# Patient Record
Sex: Male | Born: 1945 | Race: Black or African American | Hispanic: No | Marital: Single | Smoking: Never smoker
Health system: Southern US, Community
[De-identification: ages and names within clinical notes are randomized; demographics above are authoritative.]

## PROBLEM LIST (undated history)

## (undated) DIAGNOSIS — E119 Type 2 diabetes mellitus without complications: Secondary | ICD-10-CM

## (undated) DIAGNOSIS — D573 Sickle-cell trait: Secondary | ICD-10-CM

## (undated) DIAGNOSIS — I10 Essential (primary) hypertension: Secondary | ICD-10-CM

## (undated) DIAGNOSIS — I739 Peripheral vascular disease, unspecified: Secondary | ICD-10-CM

## (undated) DIAGNOSIS — M170 Bilateral primary osteoarthritis of knee: Secondary | ICD-10-CM

## (undated) HISTORY — DX: Essential (primary) hypertension: I10

## (undated) HISTORY — DX: Sickle-cell trait: D57.3

## (undated) HISTORY — DX: Bilateral primary osteoarthritis of knee: M17.0

---

## 2005-07-12 ENCOUNTER — Emergency Department (HOSPITAL_COMMUNITY): Admission: EM | Admit: 2005-07-12 | Discharge: 2005-07-12 | Payer: Self-pay | Admitting: Emergency Medicine

## 2007-03-06 ENCOUNTER — Emergency Department (HOSPITAL_COMMUNITY): Admission: EM | Admit: 2007-03-06 | Discharge: 2007-03-06 | Payer: Self-pay | Admitting: Emergency Medicine

## 2010-02-20 ENCOUNTER — Emergency Department (HOSPITAL_COMMUNITY): Admission: EM | Admit: 2010-02-20 | Discharge: 2010-02-20 | Payer: Self-pay | Admitting: Emergency Medicine

## 2011-06-03 ENCOUNTER — Emergency Department (HOSPITAL_COMMUNITY)
Admission: EM | Admit: 2011-06-03 | Discharge: 2011-06-04 | Disposition: A | Discharge status: Home / Self Care | Payer: Medicare Other | Attending: Emergency Medicine | Admitting: Emergency Medicine

## 2011-06-03 DIAGNOSIS — S61209A Unspecified open wound of unspecified finger without damage to nail, initial encounter: Secondary | ICD-10-CM | POA: Insufficient documentation

## 2011-06-03 DIAGNOSIS — W208XXA Other cause of strike by thrown, projected or falling object, initial encounter: Secondary | ICD-10-CM | POA: Insufficient documentation

## 2011-06-03 DIAGNOSIS — M79609 Pain in unspecified limb: Secondary | ICD-10-CM | POA: Insufficient documentation

## 2011-06-04 ENCOUNTER — Emergency Department (HOSPITAL_COMMUNITY): Payer: Medicare Other

## 2011-06-04 IMAGING — CR DG FINGER RING 2+V*R*
3 series · 3 of 3 positions shown · non-contrast
Comparison: None.

CLINICAL DATA: Blunt trauma of the fourth finger

RIGHT RING FINGER 2+V

[x finger pa right]
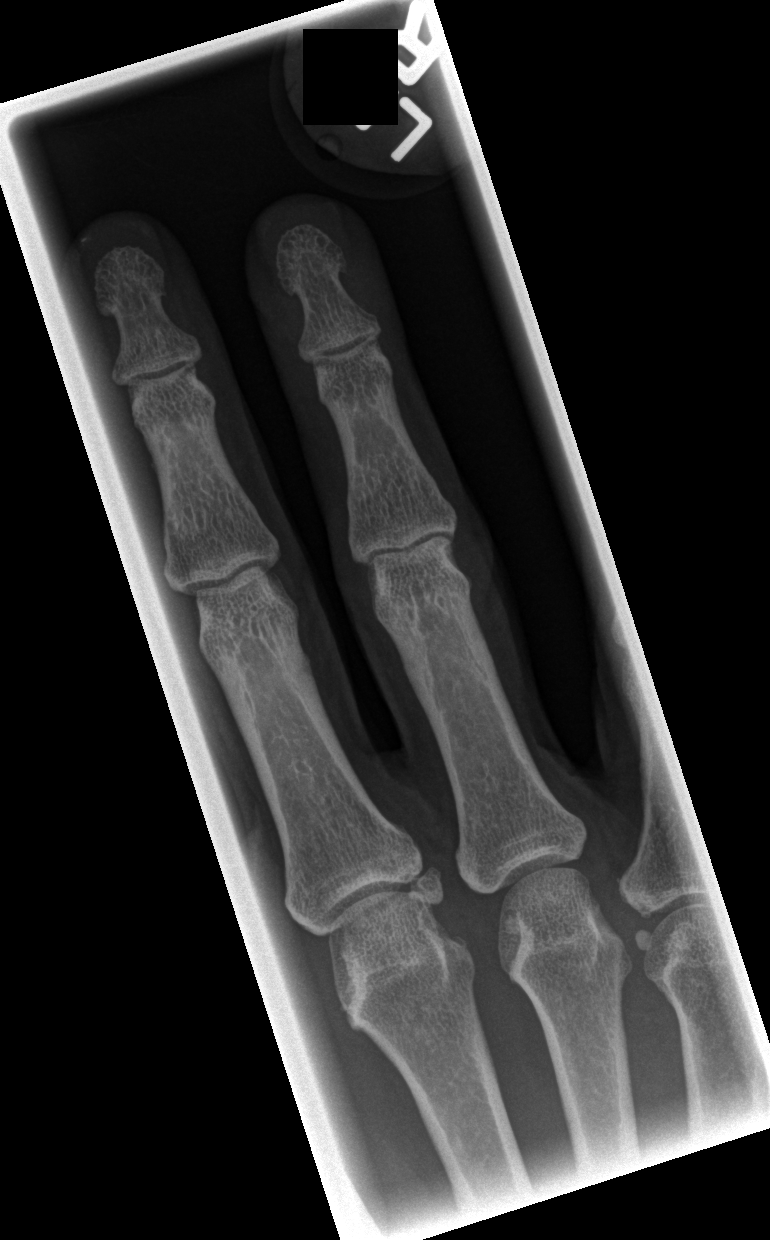

[x finger obl. right]
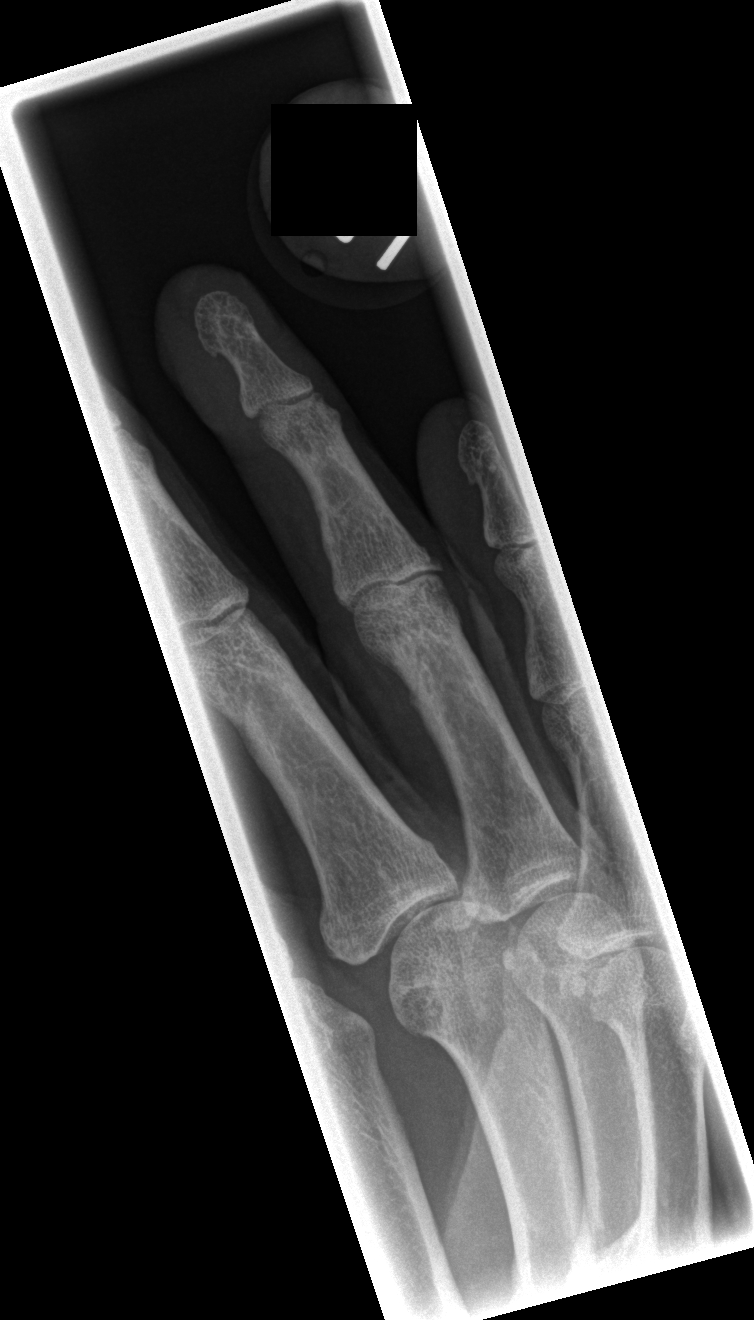

[x finger lateral right]
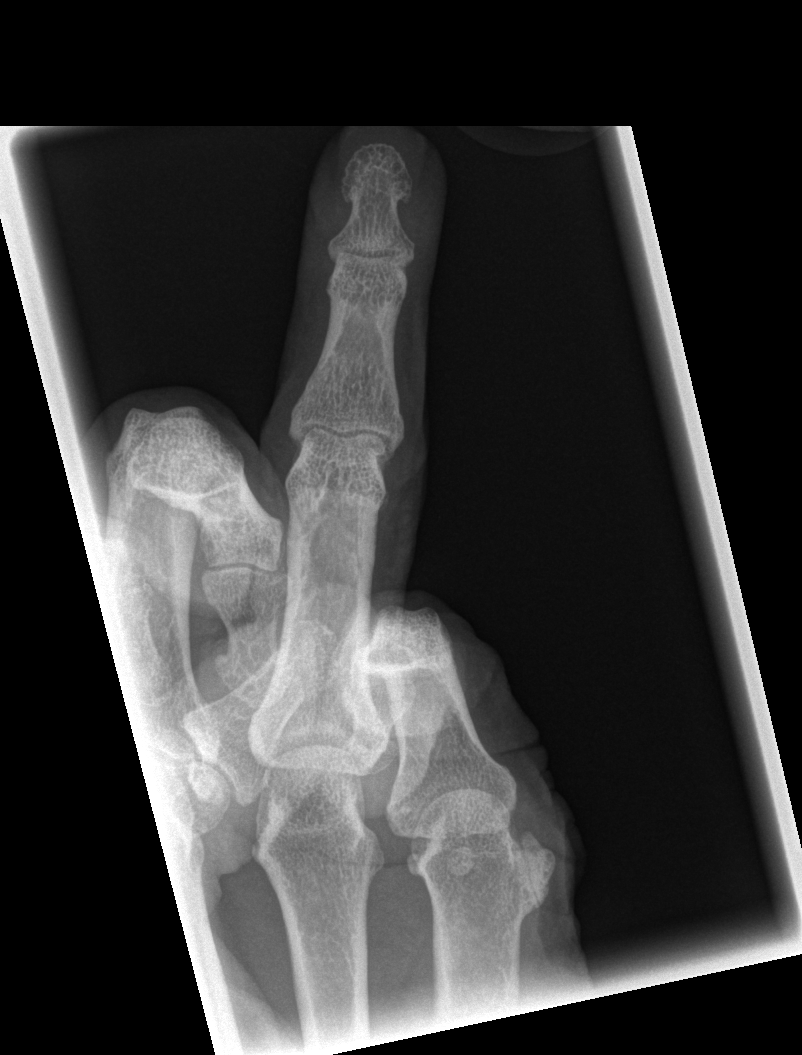

[3 of 3 positions shown; findings below may reference images not displayed]

FINDINGS: The joints of the finger are aligned.  No acute fracture
is identified.  No discrete focal soft tissue swelling, radiopaque
foreign body or soft tissue gas.
IMPRESSION: No acute bony abnormality identified.

## 2015-07-26 ENCOUNTER — Emergency Department (HOSPITAL_COMMUNITY)
Admission: EM | Admit: 2015-07-26 | Discharge: 2015-07-26 | Disposition: A | Discharge status: Home / Self Care | Payer: Medicare HMO | Attending: Emergency Medicine | Admitting: Emergency Medicine

## 2015-07-26 ENCOUNTER — Emergency Department (HOSPITAL_COMMUNITY): Payer: Medicare HMO

## 2015-07-26 ENCOUNTER — Encounter (HOSPITAL_COMMUNITY): Payer: Self-pay | Admitting: Emergency Medicine

## 2015-07-26 DIAGNOSIS — J069 Acute upper respiratory infection, unspecified: Secondary | ICD-10-CM | POA: Insufficient documentation

## 2015-07-26 DIAGNOSIS — R300 Dysuria: Secondary | ICD-10-CM | POA: Diagnosis present

## 2015-07-26 DIAGNOSIS — N390 Urinary tract infection, site not specified: Secondary | ICD-10-CM | POA: Diagnosis not present

## 2015-07-26 LAB — CBC
HCT: 42 % (ref 39.0–52.0)
HEMOGLOBIN: 13.4 g/dL (ref 13.0–17.0)
MCH: 28.3 pg (ref 26.0–34.0)
MCHC: 31.9 g/dL (ref 30.0–36.0)
MCV: 88.6 fL (ref 78.0–100.0)
Platelets: 270 10*3/uL (ref 150–400)
RBC: 4.74 MIL/uL (ref 4.22–5.81)
RDW: 12.8 % (ref 11.5–15.5)
WBC: 24.5 10*3/uL — AB (ref 4.0–10.5)

## 2015-07-26 LAB — URINALYSIS, ROUTINE W REFLEX MICROSCOPIC
Bilirubin Urine: NEGATIVE
Glucose, UA: NEGATIVE mg/dL
Ketones, ur: NEGATIVE mg/dL
Nitrite: NEGATIVE
Protein, ur: 30 mg/dL — AB
Specific Gravity, Urine: 1.018 (ref 1.005–1.030)
pH: 5.5 (ref 5.0–8.0)

## 2015-07-26 LAB — BASIC METABOLIC PANEL
ANION GAP: 11 (ref 5–15)
BUN: 13 mg/dL (ref 6–20)
CHLORIDE: 100 mmol/L — AB (ref 101–111)
CO2: 27 mmol/L (ref 22–32)
Calcium: 8.9 mg/dL (ref 8.9–10.3)
Creatinine, Ser: 1.37 mg/dL — ABNORMAL HIGH (ref 0.61–1.24)
GFR calc non Af Amer: 51 mL/min — ABNORMAL LOW (ref >60)
GFR, EST AFRICAN AMERICAN: 59 mL/min — AB (ref >60)
Glucose, Bld: 125 mg/dL — ABNORMAL HIGH (ref 65–99)
Potassium: 3.7 mmol/L (ref 3.5–5.1)
SODIUM: 138 mmol/L (ref 135–145)

## 2015-07-26 LAB — URINE MICROSCOPIC-ADD ON

## 2015-07-26 IMAGING — CR DG CHEST 2V
2 series · 2 of 2 positions shown · non-contrast
Comparison: None.

CLINICAL DATA: Productive cough

EXAM:
CHEST  2 VIEW

[w chest pa]
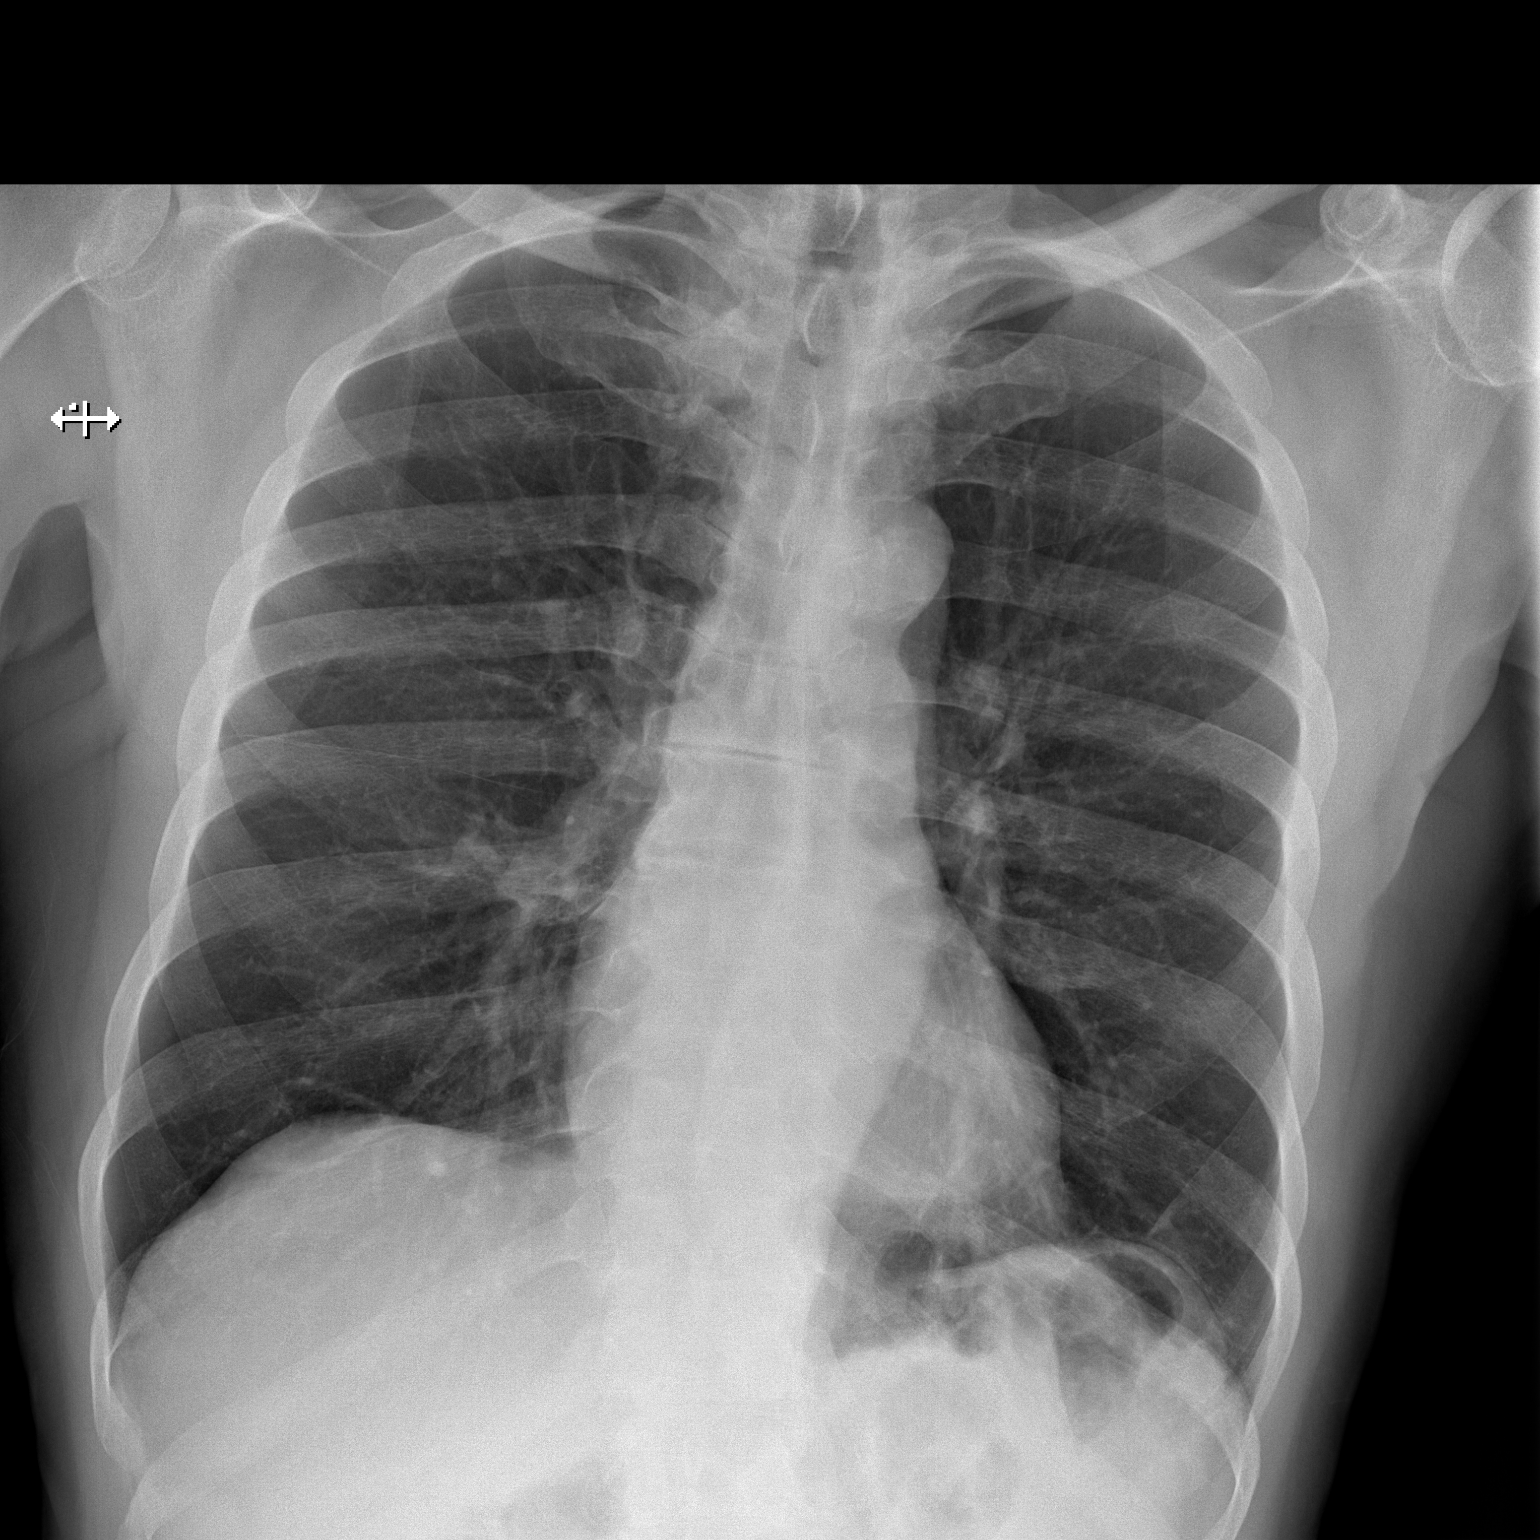

[w chest lat]
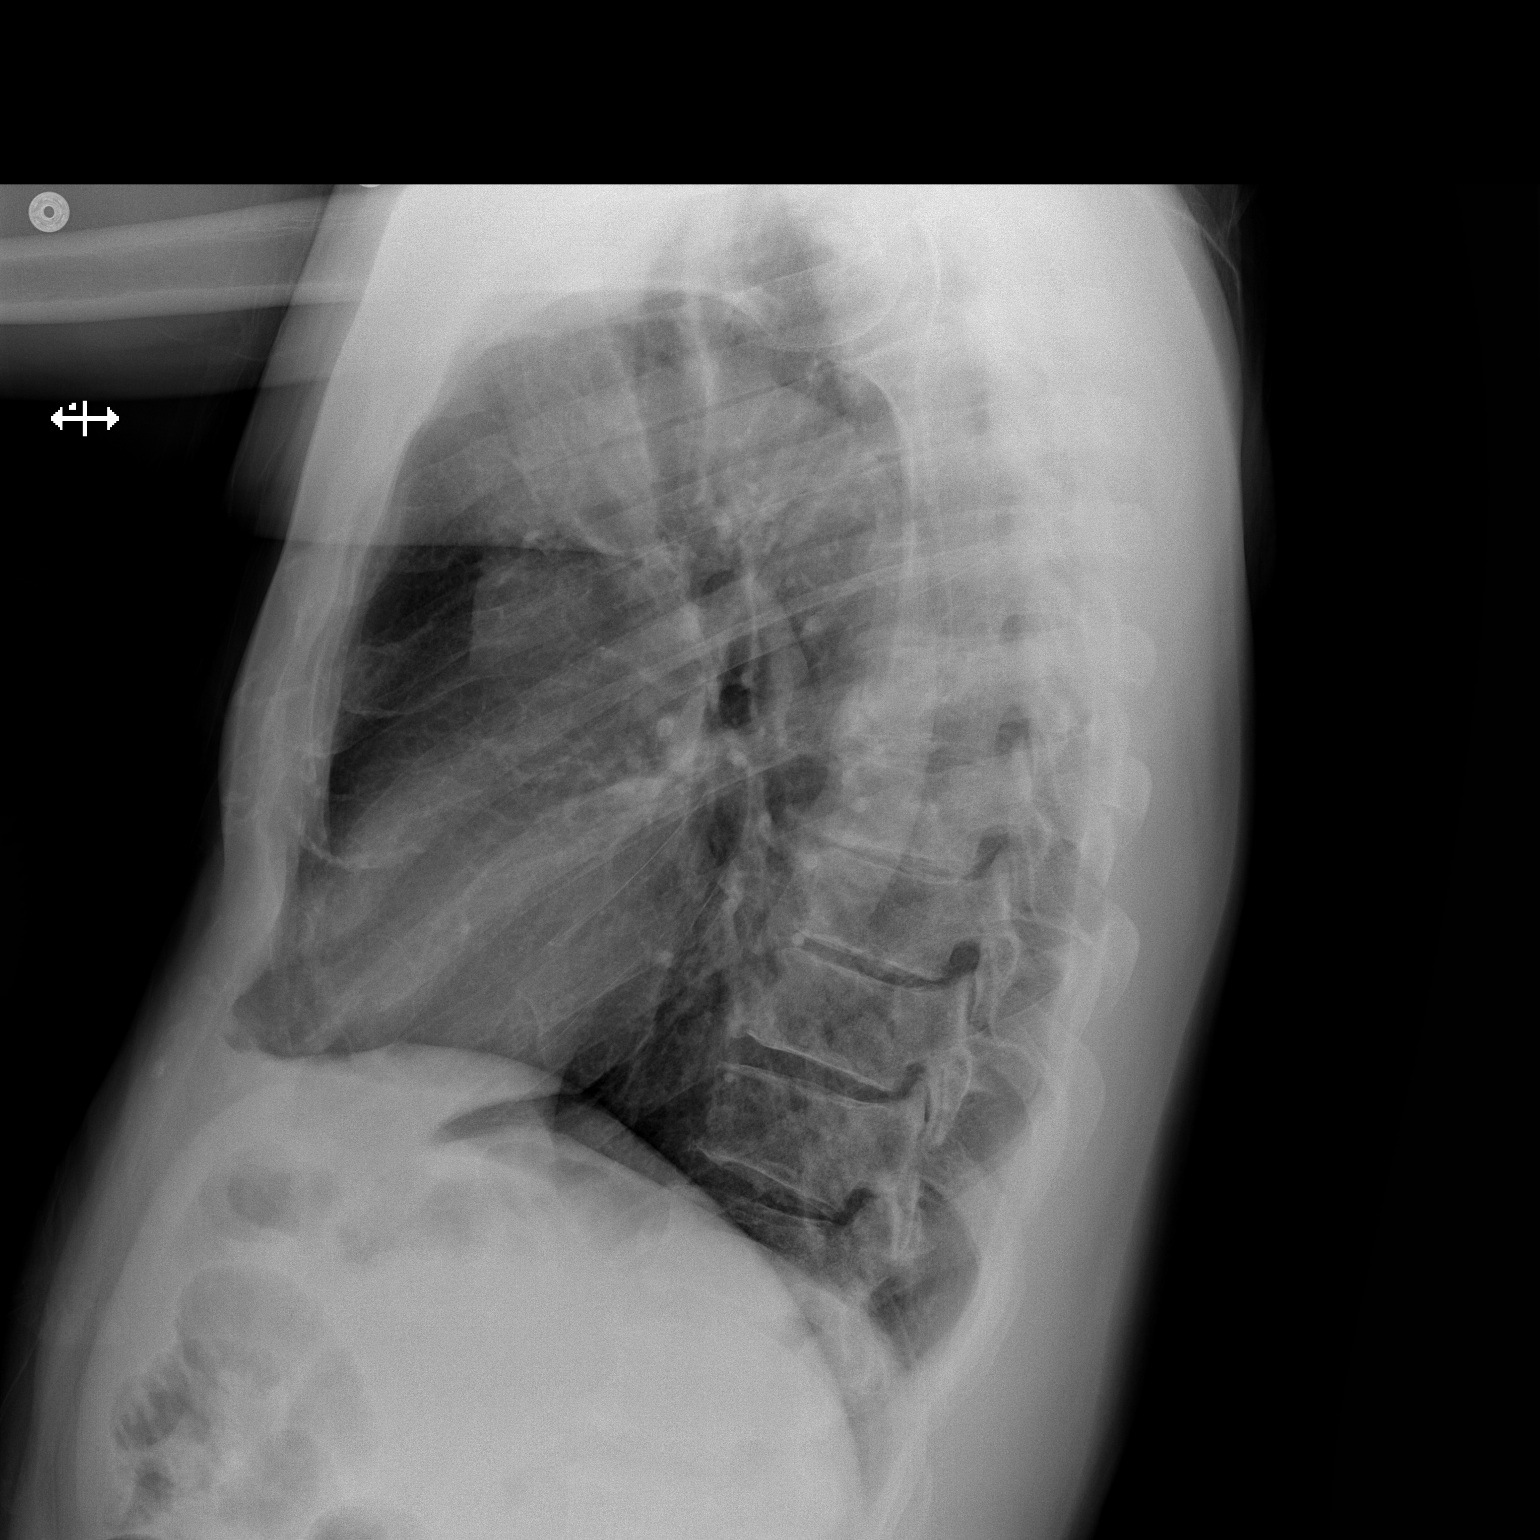

[2 of 2 positions shown; findings below may reference images not displayed]

FINDINGS: The heart size and mediastinal contours are within normal limits.
Both lungs are clear. The visualized skeletal structures are
unremarkable.
IMPRESSION: No active cardiopulmonary disease.

## 2015-07-26 MED ORDER — PROMETHAZINE-DM 6.25-15 MG/5ML PO SYRP
5.0000 mL | ORAL_SOLUTION | Freq: Four times a day (QID) | ORAL | Status: DC | PRN
Start: 2015-07-26 — End: 2017-12-21

## 2015-07-26 MED ORDER — DEXTROSE 5 % IV SOLN
1.0000 g | Freq: Once | INTRAVENOUS | Status: AC
  Administered 2015-07-26: 1 g via INTRAVENOUS
  Filled 2015-07-26: qty 10

## 2015-07-26 MED ORDER — GUAIFENESIN ER 1200 MG PO TB12: 1.0000 | ORAL_TABLET | Freq: Two times a day (BID) | ORAL | Status: DC

## 2015-07-26 MED ORDER — LEVOFLOXACIN 750 MG PO TABS: 750.0000 mg | ORAL_TABLET | Freq: Every day | ORAL | Status: DC

## 2015-07-26 NOTE — Discharge Instructions (Signed)
Return here as needed.  Follow-up with your primary care doctor, increase your fluid intake and rest as much as possible °

## 2015-07-26 NOTE — ED Notes (Signed)
Family at bedside. 

## 2015-07-26 NOTE — ED Provider Notes (Signed)
CSN: [id number]     Arrival date & time 07/26/15  1456 History   First MD Initiated Contact with Patient 07/26/15 1543     Chief Complaint  Patient presents with  . Dysuria     (Consider location/radiation/quality/duration/timing/severity/associated sxs/prior Treatment) HPI Patient presents to the emergency department with upper respiratory illness that includes cough, nasal congestion, and chills.  The patient states he also has some incontinence and dysuria.  Patient states that he has pain with urination.  Also, states that he did not take any medications prior to arrival.  He states nothing seems make his condition, better or worse.  The patient denies chest pain, shortness of breath, nausea, vomiting, weakness, dizziness, headache, blurred vision, abdominal pain, back pain, rash, lightheadedness, anorexia, numbness, or syncope History reviewed. No pertinent past medical history. History reviewed. No pertinent past surgical history. No family history on file. Social History  Substance Use Topics  . Smoking status: Never Smoker   . Smokeless tobacco: None  . Alcohol Use: No    Review of Systems  All other systems negative except as documented in the HPI. All pertinent positives and negatives as reviewed in the HPI.  Allergies  Review of patient's allergies indicates no known allergies.  Home Medications   Prior to Admission medications   Medication Sig Start Date End Date Taking? Authorizing Provider  cyclobenzaprine (FLEXERIL) 10 MG tablet TK 1 T PO BID PRN P 07/04/15  Yes Historical Provider, MD  HYDROcodone-ibuprofen (VICOPROFEN) 7.5-200 MG tablet TK 1 T PO QID PRN P 07/04/15  Yes Historical Provider, MD  ibuprofen (ADVIL,MOTRIN) 200 MG tablet Take 400 mg by mouth every 6 (six) hours as needed for moderate pain.   Yes Historical Provider, MD   BP 116/54 mmHg  Pulse 66  Temp(Src) 98 F (36.7 C) (Oral)  Resp 20  SpO2 96% Physical Exam  Constitutional: He is oriented  to person, place, and time. He appears well-developed and well-nourished. No distress.  HENT:  Head: Normocephalic and atraumatic.  Mouth/Throat: Oropharynx is clear and moist.  Eyes: Pupils are equal, round, and reactive to light.  Neck: Normal range of motion. Neck supple.  Cardiovascular: Normal rate, regular rhythm and normal heart sounds.  Exam reveals no gallop and no friction rub.   No murmur heard. Pulmonary/Chest: Effort normal and breath sounds normal. No respiratory distress. He has no wheezes.  Abdominal: Soft. Bowel sounds are normal. He exhibits no distension. There is no tenderness.  Neurological: He is alert and oriented to person, place, and time. He exhibits normal muscle tone. Coordination normal.  Skin: Skin is warm and dry. No rash noted. No erythema.  Psychiatric: He has a normal mood and affect. His behavior is normal.  Nursing note and vitals reviewed.   ED Course  Procedures (including critical care time) Labs Review Labs Reviewed  BASIC METABOLIC PANEL - Abnormal; Notable for the following:    Chloride 100 (*)    Glucose, Bld 125 (*)    Creatinine, Ser 1.37 (*)    GFR calc non Af Amer 51 (*)    GFR calc Af Amer 59 (*)    All other components within normal limits  CBC - Abnormal; Notable for the following:    WBC 24.5 (*)    All other components within normal limits  URINALYSIS, ROUTINE W REFLEX MICROSCOPIC (NOT AT [facility]) - Abnormal; Notable for the following:    Color, Urine AMBER (*)    APPearance TURBID (*)    Hgb  urine dipstick LARGE (*)    Protein, ur 30 (*)    Leukocytes, UA LARGE (*)    All other components within normal limits  URINE MICROSCOPIC-ADD ON - Abnormal; Notable for the following:    Squamous Epithelial / LPF 0-5 (*)    Bacteria, UA FEW (*)    All other components within normal limits  URINE CULTURE    Imaging Review Dg Chest 2 View  07/26/2015  CLINICAL DATA:  Productive cough EXAM: CHEST  2 VIEW COMPARISON:  None. FINDINGS:  The heart size and mediastinal contours are within normal limits. Both lungs are clear. The visualized skeletal structures are unremarkable. IMPRESSION: No active cardiopulmonary disease. Electronically Signed   By: Marlan Palauharles  Clark M.D.   On: 07/26/2015 16:47   I have personally reviewed and evaluated these images and lab results as part of my medical decision-making.  Patient be treated for his upper respiratory infection along with his urine.  The patient is advised to return here as needed.  Told to increase his fluid intake and rest as much as possible.  Patient agrees the plan and all questions were answered   Charlestine NightChristopher Yaileen Hofferber, PA-C 07/26/15 1855  Mancel BaleElliott Wentz, MD 07/26/15 [phone]

## 2015-07-26 NOTE — ED Provider Notes (Signed)
  Face-to-face evaluation   History: He has been ill for several days with nonproductive cough, and urinary incontinence. No dysuria, hematuria, weakness or dizziness. He has had a low-grade temperature. No prior similar problems.  Physical exam: Alert, vigorous elderly male. Heart regular rate and rhythm, no murmur. Lungs clear. Abdomen soft, nontender. Back no costovertebral angle tenderness  Medical screening examination/treatment/procedure(s) were conducted as a shared visit with non-physician practitioner(s) and myself.  I personally evaluated the patient during the encounter  Mancel BaleElliott Shawnette Augello, MD 07/26/15 [phone]

## 2015-07-26 NOTE — ED Notes (Signed)
Per pt, states cold symptoms and urinary incontinence for 2 days

## 2015-07-28 LAB — URINE CULTURE: Culture: >=100000

## 2015-07-30 ENCOUNTER — Telehealth (HOSPITAL_COMMUNITY): Payer: Self-pay

## 2015-07-30 NOTE — Telephone Encounter (Signed)
Post ED Visit - Positive Culture Follow-up  Culture report reviewed by antimicrobial stewardship pharmacist:  []  Enzo BiNathan Batchelder, Pharm.D. []  Celedonio MiyamotoJeremy Frens, Pharm.D., BCPS []  Garvin FilaMike Maccia, Pharm.D. []  Georgina PillionElizabeth Martin, Pharm.D., BCPS []  RousevilleMinh Pham, [address] BoulevardPharm.D., BCPS, AAHIVP []  Estella HuskMichelle Turner, Pharm.D., BCPS, AAHIVP [x]  Tennis Mustassie Stewart, Pharm.D. []  Sherle Poeob Vincent, [place]arm[place]D.  Positive urine culture, >/=100,000 colonies -> E Coli Treated with Levofloxacin, organism sensitive to the same and no further patient follow-up is required at this time.  Arvid RightClark, Murlene Revell Dorn 07/30/2015, 3:19 AM

## 2016-08-16 ENCOUNTER — Emergency Department (HOSPITAL_COMMUNITY): Payer: Medicare HMO

## 2016-08-16 ENCOUNTER — Emergency Department (HOSPITAL_COMMUNITY)
Admission: EM | Admit: 2016-08-16 | Discharge: 2016-08-16 | Disposition: A | Discharge status: Home / Self Care | Payer: Medicare HMO | Attending: Emergency Medicine | Admitting: Emergency Medicine

## 2016-08-16 ENCOUNTER — Encounter (HOSPITAL_COMMUNITY): Payer: Self-pay | Admitting: Emergency Medicine

## 2016-08-16 DIAGNOSIS — Y999 Unspecified external cause status: Secondary | ICD-10-CM | POA: Insufficient documentation

## 2016-08-16 DIAGNOSIS — Y929 Unspecified place or not applicable: Secondary | ICD-10-CM | POA: Insufficient documentation

## 2016-08-16 DIAGNOSIS — X501XXA Overexertion from prolonged static or awkward postures, initial encounter: Secondary | ICD-10-CM | POA: Insufficient documentation

## 2016-08-16 DIAGNOSIS — S8992XA Unspecified injury of left lower leg, initial encounter: Secondary | ICD-10-CM | POA: Diagnosis present

## 2016-08-16 DIAGNOSIS — Y939 Activity, unspecified: Secondary | ICD-10-CM | POA: Diagnosis not present

## 2016-08-16 DIAGNOSIS — S8392XA Sprain of unspecified site of left knee, initial encounter: Secondary | ICD-10-CM

## 2016-08-16 IMAGING — CR DG KNEE COMPLETE 4+V*L*
4 series · 4 of 4 positions shown · non-contrast
Comparison: None.

CLINICAL DATA: Patient status post fall with knee pain. Initial
encounter.

EXAM:
LEFT KNEE - COMPLETE 4+ VIEW

[t knee ap left]
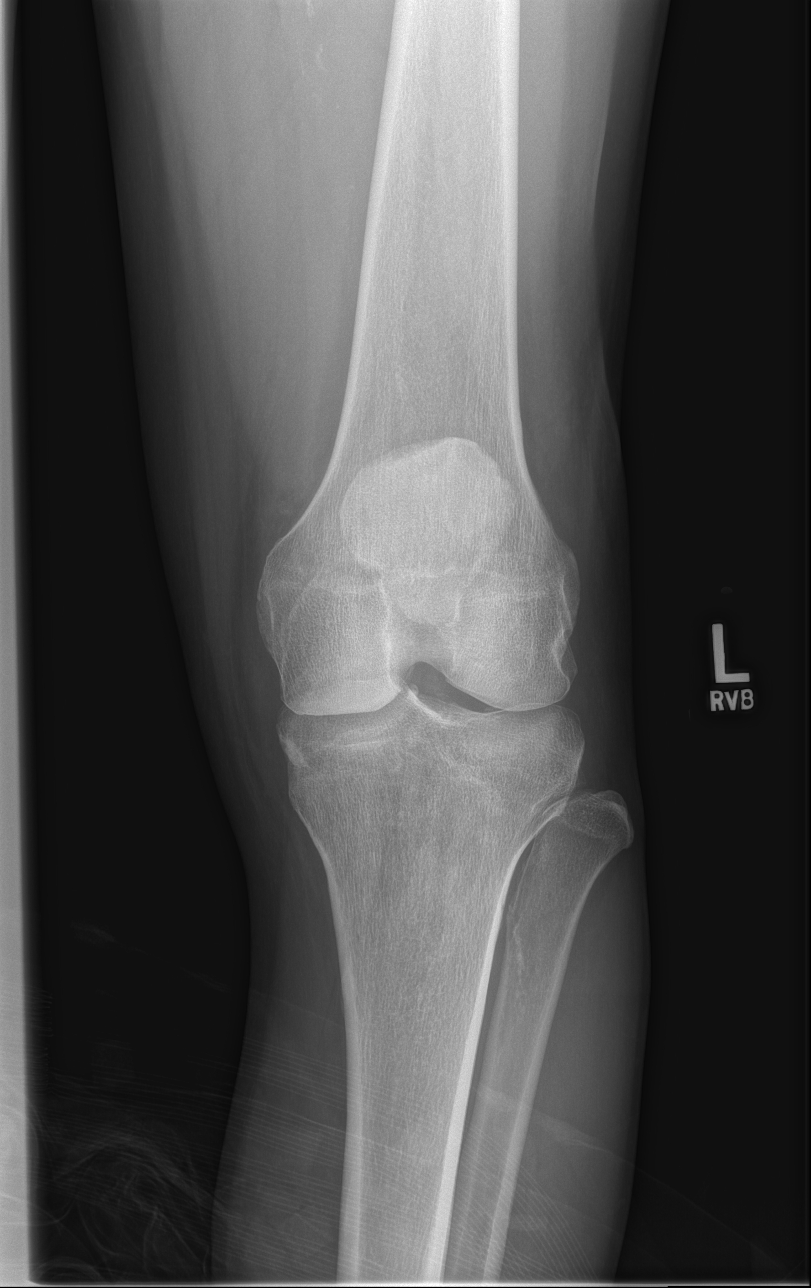

[t knee obl left (1 of 2)]
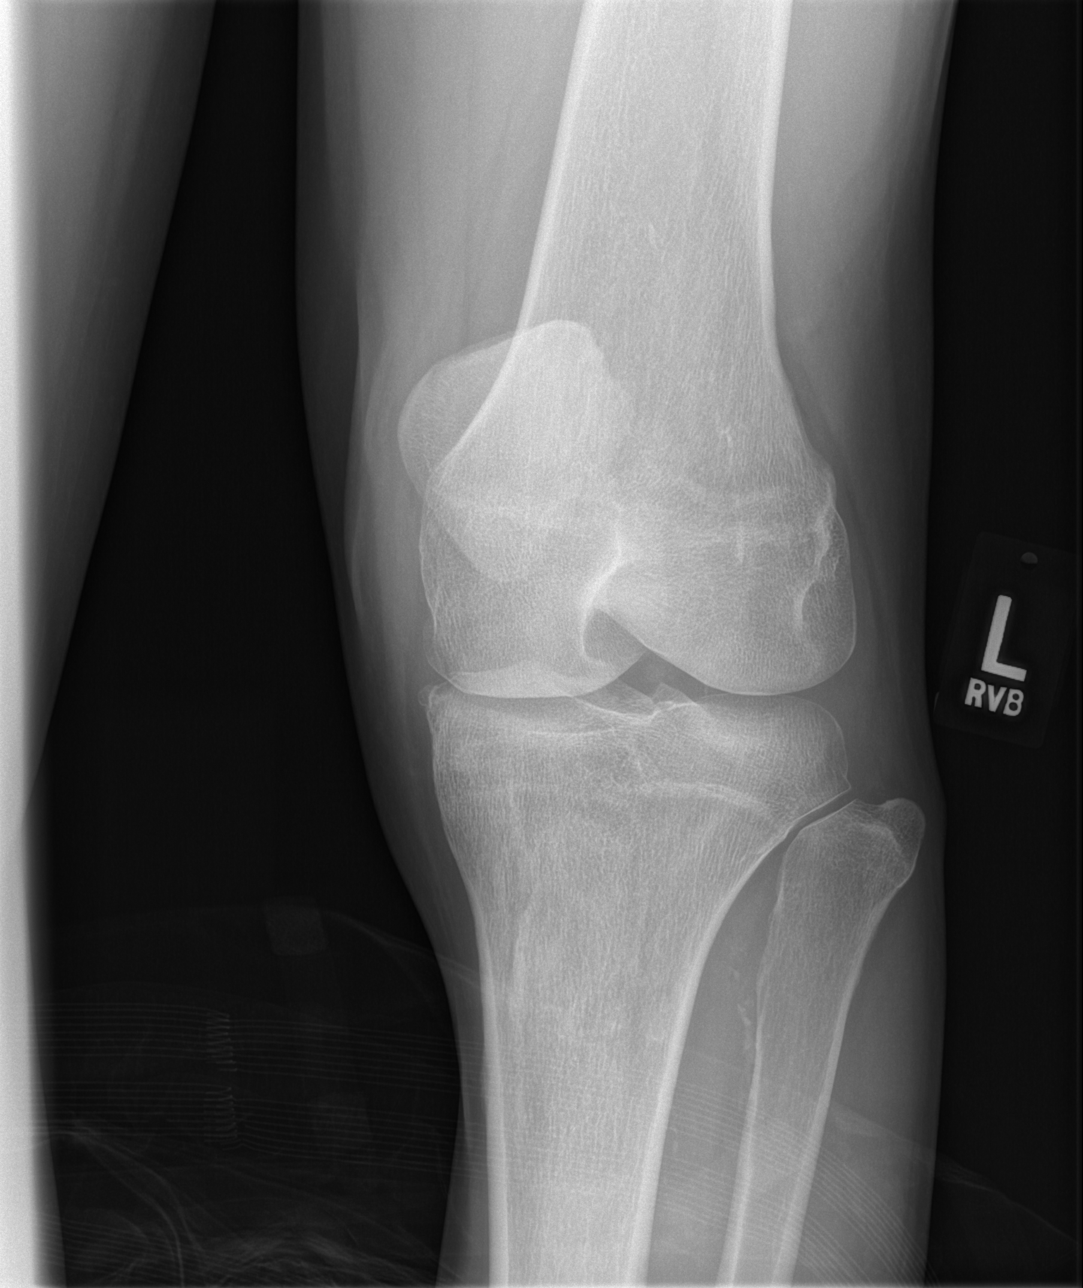

[t knee obl left (2 of 2)]
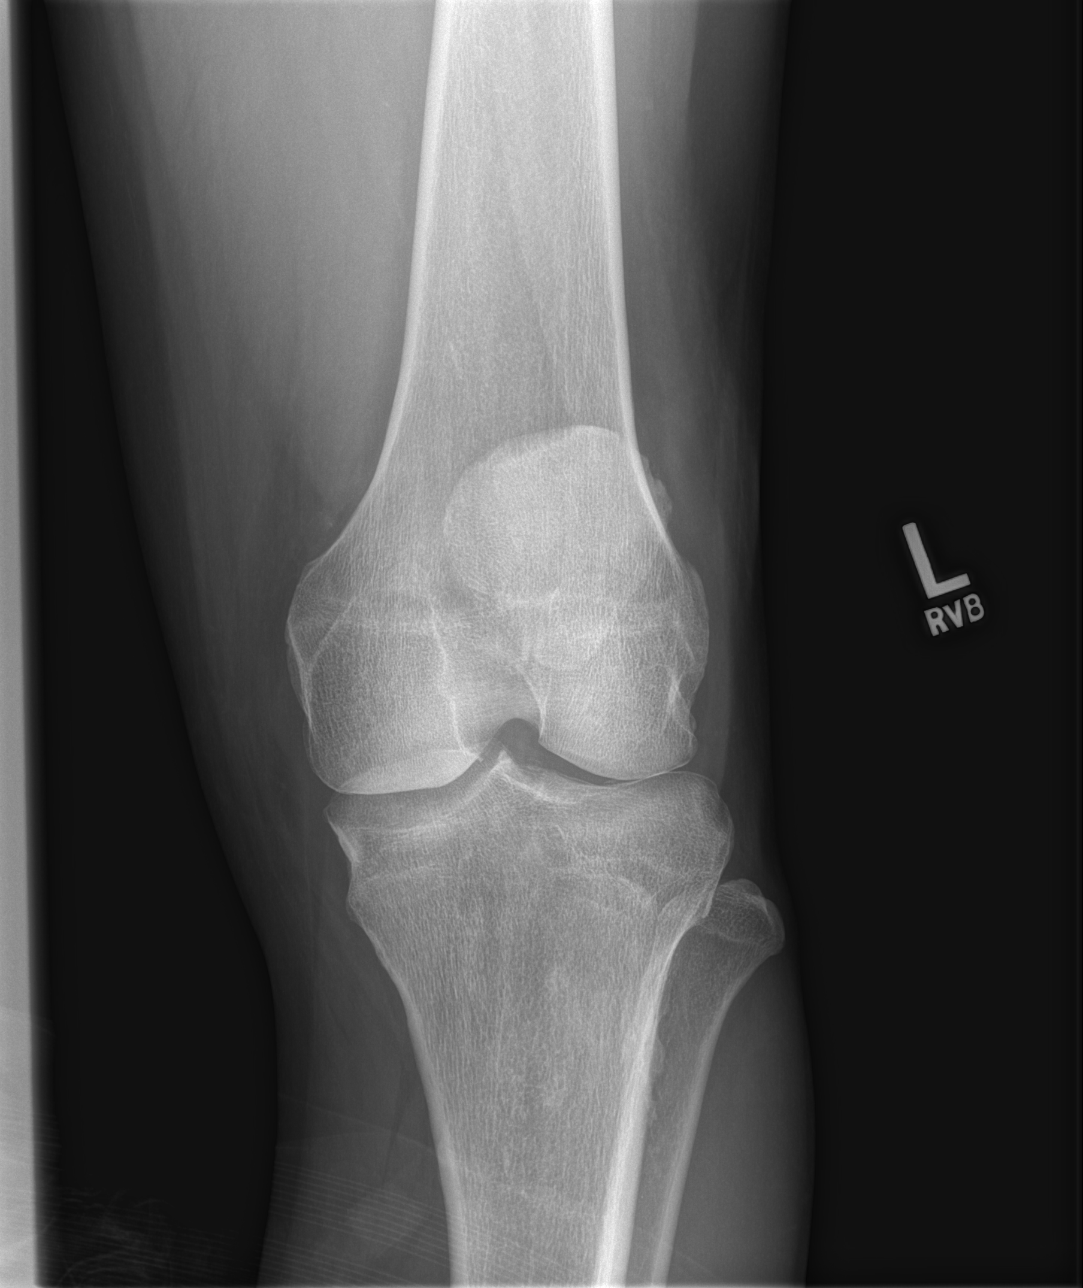

[t knee lat left]
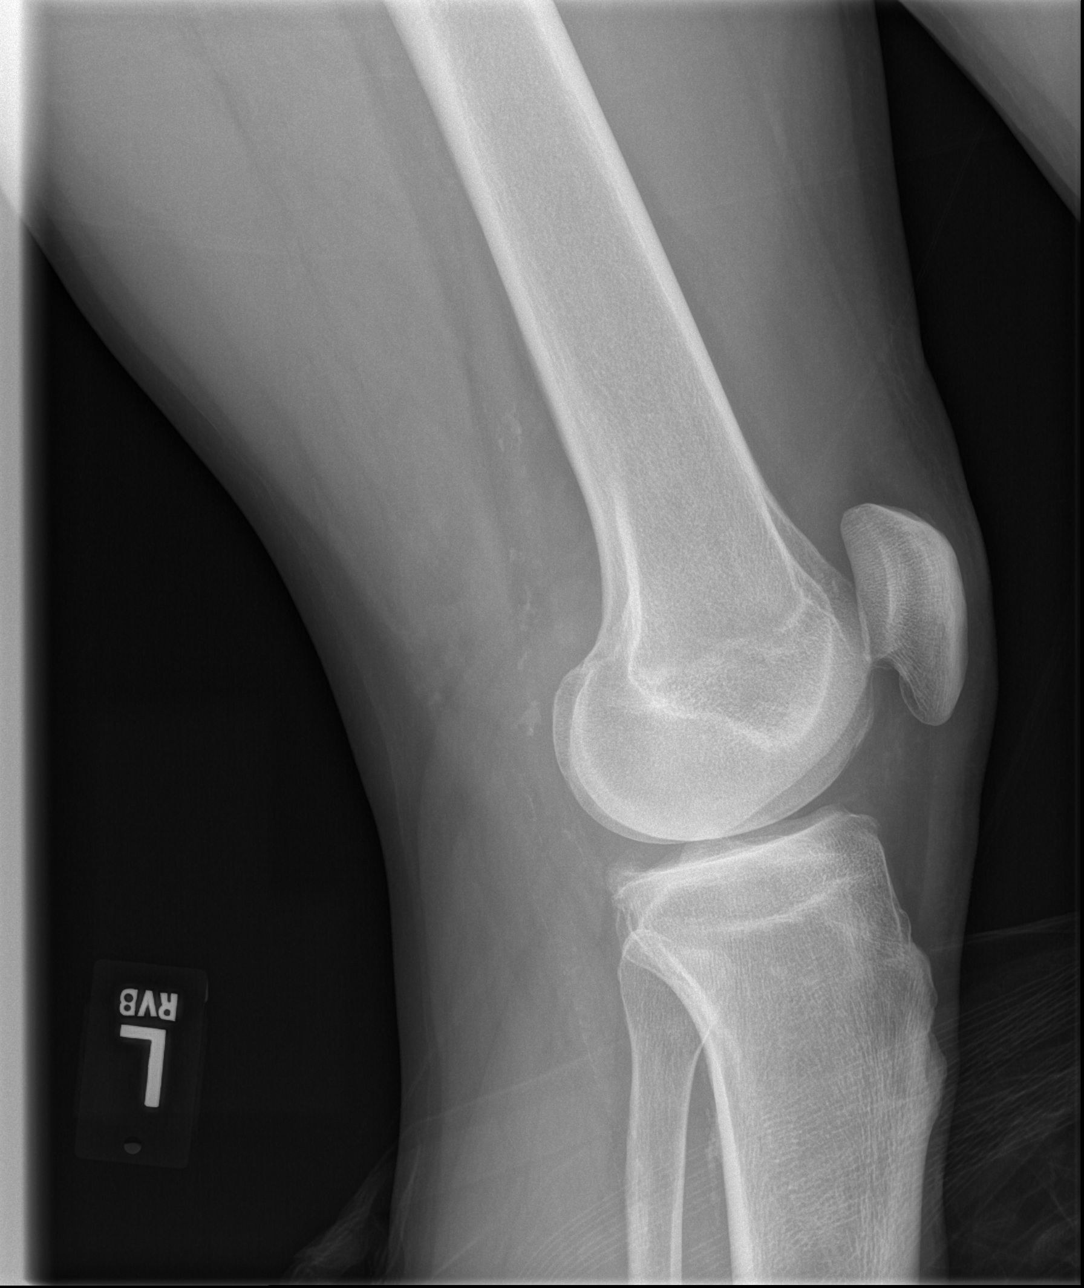

[4 of 4 positions shown; findings below may reference images not displayed]

FINDINGS: Normal anatomic alignment. Medial compartment joint space narrowing.
No evidence for acute fracture or dislocation. No joint effusion.
Vascular calcifications.
IMPRESSION: Degenerative changes.  No acute osseous abnormality.

## 2016-08-16 MED ORDER — TRAMADOL HCL 50 MG PO TABS: 50.0000 mg | ORAL_TABLET | Freq: Four times a day (QID) | ORAL | 0 refills | Status: DC | PRN

## 2016-08-16 MED ORDER — CYCLOBENZAPRINE HCL 10 MG PO TABS: ORAL_TABLET | ORAL | 0 refills | Status: DC

## 2016-08-16 NOTE — ED Triage Notes (Signed)
Patient reports left knee pain after "twisting it on the job site" x2 days ago. Ambulatory to triage.

## 2016-08-16 NOTE — ED Provider Notes (Signed)
[facility]-EMERGENCY DEPT Provider Note   CSN: [id number] Arrival date & time: 08/16/16  1254  By signing my name below, I, Teofilo PodMatthew P. Jamison, attest that this documentation has been prepared under the direction and in the presence of Fayrene HelperBowie Bryden Darden, PA-C. Electronically Signed: Teofilo PodMatthew P. Jamison, ED Scribe. 08/16/2016. 1:08 PM.    History   Chief Complaint Chief Complaint  Patient presents with  . Knee Pain    The history is provided by the patient. No language interpreter was used.   HPI Comments:  Aaron Mendez is a 70 y.o. male who presents to the Emergency Department s/p left knee injury that occurred 2 days ago. Pt reports that he was standing on a bucket pulling nails out of a wall and the bucket slipped out from under him, causing him to twist his left knee. Pt did not hit his head. Pt rates that pain at 5/10 and describes the pain as sharp and constant. Pt has been ambulatory. Pt denies other injuries. Pt took advil and used rubbing alcohol with no relief. Pt denies lightheadedness, dizziness, chest pain, LOC, numbness, back pain.   History reviewed. No pertinent past medical history.  There are no active problems to display for this patient.   History reviewed. No pertinent surgical history.     Home Medications    Prior to Admission medications   Medication Sig Start Date End Date Taking? Authorizing Provider  cyclobenzaprine (FLEXERIL) 10 MG tablet TK 1 T PO BID PRN P 07/04/15   Historical Provider, MD  Guaifenesin 1200 MG TB12 Take 1 tablet (1,200 mg total) by mouth 2 (two) times daily. 07/26/15   Christopher Lawyer, PA-C  HYDROcodone-ibuprofen (VICOPROFEN) 7.5-200 MG tablet TK 1 T PO QID PRN P 07/04/15   Historical Provider, MD  ibuprofen (ADVIL,MOTRIN) 200 MG tablet Take 400 mg by mouth every 6 (six) hours as needed for moderate pain.    Historical Provider, MD  levofloxacin (LEVAQUIN) 750 MG tablet Take 1 tablet (750 mg total) by mouth daily. 07/26/15   Charlestine Nighthristopher  Lawyer, PA-C  promethazine-dextromethorphan (PROMETHAZINE-DM) 6.25-15 MG/5ML syrup Take 5 mLs by mouth 4 (four) times daily as needed for cough. 07/26/15   Charlestine Nighthristopher Lawyer, PA-C    Family History No family history on file.  Social History Social History  Substance Use Topics  . Smoking status: Never Smoker  . Smokeless tobacco: Not on file  . Alcohol use No     Allergies   Patient has no known allergies.   Review of Systems Review of Systems  Cardiovascular: Negative for chest pain.  Musculoskeletal: Positive for arthralgias. Negative for back pain.  Neurological: Negative for dizziness, syncope, light-headedness and numbness.     Physical Exam Updated Vital Signs BP 171/88 (BP Location: Left Arm)   Pulse (!) 53   Temp 97.9 F (36.6 C) (Oral)   Resp 18   Ht 5\' 11"  (1.803 m)   Wt 165 lb 4.8 oz (75 kg)   SpO2 100%   BMI 23.05 kg/m   Physical Exam  Constitutional: He appears well-developed and well-nourished. No distress.  HENT:  Head: Normocephalic and atraumatic.  Eyes: Conjunctivae are normal.  Cardiovascular: Normal rate.   Pulmonary/Chest: Effort normal.  Abdominal: He exhibits no distension.  Musculoskeletal:       Left knee: He exhibits effusion. He exhibits normal range of motion, no erythema, no LCL laxity and no MCL laxity. Tenderness found. Medial joint line tenderness noted.  No Baker's cyst. No warmth.   Neurological: He  is alert.  Skin: Skin is warm and dry.  Psychiatric: He has a normal mood and affect.  Nursing note and vitals reviewed.    ED Treatments / Results  DIAGNOSTIC STUDIES:  Oxygen Saturation is 100% on RA, normal by my interpretation.    COORDINATION OF CARE:  1:08 PM Discussed treatment plan with pt at bedside and pt agreed to plan.   Labs (all labs ordered are listed, but only abnormal results are displayed) Labs Reviewed - No data to display  EKG  EKG Interpretation None       Radiology Dg Knee Complete 4  Views Left  Result Date: 08/16/2016 CLINICAL DATA:  Patient status post fall with knee pain. Initial encounter. EXAM: LEFT KNEE - COMPLETE 4+ VIEW COMPARISON:  None. FINDINGS: Normal anatomic alignment. Medial compartment joint space narrowing. No evidence for acute fracture or dislocation. No joint effusion. Vascular calcifications. IMPRESSION: Degenerative changes.  No acute osseous abnormality. Electronically Signed   By: Annia Beltrew  Davis M.D.   On: 08/16/2016 13:29    Procedures Procedures (including critical care time)  Medications Ordered in ED Medications - No data to display   Initial Impression / Assessment and Plan / ED Course  Patient X-Ray negative for obvious fracture or dislocation.  Pt advised to follow up with orthopedics. Patient given knee sleeve while in ED, conservative therapy recommended and discussed. Patient will be discharged home & is agreeable with above plan. Returns precautions discussed. Pt appears safe for discharge.  I have reviewed the triage vital signs and the nursing notes.  Pertinent labs & imaging results that were available during my care of the patient were reviewed by me and considered in my medical decision making (see chart for details).  Clinical Course     BP 171/88 (BP Location: Left Arm)   Pulse (!) 53   Temp 97.9 F (36.6 C) (Oral)   Resp 18   Ht 5\' 11"  (1.803 m)   Wt 75 kg   SpO2 100%   BMI 23.05 kg/m    Final Clinical Impressions(s) / ED Diagnoses   Final diagnoses:  Sprain of left knee, unspecified ligament, initial encounter    New Prescriptions New Prescriptions   TRAMADOL (ULTRAM) 50 MG TABLET    Take 1 tablet (50 mg total) by mouth every 6 (six) hours as needed.   I personally performed the services described in this documentation, which was scribed in my presence. The recorded information has been reviewed and is accurate.       Fayrene HelperBowie Von Quintanar, PA-C 08/16/16 1342    Lorre NickAnthony Allen, MD 08/16/16 [phone]

## 2016-08-16 NOTE — ED Notes (Signed)
Patient transported to X-ray 

## 2016-12-22 ENCOUNTER — Ambulatory Visit (INDEPENDENT_AMBULATORY_CARE_PROVIDER_SITE_OTHER): Payer: Medicare PPO | Admitting: Orthopaedic Surgery

## 2016-12-22 ENCOUNTER — Ambulatory Visit (INDEPENDENT_AMBULATORY_CARE_PROVIDER_SITE_OTHER): Payer: Medicare PPO

## 2016-12-22 ENCOUNTER — Encounter (INDEPENDENT_AMBULATORY_CARE_PROVIDER_SITE_OTHER): Payer: Self-pay

## 2016-12-22 ENCOUNTER — Encounter (INDEPENDENT_AMBULATORY_CARE_PROVIDER_SITE_OTHER): Payer: Self-pay | Admitting: Orthopaedic Surgery

## 2016-12-22 DIAGNOSIS — G8929 Other chronic pain: Secondary | ICD-10-CM

## 2016-12-22 DIAGNOSIS — M25562 Pain in left knee: Secondary | ICD-10-CM

## 2016-12-22 MED ORDER — METHYLPREDNISOLONE ACETATE 40 MG/ML IJ SUSP
40.0000 mg | INTRAMUSCULAR | Status: AC | PRN
  Administered 2016-12-22: 40 mg via INTRA_ARTICULAR

## 2016-12-22 MED ORDER — BUPIVACAINE HCL 0.5 % IJ SOLN
2.0000 mL | INTRAMUSCULAR | Status: AC | PRN
  Administered 2016-12-22: 2 mL via INTRA_ARTICULAR

## 2016-12-22 MED ORDER — LIDOCAINE HCL 1 % IJ SOLN
2.0000 mL | INTRAMUSCULAR | Status: AC | PRN
  Administered 2016-12-22: 2 mL

## 2016-12-22 NOTE — Progress Notes (Signed)
Office Visit Note   Patient: Aaron Mendez           Date of Birth: 1945/10/28           MRN: [id number] Visit Date: 12/22/2016              Requested by: Billee Cashing, MD [address] [place], [place] [address] PCP: Billee Cashing, MD   Assessment & Plan: Visit Diagnoses:  1. Chronic pain of left knee     Plan: Left knee pain possible degenerative meniscal tear. Knee injection was performed today. Patient tolerated as well as not better we'll need to order advanced imaging  Follow-Up Instructions: Return if symptoms worsen or fail to improve.   Orders:  Orders Placed This Encounter  Procedures  . XR KNEE 3 VIEW LEFT   No orders of the defined types were placed in this encounter.     Procedures: Large Joint Inj Date/Time: 12/22/2016 1:50 PM Performed by: Tarry Kos Authorized by: Tarry Kos   Consent Given by:  Patient Timeout: prior to procedure the correct patient, procedure, and site was verified   Indications:  Pain Location:  Knee Site:  R knee Prep: patient was prepped and draped in usual sterile fashion   Needle Size:  22 G Ultrasound Guidance: No   Fluoroscopic Guidance: No   Arthrogram: No   Medications:  2 mL lidocaine 1 %; 2 mL bupivacaine 0.5 %; 40 mg methylPREDNISolone acetate 40 MG/ML Patient tolerance:  Patient tolerated the procedure well with no immediate complications     Clinical Data: No additional findings.   Subjective: Chief Complaint  Patient presents with  . Left Knee - Pain    Patient is a 71 year old gentleman with left knee pain since December status post injury. He is continues to have pain around the lateral aspect of the radiate to the back of his knee.  Denies mechanical sxs.  Pain is worse with activity.    Review of Systems  Constitutional: Negative.   All other systems reviewed and are negative.    Objective: Vital Signs: There were no vitals taken for this visit.  Physical Exam    Constitutional: He is oriented to person, place, and time. He appears well-developed and well-nourished.  HENT:  Head: Normocephalic and atraumatic.  Eyes: Pupils are equal, round, and reactive to light.  Neck: Neck supple.  Pulmonary/Chest: Effort normal.  Abdominal: Soft.  Musculoskeletal: Normal range of motion.  Neurological: He is alert and oriented to person, place, and time.  Skin: Skin is warm.  Psychiatric: He has a normal mood and affect. His behavior is normal. Judgment and thought content normal.  Nursing note and vitals reviewed.   Ortho Exam Left knee exam shows no joint effusion. She has normal range of motion. Collaterals and cruciates are stable. Patellar tracking is normal. Specialty Comments:  No specialty comments available.  Imaging: Xr Knee 3 View Left  Result Date: 12/22/2016 No acute or structural abnormalities.  Mild medial compartment joint space narrowing    PMFS History: There are no active problems to display for this patient.  No past medical history on file.  No family history on file.  No past surgical history on file. Social History   Occupational History  . Not on file.   Social History Main Topics  . Smoking status: Never Smoker  . Smokeless tobacco: Never Used  . Alcohol use No  . Drug use: Unknown  . Sexual activity: Not on  file

## 2017-01-26 ENCOUNTER — Ambulatory Visit (INDEPENDENT_AMBULATORY_CARE_PROVIDER_SITE_OTHER): Payer: Medicare PPO | Admitting: Orthopaedic Surgery

## 2017-01-27 ENCOUNTER — Ambulatory Visit (INDEPENDENT_AMBULATORY_CARE_PROVIDER_SITE_OTHER): Payer: Medicare PPO | Admitting: Orthopaedic Surgery

## 2017-03-16 ENCOUNTER — Encounter (HOSPITAL_BASED_OUTPATIENT_CLINIC_OR_DEPARTMENT_OTHER): Payer: Self-pay | Admitting: *Deleted

## 2017-03-16 ENCOUNTER — Emergency Department (HOSPITAL_BASED_OUTPATIENT_CLINIC_OR_DEPARTMENT_OTHER)
Admission: EM | Admit: 2017-03-16 | Discharge: 2017-03-16 | Disposition: A | Discharge status: Home / Self Care | Payer: Medicare PPO | Attending: Emergency Medicine | Admitting: Emergency Medicine

## 2017-03-16 DIAGNOSIS — Z5321 Procedure and treatment not carried out due to patient leaving prior to being seen by health care provider: Secondary | ICD-10-CM | POA: Insufficient documentation

## 2017-03-16 DIAGNOSIS — H5710 Ocular pain, unspecified eye: Secondary | ICD-10-CM | POA: Diagnosis not present

## 2017-03-16 NOTE — ED Triage Notes (Signed)
Pt c/o foreign body in eye ? Cement with redness and pain  x 3 days

## 2017-05-21 ENCOUNTER — Encounter (INDEPENDENT_AMBULATORY_CARE_PROVIDER_SITE_OTHER): Payer: Self-pay | Admitting: Family

## 2017-05-21 ENCOUNTER — Ambulatory Visit (INDEPENDENT_AMBULATORY_CARE_PROVIDER_SITE_OTHER): Payer: Medicare PPO | Admitting: Family

## 2017-05-21 ENCOUNTER — Ambulatory Visit (INDEPENDENT_AMBULATORY_CARE_PROVIDER_SITE_OTHER): Payer: Medicare PPO

## 2017-05-21 DIAGNOSIS — M25511 Pain in right shoulder: Secondary | ICD-10-CM

## 2017-05-21 DIAGNOSIS — M12811 Other specific arthropathies, not elsewhere classified, right shoulder: Secondary | ICD-10-CM | POA: Diagnosis not present

## 2017-05-21 MED ORDER — DICLOFENAC SODIUM 75 MG PO TBEC: 75.0000 mg | DELAYED_RELEASE_TABLET | Freq: Two times a day (BID) | ORAL | 3 refills | Status: DC | PRN

## 2017-05-21 NOTE — Progress Notes (Signed)
   Office Visit Note   Patient: Aaron Mendez           Date of Birth: Aug 11, 1946           MRN: [id number] Visit Date: 05/21/2017              Requested by: Billee CashingMcKenzie, [place], MD [address] [place], [place] [id number] PCP: Billee CashingMcKenzie, [place], MD  Chief Complaint  Patient presents with  . Right Shoulder - Pain      HPI: The patient is a 71 year old gentleman who presents a complaining of acute onset of right shoulder pain. 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 States now feels the aching pain has been worsening gradually over the last several days. He's been having a difficult time sleeping or lifting due to pain. Pain with above head reaching across chest reaching. Is able to reach his back. Has been using ice and Advil and aspirin without relief  Assessment & Plan: Visit Diagnoses:  1. Rotator cuff arthropathy, right   2. Right shoulder pain, unspecified chronicity     Plan: Depo-Medrol injection right shoulder today. He'll follow-up in office in 4 weeks if no improvement  Follow-Up Instructions: Return in about 4 weeks (around 06/18/2017), or if symptoms worsen or fail to improve.   Right Shoulder Exam   Tenderness  The patient is experiencing tenderness in the biceps tendon.  Range of Motion  Passive Abduction: normal   Tests  Drop Arm: positive Impingement: positive  Other  Sensation: normal Pulse: present      Patient is alert, oriented, no adenopathy, well-dressed, normal affect, normal respiratory effort.   Imaging: Xr Shoulder Right  Result Date: 05/21/2017 Radiographs of the right shoulder are negative for fracture. No acute finding.  No images are attached to the encounter.  Labs: Lab Results  Component Value Date   REPTSTATUS 07/28/2015 FINAL 07/26/2015   CULT  07/26/2015    >=100,000 COLONIES/mL  [facility] ESCHERICHIA COLI 07/26/2015    Orders:  Orders Placed This Encounter  Procedures  . XR Shoulder Right   No orders of the defined types were placed in this encounter.    Procedures: No procedures performed  Clinical Data: No additional findings.  ROS:  All other systems negative, except as noted in the HPI. Review of Systems  Constitutional: Negative for chills and fever.  Musculoskeletal: Positive for arthralgias and myalgias.  Neurological: Negative for weakness and numbness.    Objective: Vital Signs: There were no vitals taken for this visit.  Specialty Comments:  No specialty comments available.  PMFS History: There are no active problems to display for this patient.  No past medical history on file.  No family history on file.  No past surgical history on file. Social History   Occupational History  . Not on file.   Social History Main Topics  . Smoking status: Never Smoker  . Smokeless tobacco: Never Used  . Alcohol use No  . Drug use: No  . Sexual activity: No

## 2017-05-23 ENCOUNTER — Emergency Department (HOSPITAL_COMMUNITY)
Admission: EM | Admit: 2017-05-23 | Discharge: 2017-05-23 | Disposition: A | Discharge status: Home / Self Care | Payer: Medicare PPO | Attending: Emergency Medicine | Admitting: Emergency Medicine

## 2017-05-23 ENCOUNTER — Encounter (HOSPITAL_COMMUNITY): Payer: Self-pay | Admitting: *Deleted

## 2017-05-23 DIAGNOSIS — B029 Zoster without complications: Secondary | ICD-10-CM | POA: Insufficient documentation

## 2017-05-23 DIAGNOSIS — R509 Fever, unspecified: Secondary | ICD-10-CM

## 2017-05-23 DIAGNOSIS — R21 Rash and other nonspecific skin eruption: Secondary | ICD-10-CM | POA: Diagnosis present

## 2017-05-23 MED ORDER — VALACYCLOVIR HCL 1 G PO TABS: 1000.0000 mg | ORAL_TABLET | Freq: Three times a day (TID) | ORAL | 0 refills | Status: DC

## 2017-05-23 MED ORDER — ACETAMINOPHEN 325 MG PO TABS: 650.0000 mg | ORAL_TABLET | Freq: Once | ORAL | Status: DC

## 2017-05-23 MED ORDER — HYDROCODONE-ACETAMINOPHEN 5-325 MG PO TABS: 1.0000 | ORAL_TABLET | ORAL | 0 refills | Status: DC | PRN

## 2017-05-23 NOTE — ED Provider Notes (Signed)
[facility]-EMERGENCY DEPT Provider Note   CSN: [id number] Arrival date & time: 05/23/17  1226     History   Chief Complaint Chief Complaint  Patient presents with  . generalized pain    HPI Rontae Inglett is a 71 y.o. male.  Pt presents to the ED today with a rash and not feeling well.  The pt thinks he has poison ivy.  The pt said sx have been going on for about 1 week.  The pt's daughter has been putting calamine lotion on it.  The pt has not been feeling well for the last few days.        History reviewed. No pertinent past medical history.  There are no active problems to display for this patient.   History reviewed. No pertinent surgical history.     Home Medications    Prior to Admission medications   Medication Sig Start Date End Date Taking? Authorizing Provider  diclofenac (VOLTAREN) 75 MG EC tablet Take 1 tablet (75 mg total) by mouth 2 (two) times daily as needed for mild pain or moderate pain. With meals 05/21/17 06/20/17  Adonis Huguenin, NP  Guaifenesin 1200 MG TB12 Take 1 tablet (1,200 mg total) by mouth 2 (two) times daily. 07/26/15   Lawyer, Cristal Deer, PA-C  HYDROcodone-acetaminophen (NORCO/VICODIN) 5-325 MG tablet Take 1 tablet by mouth every 4 (four) hours as needed. 05/23/17   Jacalyn Lefevre, MD  ibuprofen (ADVIL,MOTRIN) 200 MG tablet Take 400 mg by mouth every 6 (six) hours as needed for moderate pain.    [provider]  promethazine-dextromethorphan (PROMETHAZINE-DM) 6.25-15 MG/5ML syrup Take 5 mLs by mouth 4 (four) times daily as needed for cough. 07/26/15   Lawyer, Cristal Deer, PA-C  traMADol (ULTRAM) 50 MG tablet Take 1 tablet (50 mg total) by mouth every 6 (six) hours as needed. 08/16/16   Fayrene Helper, PA-C  valACYclovir (VALTREX) 1000 MG tablet Take 1 tablet (1,000 mg total) by mouth 3 (three) times daily. 05/23/17   Jacalyn Lefevre, MD    Family History No family history on file.  Social History Social History  Substance Use Topics  .  Smoking status: Never Smoker  . Smokeless tobacco: Never Used  . Alcohol use No     Allergies   Patient has no known allergies.   Review of Systems Review of Systems  Constitutional: Positive for fatigue and fever.  Skin: Positive for rash.  All other systems reviewed and are negative.    Physical Exam Updated Vital Signs BP (!) 144/83 (BP Location: Left Arm)   Pulse 75   Temp (!) 102.5 F (39.2 C) (Oral)   Resp 18   Ht  (1.803 m)   Wt 81.6 kg (180 lb)   SpO2 94%   BMI 25.10 kg/m   Physical Exam  Constitutional: He appears well-developed and well-nourished.  HENT:  Head: Normocephalic and atraumatic.  Right Ear: External ear normal.  Left Ear: External ear normal.  Nose: Nose normal.  Mouth/Throat: Oropharynx is clear and moist.  Eyes: Pupils are equal, round, and reactive to light. Conjunctivae and EOM are normal.  Neck: Normal range of motion. Neck supple.  Cardiovascular: Normal rate, regular rhythm, normal heart sounds and intact distal pulses.   Pulmonary/Chest: Effort normal and breath sounds normal.  Abdominal: Soft. Bowel sounds are normal.  Neurological: He is alert.  Skin:     Psychiatric: He has a normal mood and affect.  Nursing note and vitals reviewed.    ED Treatments /  Results  Labs (all labs ordered are listed, but only abnormal results are displayed) Labs Reviewed - No data to display  EKG  EKG Interpretation None       Radiology No results found.  Procedures Procedures (including critical care time)  Medications Ordered in ED Medications - No data to display   Initial Impression / Assessment and Plan / ED Course  I have reviewed the triage vital signs and the nursing notes.  Pertinent labs & imaging results that were available during my care of the patient were reviewed by me and considered in my medical decision making (see chart for details).   Pt knows to return if worse and to f/u with pcp.  Final Clinical  Impressions(s) / ED Diagnoses   Final diagnoses:  Herpes zoster with complication    New Prescriptions New Prescriptions   HYDROCODONE-ACETAMINOPHEN (NORCO/VICODIN) 5-325 MG TABLET    Take 1 tablet by mouth every 4 (four) hours as needed.   VALACYCLOVIR (VALTREX) 1000 MG TABLET    Take 1 tablet (1,000 mg total) by mouth 3 (three) times daily.     Jacalyn Lefevre, MD 05/23/17 [phone]

## 2017-05-23 NOTE — ED Triage Notes (Signed)
Pt with pain and multiple clear filled blisters on rt shoulder, arm, around to spine. Some are weeping, others remain intact. Several days of increasing pain.

## 2017-05-23 NOTE — ED Notes (Signed)
Bed: [facility] Expected date:  Expected time:  Means of arrival:  Comments: 

## 2017-12-15 ENCOUNTER — Ambulatory Visit (INDEPENDENT_AMBULATORY_CARE_PROVIDER_SITE_OTHER): Payer: Medicare PPO | Admitting: Orthopaedic Surgery

## 2017-12-15 DIAGNOSIS — M1712 Unilateral primary osteoarthritis, left knee: Secondary | ICD-10-CM | POA: Diagnosis not present

## 2017-12-15 MED ORDER — LIDOCAINE HCL 1 % IJ SOLN
2.0000 mL | INTRAMUSCULAR | Status: AC | PRN
  Administered 2017-12-15: 2 mL

## 2017-12-15 MED ORDER — TRAMADOL HCL 50 MG PO TABS: 50.0000 mg | ORAL_TABLET | Freq: Three times a day (TID) | ORAL | 2 refills | Status: DC | PRN

## 2017-12-15 MED ORDER — BUPIVACAINE HCL 0.5 % IJ SOLN
2.0000 mL | INTRAMUSCULAR | Status: AC | PRN
  Administered 2017-12-15: 2 mL via INTRA_ARTICULAR

## 2017-12-15 MED ORDER — DICLOFENAC SODIUM 1 % TD GEL: 2.0000 g | Freq: Four times a day (QID) | TRANSDERMAL | 5 refills | Status: DC

## 2017-12-15 MED ORDER — METHYLPREDNISOLONE ACETATE 40 MG/ML IJ SUSP
40.0000 mg | INTRAMUSCULAR | Status: AC | PRN
  Administered 2017-12-15: 40 mg via INTRA_ARTICULAR

## 2017-12-15 NOTE — Progress Notes (Signed)
   Office Visit Note   Patient: Aaron Mendez           Date of Birth: 02/26/1946           MRN: [id number] Visit Date: 12/15/2017              Requested by: Billee CashingMcKenzie, [place], MD [address] [place], [place] [id number] PCP: Billee CashingMcKenzie, [place], MD   Assessment & Plan: Visit Diagnoses:  1. Unilateral primary osteoarthritis, left knee     Plan: Impression is 72 year old gentleman with left knee degenerative joint disease.  Patient did undergo cortisone injection without complication today.  Prescription for Voltaren gel and tramadol to be used as needed.  He understands that I will not give him any opiate pain medicines for his chronic arthritis.  Follow-up as needed.  Follow-Up Instructions: Return if symptoms worsen or fail to improve.   Orders:  No orders of the defined types were placed in this encounter.  Meds ordered this encounter  Medications  . diclofenac sodium (VOLTAREN) 1 % GEL    Sig: Apply 2 g topically 4 (four) times daily.    Dispense:  1 Tube    Refill:  5  . traMADol (ULTRAM) 50 MG tablet    Sig: Take 1-2 tablets (50-100 mg total) by mouth 3 (three) times daily as needed.    Dispense:  30 tablet    Refill:  2      Procedures: Large Joint Inj: L knee on 12/15/2017 4:58 PM Details: 22 G needle Medications: 2 mL bupivacaine 0.5 %; 2 mL lidocaine 1 %; 40 mg methylPREDNISolone acetate 40 MG/ML Outcome: tolerated well, no immediate complications Patient was prepped and draped in the usual sterile fashion.       Clinical Data: No additional findings.   Subjective: Chief Complaint  Patient presents with  . Left Knee - Pain    Patient is a 72 year old gentleman who follows up for his left knee pain.  I previously saw him a year ago for degenerative joint disease.  Patient is not interested in injections at that time.  He was getting chronic narcotic pain medicines from his PCP Dr. Ronne BinningMcKenzie.   Review of Systems   Objective: Vital Signs: There were  no vitals taken for this visit.  Physical Exam  Ortho Exam Left knee exam shows no joint effusion. Specialty Comments:  No specialty comments available.  Imaging: No results found.   PMFS History: There are no active problems to display for this patient.  No past medical history on file.  No family history on file.  No past surgical history on file. Social History   Occupational History  . Not on file  Tobacco Use  . Smoking status: Never Smoker  . Smokeless tobacco: Never Used  Substance and Sexual Activity  . Alcohol use: No  . Drug use: No  . Sexual activity: Never

## 2017-12-21 ENCOUNTER — Encounter: Payer: Self-pay | Admitting: Family Medicine

## 2017-12-21 ENCOUNTER — Ambulatory Visit (INDEPENDENT_AMBULATORY_CARE_PROVIDER_SITE_OTHER): Payer: Medicare PPO | Admitting: Family Medicine

## 2017-12-21 VITALS — BP 120/86 | HR 70 | Ht 70.0 in | Wt 193.2 lb

## 2017-12-21 DIAGNOSIS — Z1159 Encounter for screening for other viral diseases: Secondary | ICD-10-CM

## 2017-12-21 DIAGNOSIS — Z23 Encounter for immunization: Secondary | ICD-10-CM | POA: Diagnosis not present

## 2017-12-21 DIAGNOSIS — M1712 Unilateral primary osteoarthritis, left knee: Secondary | ICD-10-CM | POA: Diagnosis not present

## 2017-12-21 DIAGNOSIS — Z0001 Encounter for general adult medical examination with abnormal findings: Secondary | ICD-10-CM

## 2017-12-21 DIAGNOSIS — Z1211 Encounter for screening for malignant neoplasm of colon: Secondary | ICD-10-CM

## 2017-12-21 DIAGNOSIS — Z13220 Encounter for screening for lipoid disorders: Secondary | ICD-10-CM | POA: Diagnosis not present

## 2017-12-21 DIAGNOSIS — N400 Enlarged prostate without lower urinary tract symptoms: Secondary | ICD-10-CM

## 2017-12-21 NOTE — Patient Instructions (Signed)
Talk with your pharmacist regarding shingles (shingrix) vaccine You will be contacted with an appt for a screening colonoscopy.   You should visit your health care provider regularly, even if you feel healthy. The purpose of these visits is to:  Screen for medical issues Assess your risk for future medical problems Encourage a healthy lifestyle Update vaccinations Help you get to know your provider in case of an illness Information Even if you feel fine, you should still see your provider for regular checkups. These visits can help you avoid problems in the future. For example, the only way to find out if you have high blood pressure is to have it checked regularly. High blood sugar and high cholesterol levels also may not have any symptoms in the early stages. A simple blood test can check for these conditions.  There are specific times when you should see your provider. Below are screening guidelines for men age 73 and older.  ABDOMINAL AORTIC ANEURYSM SCREENING  If you are between ages 965 and 4875 and have smoked, you should have an ultrasound to screen for abdominal aortic aneurysms. Other men should discuss this screening with their provider. BLOOD PRESSURE SCREENING  Have your blood pressure checked at least once every year. If the top number (systolic number) is between 120 and 139 or the bottom number (diastolic number) is between 80 and 89 mm Hg or higher, then continue to have it checked every year. If the top number is greater than 140, or the bottom number is greater than 90, schedule an appointment with your provider. If you have diabetes, heart disease, kidney problems, or certain other conditions, you may need to be checked more often, at least once a year. Watch for blood pressure screenings in your area. Ask your provider if you can stop in to have your blood pressure checked. You can also check your blood pressure using the automated machines at local grocery stores and  pharmacies. CHOLESTEROL SCREENING AND HEART DISEASE PREVENTION  Your cholesterol should be checked at least every 5 years if levels are normal. If you have high cholesterol, diabetes, heart disease, kidney problems, or certain other conditions, you may need to be checked more often. LUNG CANCER SCREENING  The [place] Preventive Services Task Force recommends annual screening for lung cancer with low-dose computed tomography (LDCT) in adults ages 4955 to 9180 years who:  Have a 30 pack-year smoking history AND Currently smoke or have quit within the past 15 years COLORECTAL CANCER SCREENING  Until age 73, you should have screening for colorectal cancer on a regular basis. If you are age 73 or older, you should ask your provider if you need to be screened. Several tests are available for colorectal cancer screening:   A fecal occult blood (stool-based) test done every year A fecal immunochemical test (FIT) every year A stool DNA test every 3 years. Flexible sigmoidoscopy every 5 years Double contrast barium enema every 5 years CT colonography (virtual colonoscopy) every 5 years Colonoscopy every 10 years You may need a colonoscopy more often if you have risk factors for colorectal cancer, such as:  Ulcerative colitis A personal or family history of cancer of the colon or rectum A history of growths called adenomatous polyps DIABETES SCREENING  If you are age 73 or older and in good health, you should be screened for diabetes every 3 years. If you are overweight and have other risk factors for diabetes, ask your provider if you should be screened more often.  DENTAL EXAM  Go to the dentist once or twice every year for an exam and cleaning. Your dentist will evaluate if you have a need for more frequent visits. EYE EXAM  Have an eye exam every 1 to 2 years. Have an eye exam at least every year if you have diabetes. HEARING TEST  Have your hearing tested if you have symptoms of hearing  loss. IMMUNIZATIONS  If you are age 65 or older, get a pneumococcal vaccine. You should get a flu shot each year. Get a tetanus-diphtheria booster every 10 years. You may get a shingles, or herpes zoster, vaccine at age 52 or older. OSTEOPOROSIS SCREENING  If you have risk factors for osteoporosis, you should check with your provider about screening. Risk factors can include long-term steroid use, low body weight, smoking, heavy alcohol use, a fracture after age 75, or a family history of osteoporosis. Men age 65 and over should consider getting bone mineral density testing. PROSTATE CANCER SCREENING  Talk with your provider about prostate cancer screening, especially for men over age 54. The potential benefits of PSA testing as a routine screening test have not been definitively shown to outweigh the harms of testing and treatment. Prostate examinations are no longer routinely done on men with no symptoms. PHYSICAL EXAMS  Have a yearly physical exam. Your provider will check your weight, height, and body mass index (BMI).

## 2017-12-21 NOTE — Progress Notes (Signed)
Aaron Mendez - 72 y.o. male MRN [id number]  Date of birth: 1946/08/24  Subjective Chief Complaint  Patient presents with  . Annual Exam    HPI Aaron Mendez is a 72 y.o. male here today as a new patient for annual exam.  He tells me he has not had a physical in several years.  He was seeing Dr. Ronne BinningMcKenzie for management of opioid pain medication to treat his OA however has most recently started seeing an orthopedist for management of this.  He had an injection to the left knee last week and feels he is noticed some improvement.  His orthopedist also prescribed him tramadol and topical diclofenac for pain management.  He has not yet picked up his diclofenac.  He is interested in having health maintenance items updated as well as updated lab work completed today.  He does report some decreased libido and increased urinary frequency as well as nocturia.  He tells me he wakes up 2-3 times each night and occasionally has some trouble starting his stream.  He has no family history of prostate cancer.  Review of Systems  Constitutional: Negative for chills and fever.  HENT: Negative for congestion, ear pain, hearing loss and tinnitus.   Eyes: Positive for blurred vision (Occasional with watching TV, denies any issues with reading of driving.). Negative for double vision and pain.  Respiratory: Negative for cough, sputum production, shortness of breath and wheezing.   Cardiovascular: Negative for chest pain, palpitations, claudication and leg swelling.  Gastrointestinal: Negative for abdominal pain, blood in stool, constipation, diarrhea, heartburn, nausea and vomiting.  Genitourinary: Positive for frequency (+nocturia as well). Negative for dysuria and hematuria.  Musculoskeletal: Positive for joint pain. Negative for myalgias.  Skin: Negative for rash.  Neurological: Negative for dizziness, tremors, weakness and headaches.  Endo/Heme/Allergies: Does not bruise/bleed easily.  Psychiatric/Behavioral:  Negative for depression, substance abuse and suicidal ideas.    No Known Allergies  Past Medical History:  Diagnosis Date  . Bilateral primary osteoarthritis of knee   . Sickle cell trait (HCC)     History reviewed. No pertinent surgical history.  Social History   Socioeconomic History  . Marital status: Single    Spouse name: Not on file  . Number of children: Not on file  . Years of education: Not on file  . Highest education level: Not on file  Occupational History  . Not on file  Social Needs  . Financial resource strain: Not on file  . Food insecurity:    Worry: Not on file    Inability: Not on file  . Transportation needs:    Medical: Not on file    Non-medical: Not on file  Tobacco Use  . Smoking status: Never Smoker  . Smokeless tobacco: Never Used  Substance and Sexual Activity  . Alcohol use: No  . Drug use: No  . Sexual activity: Never  Lifestyle  . Physical activity:    Days per week: Not on file    Minutes per session: Not on file  . Stress: Not on file  Relationships  . Social connections:    Talks on phone: Not on file    Gets together: Not on file    Attends religious service: Not on file    Active member of club or organization: Not on file    Attends meetings of clubs or organizations: Not on file    Relationship status: Not on file  Other Topics Concern  . Not on file  Social History Narrative  . Not on file    Family History  Problem Relation Age of Onset  . Sickle cell anemia Son     Health Maintenance  Topic Date Due  . Hepatitis C Screening  08-31-1946  . TETANUS/TDAP  11/06/1963  . COLONOSCOPY  11/05/1994  . PNA vac Low Risk Adult (1 of 2 - PCV13) 11/05/2009  . INFLUENZA VACCINE  04/08/2018    ----------------------------------------------------------------------------------------------------------------------------------------------------------------------------------------------------------------- Physical Exam BP  120/86   Pulse 70   Ht 5\' 10"  (1.778 m)   Wt 193 lb 3.2 oz (87.6 kg)   BMI 27.72 kg/m   Physical Exam  Constitutional: He is oriented to person, place, and time. He appears well-developed and well-nourished. No distress.  HENT:  Head: Normocephalic and atraumatic.  Mouth/Throat: Oropharynx is clear and moist. No oropharyngeal exudate.  Eyes: Pupils are equal, round, and reactive to light. Conjunctivae and EOM are normal. No scleral icterus.  Neck: Neck supple. Carotid bruit is not present. No thyromegaly present.  Cardiovascular: Normal rate, regular rhythm and normal heart sounds.  Pulmonary/Chest: Effort normal and breath sounds normal.  Abdominal: Soft. He exhibits no distension. There is no tenderness.  Genitourinary: Rectum normal. Prostate is enlarged (mildly enlarged, symmetric without nodules).  Lymphadenopathy:    He has no cervical adenopathy.  Neurological: He is alert and oriented to person, place, and time. No cranial nerve deficit. Coordination and gait normal.  Skin: No rash noted.  Psychiatric: He has a normal mood and affect. His behavior is normal.    ------------------------------------------------------------------------------------------------------------------------------------------------------------------------------------------------------------------- Assessment and Plan  Encounter for well adult exam with abnormal findings Reviewed healthy lifestyle and diet including recommendations for daily exercise. Reviewed precautions on preventing falls given advanced age with arthritis Discussed with him recommendations for screening and he is in agreement's to have Prevnar vaccine today.   He plans obtain his shingles vaccine through his pharmacy.  I placed a referral for colonoscopy as he is overdue for colon cancer screening. Screening for hepatitis C ordered He has already scheduled an appointment with an eye doctor.  Enlarged prostate on rectal  examination We will check PSA given enlarged prostate as well as symptoms of BPH.  Unilateral primary osteoarthritis, left knee He will continue to follow with his orthopedist for management of this for now.  Stable at this time.

## 2017-12-21 NOTE — Assessment & Plan Note (Signed)
He will continue to follow with his orthopedist for management of this for now.  Stable at this time.

## 2017-12-21 NOTE — Assessment & Plan Note (Signed)
We will check PSA given enlarged prostate as well as symptoms of BPH.

## 2017-12-21 NOTE — Assessment & Plan Note (Addendum)
Reviewed healthy lifestyle and diet including recommendations for daily exercise. Reviewed precautions on preventing falls given advanced age with arthritis Discussed with him recommendations for screening and he is in agreement's to have Prevnar vaccine today.   He plans obtain his shingles vaccine through his pharmacy.  I placed a referral for colonoscopy as he is overdue for colon cancer screening. Screening for hepatitis C ordered He has already scheduled an appointment with an eye doctor.

## 2017-12-22 LAB — LIPID PANEL
CHOL/HDL RATIO: 4
Cholesterol: 221 mg/dL — ABNORMAL HIGH (ref 0–200)
HDL: 49.5 mg/dL (ref >39.00)
LDL CALC: 148 mg/dL — AB (ref 0–99)
NONHDL: 171.83
TRIGLYCERIDES: 117 mg/dL (ref 0.0–149.0)
VLDL: 23.4 mg/dL (ref 0.0–40.0)

## 2017-12-22 LAB — COMPREHENSIVE METABOLIC PANEL
ALBUMIN: 4.7 g/dL (ref 3.5–5.2)
ALT: 17 U/L (ref 0–53)
AST: 15 U/L (ref 0–37)
Alkaline Phosphatase: 40 U/L (ref 39–117)
BUN: 11 mg/dL (ref 6–23)
CHLORIDE: 105 meq/L (ref 96–112)
CO2: 26 meq/L (ref 19–32)
Calcium: 9.5 mg/dL (ref 8.4–10.5)
Creatinine, Ser: 1.01 mg/dL (ref 0.40–1.50)
GFR: 93.09 mL/min (ref >60.00)
Glucose, Bld: 88 mg/dL (ref 70–99)
POTASSIUM: 4.7 meq/L (ref 3.5–5.1)
SODIUM: 139 meq/L (ref 135–145)
Total Bilirubin: 1 mg/dL (ref 0.2–1.2)
Total Protein: 7.9 g/dL (ref 6.0–8.3)

## 2017-12-22 LAB — CBC
HCT: 45.5 % (ref 39.0–52.0)
HEMOGLOBIN: 15 g/dL (ref 13.0–17.0)
MCHC: 32.9 g/dL (ref 30.0–36.0)
MCV: 88 fl (ref 78.0–100.0)
PLATELETS: 306 10*3/uL (ref 150.0–400.0)
RBC: 5.17 Mil/uL (ref 4.22–5.81)
RDW: 13.1 % (ref 11.5–15.5)
WBC: 7.1 10*3/uL (ref 4.0–10.5)

## 2017-12-22 LAB — HEPATITIS C ANTIBODY
Hepatitis C Ab: NONREACTIVE
SIGNAL TO CUT-OFF: 0.02 (ref <1.00)

## 2017-12-22 LAB — PSA: PSA: 0.88 ng/mL (ref 0.10–4.00)

## 2017-12-22 LAB — TSH: TSH: 2.99 u[IU]/mL (ref 0.35–4.50)

## 2017-12-28 ENCOUNTER — Other Ambulatory Visit: Payer: Self-pay | Admitting: Emergency Medicine

## 2017-12-28 MED ORDER — ATORVASTATIN CALCIUM 20 MG PO TABS: 20.0000 mg | ORAL_TABLET | Freq: Every day | ORAL | 0 refills | Status: DC

## 2018-01-20 ENCOUNTER — Encounter: Payer: Self-pay | Admitting: Family Medicine

## 2018-03-25 ENCOUNTER — Other Ambulatory Visit: Payer: Self-pay | Admitting: Family Medicine

## 2018-05-14 ENCOUNTER — Ambulatory Visit (INDEPENDENT_AMBULATORY_CARE_PROVIDER_SITE_OTHER): Payer: Medicare PPO

## 2018-05-14 ENCOUNTER — Encounter (INDEPENDENT_AMBULATORY_CARE_PROVIDER_SITE_OTHER): Payer: Self-pay | Admitting: Orthopaedic Surgery

## 2018-05-14 ENCOUNTER — Ambulatory Visit (INDEPENDENT_AMBULATORY_CARE_PROVIDER_SITE_OTHER): Payer: Medicare PPO | Admitting: Orthopaedic Surgery

## 2018-05-14 ENCOUNTER — Other Ambulatory Visit (INDEPENDENT_AMBULATORY_CARE_PROVIDER_SITE_OTHER): Payer: Self-pay | Admitting: Orthopaedic Surgery

## 2018-05-14 DIAGNOSIS — M545 Low back pain, unspecified: Secondary | ICD-10-CM

## 2018-05-14 DIAGNOSIS — G8929 Other chronic pain: Secondary | ICD-10-CM

## 2018-05-14 DIAGNOSIS — M25532 Pain in left wrist: Secondary | ICD-10-CM

## 2018-05-14 MED ORDER — METHOCARBAMOL 500 MG PO TABS: 500.0000 mg | ORAL_TABLET | Freq: Four times a day (QID) | ORAL | 2 refills | Status: DC | PRN

## 2018-05-14 MED ORDER — MELOXICAM 7.5 MG PO TABS: 15.0000 mg | ORAL_TABLET | Freq: Every day | ORAL | 2 refills | Status: DC | PRN

## 2018-05-14 MED ORDER — PREDNISONE 10 MG (21) PO TBPK: ORAL_TABLET | ORAL | 0 refills | Status: DC

## 2018-05-14 NOTE — Progress Notes (Signed)
Office Visit Note   Patient: Aaron Mendez           Date of Birth: Jun 29, 1946           MRN: [id number] Visit Date: 05/14/2018              Requested by: Everrett Coombe, DO [address] [place], [place] [address] PCP: Everrett Coombe, DO   Assessment & Plan: Visit Diagnoses:  1. Pain in left wrist   2. Chronic midline low back pain without sciatica     Plan: I think [name] may have overdone it with his recent activity.  I recommend relative rest with ice and heat as well as prescription for Robaxin, steroid taper, meloxicam.  Questions encouraged and answered.  Follow-up as needed.  Follow-Up Instructions: Return if symptoms worsen or fail to improve.   Orders:  Orders Placed This Encounter  Procedures  . XR Wrist Complete Left  . XR Lumbar Spine 2-3 Views   Meds ordered this encounter  Medications  . methocarbamol (ROBAXIN) 500 MG tablet    Sig: Take 1 tablet (500 mg total) by mouth every 6 (six) hours as needed for muscle spasms.    Dispense:  30 tablet    Refill:  2  . predniSONE (STERAPRED UNI-PAK 21 TAB) 10 MG (21) TBPK tablet    Sig: Take as directed    Dispense:  21 tablet    Refill:  0  . meloxicam (MOBIC) 7.5 MG tablet    Sig: Take 2 tablets (15 mg total) by mouth daily as needed for pain. Take once you've completed steroid pack    Dispense:  30 tablet    Refill:  2      Procedures: No procedures performed   Clinical Data: No additional findings.   Subjective: Chief Complaint  Patient presents with  . Left Wrist - Pain    Aaron Mendez is a 72 year old gentleman who comes in with acute left wrist and low back pain for 1 week.  He has been lifting heavy blocks.  He states that he has pain on the dorsum of his left wrist and midline in his low back without radiation of pain.  Denies any focal motor or sensory deficits.  He does endorse some stiffness related to the swelling.   Review of Systems  Constitutional: Negative.   All other systems  reviewed and are negative.    Objective: Vital Signs: There were no vitals taken for this visit.  Physical Exam  Constitutional: He is oriented to person, place, and time. He appears well-developed and well-nourished.  Pulmonary/Chest: Effort normal.  Abdominal: Soft.  Neurological: He is alert and oriented to person, place, and time.  Skin: Skin is warm.  Psychiatric: He has a normal mood and affect. His behavior is normal. Judgment and thought content normal.  Nursing note and vitals reviewed.   Ortho Exam Exam shows some mild swelling on the dorsum of the wrist.  Radial styloid is nontender.  Negative Finkelstein's. Low back exam shows midline tenderness.  Rest of the exam is unremarkable. Specialty Comments:  No specialty comments available.  Imaging: Xr Wrist Complete Left  Result Date: 05/14/2018 No acute or structural abnormalities.  Xr Lumbar Spine 2-3 Views  Result Date: 05/14/2018 Degenerative scoliosis with lumbar spondylosis.    PMFS History: Patient Active Problem List   Diagnosis Date Noted  . Encounter for well adult exam with abnormal findings 12/21/2017  . Enlarged prostate on rectal examination 12/21/2017  . Unilateral  primary osteoarthritis, left knee 12/21/2017   Past Medical History:  Diagnosis Date  . Bilateral primary osteoarthritis of knee   . Sickle cell trait (HCC)     Family History  Problem Relation Age of Onset  . Sickle cell anemia Son     History reviewed. No pertinent surgical history. Social History   Occupational History  . Not on file  Tobacco Use  . Smoking status: Never Smoker  . Smokeless tobacco: Never Used  Substance and Sexual Activity  . Alcohol use: No  . Drug use: No  . Sexual activity: Yes

## 2018-06-29 ENCOUNTER — Encounter: Payer: Self-pay | Admitting: Family Medicine

## 2018-06-29 ENCOUNTER — Ambulatory Visit: Payer: Medicare PPO | Admitting: Family Medicine

## 2018-06-29 DIAGNOSIS — B9789 Other viral agents as the cause of diseases classified elsewhere: Secondary | ICD-10-CM | POA: Diagnosis not present

## 2018-06-29 DIAGNOSIS — J069 Acute upper respiratory infection, unspecified: Secondary | ICD-10-CM | POA: Diagnosis not present

## 2018-06-29 DIAGNOSIS — N529 Male erectile dysfunction, unspecified: Secondary | ICD-10-CM | POA: Insufficient documentation

## 2018-06-29 MED ORDER — PREDNISONE 20 MG PO TABS: 40.0000 mg | ORAL_TABLET | Freq: Every day | ORAL | 0 refills | Status: AC

## 2018-06-29 MED ORDER — TADALAFIL 20 MG PO TABS: 10.0000 mg | ORAL_TABLET | ORAL | 3 refills | Status: DC | PRN

## 2018-06-29 NOTE — Patient Instructions (Signed)
Upper Respiratory Infection, Adult Most upper respiratory infections (URIs) are caused by a virus. A URI affects the nose, throat, and upper air passages. The most common type of URI is often called "the common cold." Follow these instructions at home:  Take medicines only as told by your doctor.  Gargle warm saltwater or take cough drops to comfort your throat as told by your doctor.  Use a warm mist humidifier or inhale steam from a shower to increase air moisture. This may make it easier to breathe.  Drink enough fluid to keep your pee (urine) clear or pale yellow.  Eat soups and other clear broths.  Have a healthy diet.  Rest as needed.  Go back to work when your fever is gone or your doctor says it is okay. ? You may need to stay home longer to avoid giving your URI to others. ? You can also wear a face mask and wash your hands often to prevent spread of the virus.  Use your inhaler more if you have asthma.  Do not use any tobacco products, including cigarettes, chewing tobacco, or electronic cigarettes. If you need help quitting, ask your doctor. Contact a doctor if:  You are getting worse, not better.  Your symptoms are not helped by medicine.  You have chills.  You are getting more short of breath.  You have brown or red mucus.  You have yellow or brown discharge from your nose.  You have pain in your face, especially when you bend forward.  You have a fever.  You have puffy (swollen) neck glands.  You have pain while swallowing.  You have white areas in the back of your throat. Get help right away if:  You have very bad or constant: ? Headache. ? Ear pain. ? Pain in your forehead, behind your eyes, and over your cheekbones (sinus pain). ? Chest pain.  You have long-lasting (chronic) lung disease and any of the following: ? Wheezing. ? Long-lasting cough. ? Coughing up blood. ? A change in your usual mucus.  You have a stiff neck.  You have  changes in your: ? Vision. ? Hearing. ? Thinking. ? Mood. This information is not intended to replace advice given to you by your health care provider. Make sure you discuss any questions you have with your health care provider. Document Released: 02/11/2008 Document Revised: 04/27/2016 Document Reviewed: 11/30/2013 Elsevier Interactive Patient Education  2018 [employer]  

## 2018-06-29 NOTE — Assessment & Plan Note (Signed)
-  Has done well with tadalafil in the past, now generic.  -Rx for tadalafil.  Discussed potential side effects and avoidance of nitrates with this medication

## 2018-06-29 NOTE — Assessment & Plan Note (Signed)
-  May continue supportive care with OTC cold medications. -Remain well hydrated -Rx for prednisone for hoarsness/congestion.  -Call if not improving or return for worsening symptoms.

## 2018-06-29 NOTE — Progress Notes (Signed)
Aaron Mendez - 72 y.o. male MRN [id number]  Date of birth: November 21, 1945  Subjective Chief Complaint  Patient presents with  . Cough  . Nasal Congestion    HPI Aaron Mendez is a 72 y.o. male here today with complaint of nasal congestion, cough and hoarseness x9  Days.  Cough is productive with clear sputum.  Has tried OTC cold and flu medication with some improvement.  He denies fever, chills, shortness of breath, wheezing, chest pain, nausea, vomiting, diarrhea, headache or sinus pain.    He also complains of ED, symptoms for several years.  Has tried sildenafil and tadalafil previously.  Tadalafil worked better but was too expensive to continue.  He denies side effects from these medications in the past.  He denies any issues with chest pain or exertional dyspnea.  He is not on any nitrate containing medications.   ROS:  A comprehensive ROS was completed and negative except as noted per HPI  No Known Allergies  Past Medical History:  Diagnosis Date  . Bilateral primary osteoarthritis of knee   . Sickle cell trait (HCC)     No past surgical history on file.  Social History   Socioeconomic History  . Marital status: Single    Spouse name: Not on file  . Number of children: Not on file  . Years of education: Not on file  . Highest education level: Not on file  Occupational History  . Not on file  Social Needs  . Financial resource strain: Not on file  . Food insecurity:    Worry: Not on file    Inability: Not on file  . Transportation needs:    Medical: Not on file    Non-medical: Not on file  Tobacco Use  . Smoking status: Never Smoker  . Smokeless tobacco: Never Used  Substance and Sexual Activity  . Alcohol use: No  . Drug use: No  . Sexual activity: Yes  Lifestyle  . Physical activity:    Days per week: Not on file    Minutes per session: Not on file  . Stress: Not on file  Relationships  . Social connections:    Talks on phone: Not on file    Gets together:  Not on file    Attends religious service: Not on file    Active member of club or organization: Not on file    Attends meetings of clubs or organizations: Not on file    Relationship status: Not on file  Other Topics Concern  . Not on file  Social History Narrative  . Not on file    Family History  Problem Relation Age of Onset  . Sickle cell anemia Son     Health Maintenance  Topic Date Due  . Janet Berlin  11/06/1963  . COLONOSCOPY  11/05/1994  . INFLUENZA VACCINE  04/08/2018  . PNA vac Low Risk Adult (2 of 2 - PPSV23) 12/22/2018  . Hepatitis C Screening  Completed    ----------------------------------------------------------------------------------------------------------------------------------------------------------------------------------------------------------------- Physical Exam BP 126/82   Pulse 62   Temp 97.9 F (36.6 C)   Ht 5\' 10"  (1.778 m)   Wt 203 lb 12.8 oz (92.4 kg)   SpO2 96%   BMI 29.24 kg/m   Physical Exam  Constitutional: He is oriented to person, place, and time. He appears well-nourished. No distress.  hoarseness noted with speech  HENT:  Head: Normocephalic and atraumatic.  Mouth/Throat: Oropharynx is clear and moist.  Eyes: No scleral icterus.  Neck: Normal range  of motion. Neck supple. No thyromegaly present.  Cardiovascular: Normal rate, regular rhythm and normal heart sounds.  Pulmonary/Chest: Effort normal and breath sounds normal.  Musculoskeletal: He exhibits no edema.  Lymphadenopathy:    He has no cervical adenopathy.  Neurological: He is oriented to person, place, and time.  Skin: Skin is warm and dry. No rash noted.  Psychiatric: He has a normal mood and affect. His behavior is normal.    ------------------------------------------------------------------------------------------------------------------------------------------------------------------------------------------------------------------- Assessment and Plan  Viral  URI with cough -May continue supportive care with OTC cold medications. -Remain well hydrated -Rx for prednisone for hoarsness/congestion.  -Call if not improving or return for worsening symptoms.   Erectile dysfunction -Has done well with tadalafil in the past, now generic.  -Rx for tadalafil.  Discussed potential side effects and avoidance of nitrates with this medication

## 2018-07-02 ENCOUNTER — Other Ambulatory Visit (INDEPENDENT_AMBULATORY_CARE_PROVIDER_SITE_OTHER): Payer: Self-pay | Admitting: Family

## 2018-07-02 NOTE — Telephone Encounter (Signed)
Ok for refill? 

## 2018-07-19 ENCOUNTER — Ambulatory Visit: Payer: Medicare PPO | Admitting: Family Medicine

## 2018-07-19 ENCOUNTER — Encounter: Payer: Self-pay | Admitting: Family Medicine

## 2018-07-19 VITALS — BP 100/62 | HR 71 | Temp 97.9°F | Wt 201.8 lb

## 2018-07-19 DIAGNOSIS — J069 Acute upper respiratory infection, unspecified: Secondary | ICD-10-CM

## 2018-07-19 DIAGNOSIS — B9789 Other viral agents as the cause of diseases classified elsewhere: Secondary | ICD-10-CM | POA: Diagnosis not present

## 2018-07-19 LAB — POCT INFLUENZA A: Rapid Influenza A Ag: NEGATIVE

## 2018-07-19 MED ORDER — BENZONATATE 100 MG PO CAPS: 100.0000 mg | ORAL_CAPSULE | Freq: Three times a day (TID) | ORAL | 0 refills | Status: DC | PRN

## 2018-07-19 NOTE — Addendum Note (Signed)
Addended by: Dominic Pea on: 07/19/2018 03:29 PM   Modules accepted: Orders

## 2018-07-19 NOTE — Assessment & Plan Note (Signed)
Symptomatic therapy suggested: push fluids, rest and return office visit prn if symptoms persist or worsen.  Rx for tessalon perles.  Lack of antibiotic effectiveness discussed with him. Call or return to clinic prn if these symptoms worsen or fail to improve as anticipated.

## 2018-07-19 NOTE — Patient Instructions (Signed)
-  Try tessalon perles for cough.  You may also use over the counter cold and flu medications. Stay well hydrated.  Let me know if symptoms worsen.    Upper Respiratory Infection, Adult Most upper respiratory infections (URIs) are caused by a virus. A URI affects the nose, throat, and upper air passages. The most common type of URI is often called "the common cold." Follow these instructions at home:  Take medicines only as told by your doctor.  Gargle warm saltwater or take cough drops to comfort your throat as told by your doctor.  Use a warm mist humidifier or inhale steam from a shower to increase air moisture. This may make it easier to breathe.  Drink enough fluid to keep your pee (urine) clear or pale yellow.  Eat soups and other clear broths.  Have a healthy diet.  Rest as needed.  Go back to work when your fever is gone or your doctor says it is okay. ? You may need to stay home longer to avoid giving your URI to others. ? You can also wear a face mask and wash your hands often to prevent spread of the virus.  Use your inhaler more if you have asthma.  Do not use any tobacco products, including cigarettes, chewing tobacco, or electronic cigarettes. If you need help quitting, ask your doctor. Contact a doctor if:  You are getting worse, not better.  Your symptoms are not helped by medicine.  You have chills.  You are getting more short of breath.  You have brown or red mucus.  You have yellow or brown discharge from your nose.  You have pain in your face, especially when you bend forward.  You have a fever.  You have puffy (swollen) neck glands.  You have pain while swallowing.  You have white areas in the back of your throat. Get help right away if:  You have very bad or constant: ? Headache. ? Ear pain. ? Pain in your forehead, behind your eyes, and over your cheekbones (sinus pain). ? Chest pain.  You have long-lasting (chronic) lung disease and  any of the following: ? Wheezing. ? Long-lasting cough. ? Coughing up blood. ? A change in your usual mucus.  You have a stiff neck.  You have changes in your: ? Vision. ? Hearing. ? Thinking. ? Mood. This information is not intended to replace advice given to you by your health care provider. Make sure you discuss any questions you have with your health care provider. Document Released: 02/11/2008 Document Revised: 04/27/2016 Document Reviewed: 11/30/2013 Elsevier Interactive Patient Education  2018 [employer].

## 2018-07-19 NOTE — Progress Notes (Signed)
Aaron Mendez - 72 y.o. male MRN [id number]  Date of birth: 02-Jul-1946  Subjective Chief Complaint  Patient presents with  . Cough  . Headache    HPI Aaron Mendez is a 72 y.o. male who complains of congestion, sore throat, nasal blockage, dry cough, myalgias, headache and chills for 2 days. He denies a history of chest pain, fevers, nausea, vomiting, shortness of breath  and sputum production and denies a history of asthma. Patient does not smoke cigarettes.  He has not tried anything for treatment.  Reports that co-worker has similar symptoms.   ROS:  A comprehensive ROS was completed and negative except as noted per HPI    No Known Allergies  Past Medical History:  Diagnosis Date  . Bilateral primary osteoarthritis of knee   . Sickle cell trait (HCC)     No past surgical history on file.  Social History   Socioeconomic History  . Marital status: Single    Spouse name: Not on file  . Number of children: Not on file  . Years of education: Not on file  . Highest education level: Not on file  Occupational History  . Not on file  Social Needs  . Financial resource strain: Not on file  . Food insecurity:    Worry: Not on file    Inability: Not on file  . Transportation needs:    Medical: Not on file    Non-medical: Not on file  Tobacco Use  . Smoking status: Never Smoker  . Smokeless tobacco: Never Used  Substance and Sexual Activity  . Alcohol use: No  . Drug use: No  . Sexual activity: Yes  Lifestyle  . Physical activity:    Days per week: Not on file    Minutes per session: Not on file  . Stress: Not on file  Relationships  . Social connections:    Talks on phone: Not on file    Gets together: Not on file    Attends religious service: Not on file    Active member of club or organization: Not on file    Attends meetings of clubs or organizations: Not on file    Relationship status: Not on file  Other Topics Concern  . Not on file  Social History  Narrative  . Not on file    Family History  Problem Relation Age of Onset  . Sickle cell anemia Son     Health Maintenance  Topic Date Due  . Janet Berlin  11/06/1963  . COLONOSCOPY  11/05/1994  . INFLUENZA VACCINE  04/08/2018  . PNA vac Low Risk Adult (2 of 2 - PPSV23) 12/22/2018  . Hepatitis C Screening  Completed    ----------------------------------------------------------------------------------------------------------------------------------------------------------------------------------------------------------------- Physical Exam BP 100/62   Pulse 71   Temp 97.9 F (36.6 C) (Oral)   Wt 201 lb 12.8 oz (91.5 kg)   SpO2 94%   BMI 28.96 kg/m   Physical Exam  Constitutional: He is oriented to person, place, and time. He appears well-nourished. No distress.  HENT:  Head: Normocephalic and atraumatic.  Mouth/Throat: Oropharynx is clear and moist. No oropharyngeal exudate.  Eyes: No scleral icterus.  Neck: Neck supple. No thyromegaly present.  Cardiovascular: Normal rate, regular rhythm and normal heart sounds.  Pulmonary/Chest: Effort normal and breath sounds normal.  Musculoskeletal: He exhibits no tenderness.  Lymphadenopathy:    He has no cervical adenopathy.  Neurological: He is alert and oriented to person, place, and time.  Skin: Skin is warm and dry.  No rash noted.  Psychiatric: He has a normal mood and affect. His behavior is normal.    ------------------------------------------------------------------------------------------------------------------------------------------------------------------------------------------------------------------- Assessment and Plan  Viral URI with cough Symptomatic therapy suggested: push fluids, rest and return office visit prn if symptoms persist or worsen.  Rx for tessalon perles.  Lack of antibiotic effectiveness discussed with him. Call or return to clinic prn if these symptoms worsen or fail to improve as  anticipated.

## 2018-09-11 ENCOUNTER — Other Ambulatory Visit (INDEPENDENT_AMBULATORY_CARE_PROVIDER_SITE_OTHER): Payer: Self-pay | Admitting: Orthopaedic Surgery

## 2018-09-13 NOTE — Telephone Encounter (Signed)
20

## 2018-09-16 NOTE — Telephone Encounter (Signed)
Called into pharm  

## 2018-10-29 ENCOUNTER — Other Ambulatory Visit: Payer: Self-pay | Admitting: Family Medicine

## 2019-02-04 ENCOUNTER — Ambulatory Visit (INDEPENDENT_AMBULATORY_CARE_PROVIDER_SITE_OTHER): Payer: Medicare PPO | Admitting: Family Medicine

## 2019-02-04 ENCOUNTER — Encounter: Payer: Self-pay | Admitting: Family Medicine

## 2019-02-04 DIAGNOSIS — L237 Allergic contact dermatitis due to plants, except food: Secondary | ICD-10-CM | POA: Insufficient documentation

## 2019-02-04 MED ORDER — PREDNISONE 10 MG PO TABS: 10.0000 mg | ORAL_TABLET | Freq: Every day | ORAL | 0 refills | Status: DC

## 2019-02-04 NOTE — Assessment & Plan Note (Signed)
-  Start prednisone taper.  -May continue calamine as needed -Discussed symptoms of infection and to call back if not improving or worsening.

## 2019-02-04 NOTE — Progress Notes (Signed)
Aaron Mendez - 73 y.o. male MRN [id number]  Date of birth: 10-05-1945   This visit type was conducted due to national recommendations for restrictions regarding the COVID-19 Pandemic (e.g. social distancing).  This format is felt to be most appropriate for this patient at this time.  All issues noted in this document were discussed and addressed.  No physical exam was performed (except for noted visual exam findings with Video Visits).  I discussed the limitations of evaluation and management by telemedicine and the availability of in person appointments. The patient expressed understanding and agreed to proceed.  I connected with@ on 02/04/19 at  3:00 PM EDT by a video enabled telemedicine application and verified that I am speaking with the correct person using two identifiers.   Patient Location: [facility] [address] [address]  [place] [address]   Provider location:   Home office  Chief Complaint  Patient presents with  . Rash    picking up briks, tocuhed posion ivy, 72hrs onset . legs/arms/ABD    HPI  Aaron Mendez is a 73 y.o. male who presents via audio/video conferencing for a telehealth visit today.  He has complaint of rash that he believes is poison ivy.  He reports that he was moving some bricks around and came in contact with poison ivy.  Rash started about 72 hours ago.  Areas are raised and blister like.  They are very itchy and located on abdomen, bilateral arms and legs. He is trying calamine lotion which has provided some relief.  He denies pain, bleeding or swelling.    ROS:  A comprehensive ROS was completed and negative except as noted per HPI  Past Medical History:  Diagnosis Date  . Bilateral primary osteoarthritis of knee   . Sickle cell trait (HCC)     No past surgical history on file.  Family History  Problem Relation Age of Onset  . Sickle cell anemia Son     Social History   Socioeconomic History  . Marital status: Single    Spouse name: Not on file  .  Number of children: Not on file  . Years of education: Not on file  . Highest education level: Not on file  Occupational History  . Not on file  Social Needs  . Financial resource strain: Not on file  . Food insecurity:    Worry: Not on file    Inability: Not on file  . Transportation needs:    Medical: Not on file    Non-medical: Not on file  Tobacco Use  . Smoking status: Never Smoker  . Smokeless tobacco: Never Used  Substance and Sexual Activity  . Alcohol use: No  . Drug use: No  . Sexual activity: Yes  Lifestyle  . Physical activity:    Days per week: Not on file    Minutes per session: Not on file  . Stress: Not on file  Relationships  . Social connections:    Talks on phone: Not on file    Gets together: Not on file    Attends religious service: Not on file    Active member of club or organization: Not on file    Attends meetings of clubs or organizations: Not on file    Relationship status: Not on file  . Intimate partner violence:    Fear of current or ex partner: Not on file    Emotionally abused: Not on file    Physically abused: Not on file    Forced sexual  activity: Not on file  Other Topics Concern  . Not on file  Social History Narrative  . Not on file     Current Outpatient Medications:  .  atorvastatin (LIPITOR) 20 MG tablet, TAKE 1 TABLET(20 MG) BY MOUTH DAILY, Disp: 90 tablet, Rfl: 2 .  benzonatate (TESSALON) 100 MG capsule, Take 1-2 capsules (100-200 mg total) by mouth 3 (three) times daily as needed for cough., Disp: 30 capsule, Rfl: 0 .  diclofenac (VOLTAREN) 75 MG EC tablet, TAKE 1 TABLET(75 MG) BY MOUTH TWICE DAILY WITH MEALS AS NEEDED FOR MILD PAIN OR MODERATE PAIN, Disp: 180 tablet, Rfl: 0 .  diclofenac sodium (VOLTAREN) 1 % GEL, Apply 2 g topically 4 (four) times daily., Disp: 1 Tube, Rfl: 5 .  ibuprofen (ADVIL,MOTRIN) 200 MG tablet, Take 400 mg by mouth every 6 (six) hours as needed for moderate pain., Disp: , Rfl:  .  meloxicam (MOBIC)  7.5 MG tablet, TAKE 2 TABLETS BY MOUTH DAILY AS NEEDED FOR PAIN. TAKE ONCE YOU'VE COMPLETED STEROID PACK, Disp: 180 tablet, Rfl: 2 .  methocarbamol (ROBAXIN) 500 MG tablet, Take 1 tablet (500 mg total) by mouth every 6 (six) hours as needed for muscle spasms., Disp: 30 tablet, Rfl: 2 .  tadalafil (ADCIRCA/CIALIS) 20 MG tablet, Take 0.5-1 tablets (10-20 mg total) by mouth every other day as needed for erectile dysfunction., Disp: 30 tablet, Rfl: 3 .  traMADol (ULTRAM) 50 MG tablet, TAKE 1 TO 2 TABLETS BY MOUTH THREE TIMES DAILY AS NEEDED, Disp: 20 tablet, Rfl: 0  EXAM:  VITALS per patient if applicable: Ht 5\' 10"  (1.778 m)   Wt 201 lb (91.2 kg)   BMI 28.84 kg/m   GENERAL: alert, oriented, appears well and in no acute distress  HEENT: atraumatic, conjunttiva clear, no obvious abnormalities on inspection of external nose and ears  NECK: normal movements of the head and neck  LUNGS: on inspection no signs of respiratory distress, breathing rate appears normal, no obvious gross SOB, gasping or wheezing  CV: no obvious cyanosis  MS: moves all visible extremities without noticeable abnormality  PSYCH/NEURO: pleasant and cooperative, no obvious depression or anxiety, speech and thought processing grossly intact  ASSESSMENT AND PLAN:  Discussed the following assessment and plan:  Poison ivy dermatitis -Start prednisone taper.  -May continue calamine as needed -Discussed symptoms of infection and to call back if not improving or worsening.        I discussed the assessment and treatment plan with the patient. The patient was provided an opportunity to ask questions and all were answered. The patient agreed with the plan and demonstrated an understanding of the instructions.   The patient was advised to call back or seek an in-person evaluation if the symptoms worsen or if the condition fails to improve as anticipated.  Aaron Coombeody Azari Janssens, DO

## 2019-04-05 DIAGNOSIS — M25571 Pain in right ankle and joints of right foot: Secondary | ICD-10-CM | POA: Diagnosis not present

## 2019-04-05 DIAGNOSIS — M722 Plantar fascial fibromatosis: Secondary | ICD-10-CM | POA: Diagnosis not present

## 2019-04-05 DIAGNOSIS — M71572 Other bursitis, not elsewhere classified, left ankle and foot: Secondary | ICD-10-CM | POA: Diagnosis not present

## 2019-04-05 DIAGNOSIS — M71571 Other bursitis, not elsewhere classified, right ankle and foot: Secondary | ICD-10-CM | POA: Diagnosis not present

## 2019-04-05 DIAGNOSIS — M25572 Pain in left ankle and joints of left foot: Secondary | ICD-10-CM | POA: Diagnosis not present

## 2019-04-05 DIAGNOSIS — M79671 Pain in right foot: Secondary | ICD-10-CM | POA: Diagnosis not present

## 2019-04-05 DIAGNOSIS — M79672 Pain in left foot: Secondary | ICD-10-CM | POA: Diagnosis not present

## 2019-04-19 DIAGNOSIS — M722 Plantar fascial fibromatosis: Secondary | ICD-10-CM | POA: Diagnosis not present

## 2019-04-19 DIAGNOSIS — M71572 Other bursitis, not elsewhere classified, left ankle and foot: Secondary | ICD-10-CM | POA: Diagnosis not present

## 2019-04-22 ENCOUNTER — Telehealth: Payer: Self-pay

## 2019-04-22 NOTE — Telephone Encounter (Signed)
Questions for Screening COVID-19  Symptom onset: n/a  Travel or Contacts:no  During this illness, did/does the patient experience any of the following symptoms? Fever >100.59F []   Yes [x]   No []   Unknown Subjective fever (felt feverish) []   Yes [x]   No []   Unknown Chills []   Yes [x]   No []   Unknown Muscle aches (myalgia) []   Yes [x]   No []   Unknown Runny nose (rhinorrhea) []   Yes [x]   No []   Unknown Sore throat []   Yes [x]   No []   Unknown Cough (new onset or worsening of chronic cough) []   Yes [x]   No []   Unknown Shortness of breath (dyspnea) []   Yes [x]   No []   Unknown Nausea or vomiting []   Yes [x]   No []   Unknown Headache []   Yes [x]   No []   Unknown Abdominal pain  []   Yes [x]   No []   Unknown Diarrhea (?3 loose/looser than normal stools/24hr period) []   Yes [x]   No []   Unknown Other, specify:  Patient risk factors: Smoker? []   Current []   Former []   Never If male, currently pregnant? []   Yes []   No  Patient Active Problem List   Diagnosis Date Noted  . Poison ivy dermatitis 02/04/2019  . Viral URI with cough 06/29/2018  . Erectile dysfunction 06/29/2018  . Encounter for well adult exam with abnormal findings 12/21/2017  . Enlarged prostate on rectal examination 12/21/2017  . Unilateral primary osteoarthritis, left knee 12/21/2017    Plan:  []   High risk for COVID-19 with red flags go to ED (with CP, SOB, weak/lightheaded, or fever > 101.5). Call ahead.  []   High risk for COVID-19 but stable. Inform provider and coordinate time for [facility] visit.   []   No red flags but URI signs or symptoms okay for [facility] visit.

## 2019-04-25 ENCOUNTER — Ambulatory Visit: Payer: Medicare PPO | Admitting: Family Medicine

## 2019-04-27 ENCOUNTER — Ambulatory Visit: Payer: Medicare PPO | Admitting: Family Medicine

## 2019-05-02 ENCOUNTER — Other Ambulatory Visit: Payer: Self-pay

## 2019-05-03 ENCOUNTER — Ambulatory Visit: Payer: Medicare PPO | Admitting: Family Medicine

## 2019-05-05 ENCOUNTER — Telehealth: Payer: Self-pay | Admitting: Family Medicine

## 2019-05-05 NOTE — Telephone Encounter (Signed)

## 2019-05-06 ENCOUNTER — Ambulatory Visit (INDEPENDENT_AMBULATORY_CARE_PROVIDER_SITE_OTHER): Payer: Medicare PPO | Admitting: Family Medicine

## 2019-05-06 ENCOUNTER — Other Ambulatory Visit: Payer: Self-pay

## 2019-05-06 ENCOUNTER — Encounter: Payer: Self-pay | Admitting: Family Medicine

## 2019-05-06 VITALS — BP 120/66 | HR 60 | Temp 97.0°F | Ht 70.0 in | Wt 192.2 lb

## 2019-05-06 DIAGNOSIS — M545 Low back pain, unspecified: Secondary | ICD-10-CM | POA: Insufficient documentation

## 2019-05-06 MED ORDER — NAPROXEN 500 MG PO TABS: 500.0000 mg | ORAL_TABLET | Freq: Two times a day (BID) | ORAL | 0 refills | Status: DC

## 2019-05-06 NOTE — Patient Instructions (Signed)

## 2019-05-06 NOTE — Progress Notes (Signed)
Aaron Mendez - 73 y.o. male MRN [id number]  Date of birth: 19-Oct-1945  Subjective Chief Complaint  Patient presents with  . Back Pain    pt is c/o of back pain both side/ going on 3 wks/take ibuprofen--help little/no flu shot    HPI Aaron Mendez is 73 y.o. male here today with complaint of bilateral low back back/posterior hip pain.  He reports that pain started about 3 weeks ago after moving a bag of mortar.  He has bene taking ibuprofen  Which helps some but doesn't fully resolve.  Pain is worse when first getting up in the morning, describes as a stiffness.   He denies radiation of pain into buttocks or legs, numbness, weakess.  He has not had any changes to bowels or urinary symptoms.   ROS:  A comprehensive ROS was completed and negative except as noted per HPI  No Known Allergies  Past Medical History:  Diagnosis Date  . Bilateral primary osteoarthritis of knee   . Sickle cell trait ([place])     No past surgical history on file.  Social History   Socioeconomic History  . Marital status: Single    Spouse name: Not on file  . Number of children: Not on file  . Years of education: Not on file  . Highest education level: Not on file  Occupational History  . Not on file  Social Needs  . Financial resource strain: Not on file  . Food insecurity    Worry: Not on file    Inability: Not on file  . Transportation needs    Medical: Not on file    Non-medical: Not on file  Tobacco Use  . Smoking status: Never Smoker  . Smokeless tobacco: Never Used  Substance and Sexual Activity  . Alcohol use: No  . Drug use: No  . Sexual activity: Yes  Lifestyle  . Physical activity    Days per week: Not on file    Minutes per session: Not on file  . Stress: Not on file  Relationships  . Social Herbalist on phone: Not on file    Gets together: Not on file    Attends religious service: Not on file    Active member of club or organization: Not on file    Attends meetings  of clubs or organizations: Not on file    Relationship status: Not on file  Other Topics Concern  . Not on file  Social History Narrative  . Not on file    Family History  Problem Relation Age of Onset  . Sickle cell anemia Son     Health Maintenance  Topic Date Due  . Samul Dada  11/06/1963  . COLONOSCOPY  11/05/1994  . PNA vac Low Risk Adult (2 of 2 - PPSV23) 12/22/2018  . INFLUENZA VACCINE  04/09/2019  . Hepatitis C Screening  Completed    ----------------------------------------------------------------------------------------------------------------------------------------------------------------------------------------------------------------- Physical Exam BP 120/66   Pulse 60   Temp (!) 97 F (36.1 C) (Tympanic)   Ht 5\' 10"  (1.778 m)   Wt 192 lb 3.2 oz (87.2 kg)   SpO2 97%   BMI 27.58 kg/m   Physical Exam Constitutional:      Appearance: Normal appearance.  HENT:     Head: Normocephalic and atraumatic.  Cardiovascular:     Rate and Rhythm: Normal rate and regular rhythm.  Pulmonary:     Effort: Pulmonary effort is normal.     Breath sounds: Normal breath sounds.  Abdominal:     General: Abdomen is flat. There is no distension.  Musculoskeletal:     Comments: L spine normal to inspection and palpation.  No step off palpated.  No significant spasm or tenderness of paraspinals noted.  ROM is good ROM of hips are normal.    Skin:    General: Skin is warm and dry.     Findings: No rash.  Neurological:     General: No focal deficit present.     Mental Status: He is alert.  Psychiatric:        Mood and Affect: Mood normal.        Behavior: Behavior normal.     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   -Recommend naproxen BID as needed with  -Referral entered to physical therapy.

## 2019-05-06 NOTE — Assessment & Plan Note (Signed)
-  Likely 2/2 to OA/DJD of the back.   -Recommend naproxen BID as needed with  -Referral entered to physical therapy.

## 2019-05-11 DIAGNOSIS — M722 Plantar fascial fibromatosis: Secondary | ICD-10-CM | POA: Diagnosis not present

## 2019-05-11 DIAGNOSIS — M71572 Other bursitis, not elsewhere classified, left ankle and foot: Secondary | ICD-10-CM | POA: Diagnosis not present

## 2019-05-23 ENCOUNTER — Other Ambulatory Visit: Payer: Self-pay | Admitting: Family Medicine

## 2019-05-23 MED ORDER — ATORVASTATIN CALCIUM 20 MG PO TABS: ORAL_TABLET | ORAL | 0 refills | Status: DC

## 2019-05-23 NOTE — Telephone Encounter (Signed)
Requested Prescriptions  Pending Prescriptions Disp Refills  . atorvastatin (LIPITOR) 20 MG tablet 90 tablet 0    Sig: TAKE 1 TABLET(20 MG) BY MOUTH DAILY     Cardiovascular:  Antilipid - Statins Failed - 05/23/2019  4:36 PM      Failed - Total Cholesterol in normal range and within 360 days    Cholesterol  Date Value Ref Range Status  12/21/2017 221 (H) 0 - 200 mg/dL Final    Comment:    ATP III Classification       Desirable:  < 200 mg/dL               Borderline High:  200 - 239 mg/dL          High:  > = 240 mg/dL         Failed - LDL in normal range and within 360 days    LDL Cholesterol  Date Value Ref Range Status  12/21/2017 148 (H) 0 - 99 mg/dL Final         Failed - HDL in normal range and within 360 days    HDL  Date Value Ref Range Status  12/21/2017 49.50 >39.00 mg/dL Final         Failed - Triglycerides in normal range and within 360 days    Triglycerides  Date Value Ref Range Status  12/21/2017 117.0 0.0 - 149.0 mg/dL Final    Comment:    Normal:  <150 mg/dLBorderline High:  150 - 199 mg/dL         Passed - Patient is not pregnant      Passed - Valid encounter within last 12 months    Recent Outpatient Visits          2 weeks ago Acute bilateral low back pain without sciatica   [facility] [place], [place], DO   3 months ago Poison ivy dermatitis   [facility] [place] Matthews, [place], DO   10 months ago Viral URI with cough   [facility] Matthews, [place], DO   10 months ago Viral URI with cough   [facility] [place], [place], DO   1 year ago Encounter for well adult exam with abnormal findings   [facility] [place], [place], [place]

## 2019-05-23 NOTE — Telephone Encounter (Signed)
Medication:  atorvastatin (LIPITOR) 20 MG tablet    Patient is requesting a refill of this medication.    Pharmacy:  [facility] [place], [place] [phone] (Phone) [phone] (Fax)

## 2019-06-03 ENCOUNTER — Other Ambulatory Visit: Payer: Self-pay

## 2019-06-07 MED ORDER — NAPROXEN 500 MG PO TABS: 500.0000 mg | ORAL_TABLET | Freq: Two times a day (BID) | ORAL | 0 refills | Status: DC

## 2019-06-08 ENCOUNTER — Ambulatory Visit: Payer: Medicare PPO | Attending: Family Medicine

## 2019-07-13 ENCOUNTER — Other Ambulatory Visit: Payer: Self-pay

## 2019-07-13 ENCOUNTER — Other Ambulatory Visit: Payer: Self-pay | Admitting: Family Medicine

## 2019-07-13 DIAGNOSIS — M545 Low back pain, unspecified: Secondary | ICD-10-CM

## 2019-07-28 ENCOUNTER — Ambulatory Visit: Payer: Medicare PPO | Attending: Family Medicine | Admitting: Physical Therapy

## 2019-08-19 ENCOUNTER — Other Ambulatory Visit: Payer: Self-pay | Admitting: Family Medicine

## 2019-08-22 DIAGNOSIS — H35033 Hypertensive retinopathy, bilateral: Secondary | ICD-10-CM | POA: Diagnosis not present

## 2019-08-22 DIAGNOSIS — H18513 Endothelial corneal dystrophy, bilateral: Secondary | ICD-10-CM | POA: Diagnosis not present

## 2019-08-22 DIAGNOSIS — H2513 Age-related nuclear cataract, bilateral: Secondary | ICD-10-CM | POA: Diagnosis not present

## 2019-08-22 DIAGNOSIS — H40023 Open angle with borderline findings, high risk, bilateral: Secondary | ICD-10-CM | POA: Diagnosis not present

## 2019-10-04 ENCOUNTER — Encounter: Payer: Medicare PPO | Admitting: Family Medicine

## 2019-10-04 ENCOUNTER — Encounter: Payer: Self-pay | Admitting: Family Medicine

## 2019-10-04 ENCOUNTER — Telehealth (INDEPENDENT_AMBULATORY_CARE_PROVIDER_SITE_OTHER): Payer: Medicare PPO | Admitting: Family Medicine

## 2019-10-04 DIAGNOSIS — M545 Low back pain: Secondary | ICD-10-CM

## 2019-10-04 MED ORDER — NAPROXEN 500 MG PO TABS: ORAL_TABLET | ORAL | 1 refills | Status: DC

## 2019-10-04 NOTE — Progress Notes (Signed)
Aaron Mendez - 74 y.o. male MRN [id number]  Date of birth: 1945/11/22   This visit type was conducted due to national recommendations for restrictions regarding the COVID-19 Pandemic (e.g. social distancing).  This format is felt to be most appropriate for this patient at this time.  All issues noted in this document were discussed and addressed.  No physical exam was performed (except for noted visual exam findings with Video Visits).  I discussed the limitations of evaluation and management by telemedicine and the availability of in person appointments. The patient expressed understanding and agreed to proceed.  I connected with@ on 10/04/19 at  1:20 PM EST by a video enabled telemedicine application and verified that I am speaking with the correct person using two identifiers.  Present at visit: Luetta Nutting, DO Vivi Ferns   Patient Location: [place] [id number]   Provider location:   [place]  Chief Complaint  Patient presents with  . Hip Pain    Pt c/o hip pain on both sides x 3week.    HPI  Aaron Mendez is a 74 y.o. male who presents via audio/video conferencing for a telehealth visit today.  He has complaint of posterior hip/back pain as well as knee pain for the past 3 weeks.  I saw him for this in 04/2019 and he reports symptoms are similar again this time.  He was prescribed naproxen previously and had good relief with this.  He denies radiation of pain, urinary symptoms, changes to bowels, joint swelling, fever or chills.    ROS:  A comprehensive ROS was completed and negative except as noted per HPI  Past Medical History:  Diagnosis Date  . Bilateral primary osteoarthritis of knee   . Sickle cell trait ([place])     History reviewed. No pertinent surgical history.  Family History  Problem Relation Age of Onset  . Sickle cell anemia Son     Social History   Socioeconomic History  . Marital status: Single    Spouse name: Not  on file  . Number of children: Not on file  . Years of education: Not on file  . Highest education level: Not on file  Occupational History  . Not on file  Tobacco Use  . Smoking status: Never Smoker  . Smokeless tobacco: Never Used  Substance and Sexual Activity  . Alcohol use: No  . Drug use: No  . Sexual activity: Yes  Other Topics Concern  . Not on file  Social History Narrative  . Not on file   Social Determinants of Health   Financial Resource Strain:   . Difficulty of Paying Living Expenses: Not on file  Food Insecurity:   . Worried About Charity fundraiser in the Last Year: Not on file  . Ran Out of Food in the Last Year: Not on file  Transportation Needs:   . Lack of Transportation (Medical): Not on file  . Lack of Transportation (Non-Medical): Not on file  Physical Activity:   . Days of Exercise per Week: Not on file  . Minutes of Exercise per Session: Not on file  Stress:   . Feeling of Stress : Not on file  Social Connections:   . Frequency of Communication with Friends and Family: Not on file  . Frequency of Social Gatherings with Friends and Family: Not on file  . Attends Religious Services: Not on file  . Active Member of Clubs or Organizations: Not on file  .  Attends Banker Meetings: Not on file  . Marital Status: Not on file  Intimate Partner Violence:   . Fear of Current or Ex-Partner: Not on file  . Emotionally Abused: Not on file  . Physically Abused: Not on file  . Sexually Abused: Not on file     Current Outpatient Medications:  .  atorvastatin (LIPITOR) 20 MG tablet, TAKE 1 TABLET(20 MG) BY MOUTH DAILY, Disp: 90 tablet, Rfl: 0 .  ibuprofen (ADVIL) 800 MG tablet, , Disp: , Rfl:  .  brimonidine (ALPHAGAN) 0.2 % ophthalmic solution, 1 drop 2 (two) times daily., Disp: , Rfl:  .  naproxen (NAPROSYN) 500 MG tablet, TAKE 1 TABLET(500 MG) BY MOUTH TWICE DAILY WITH A MEAL, Disp: 60 tablet, Rfl: 1  EXAM:  VITALS per patient if  applicable: Ht 5\' 10"  (1.778 m)   Wt 192 lb (87.1 kg)   BMI 27.55 kg/m   GENERAL: alert, oriented, appears well and in no acute distress  HEENT: atraumatic, conjunttiva clear, no obvious abnormalities on inspection of external nose and ears  NECK: normal movements of the head and neck  LUNGS: on inspection no signs of respiratory distress, breathing rate appears normal, no obvious gross SOB, gasping or wheezing  CV: no obvious cyanosis  MS: moves all visible extremities without noticeable abnormality  PSYCH/NEURO: pleasant and cooperative, no obvious depression or anxiety, speech and thought processing grossly intact  ASSESSMENT AND PLAN:  Discussed the following assessment and plan:  Acute bilateral low back pain without sciatica Current symptoms similar to back pain seen 04/2019.  Naproxen 500mg  renewed Recommend HEP/Stretches, if continues to be an issue we discussed referral to PT.       I discussed the assessment and treatment plan with the patient. The patient was provided an opportunity to ask questions and all were answered. The patient agreed with the plan and demonstrated an understanding of the instructions.   The patient was advised to call back or seek an in-person evaluation if the symptoms worsen or if the condition fails to improve as anticipated.    05/2019, DO

## 2019-10-04 NOTE — Progress Notes (Signed)
This encounter was created in error - please disregard.

## 2019-10-04 NOTE — Patient Instructions (Signed)
Health Maintenance Due  Topic Date Due  . TETANUS/TDAP  11/06/1963  . COLONOSCOPY  11/05/1994  . PNA vac Low Risk Adult (2 of 2 - PPSV23) 12/22/2018    Depression screen PHQ 2/9 05/06/2019 12/21/2017  Decreased Interest 0 0  Down, Depressed, Hopeless 0 0  PHQ - 2 Score 0 0

## 2019-10-04 NOTE — Assessment & Plan Note (Signed)
Current symptoms similar to back pain seen 04/2019.  Naproxen 500mg  renewed Recommend HEP/Stretches, if continues to be an issue we discussed referral to PT.

## 2019-10-11 ENCOUNTER — Encounter (HOSPITAL_COMMUNITY): Payer: Self-pay | Admitting: Emergency Medicine

## 2019-10-11 ENCOUNTER — Emergency Department (HOSPITAL_COMMUNITY): Payer: Medicare PPO

## 2019-10-11 ENCOUNTER — Inpatient Hospital Stay (HOSPITAL_COMMUNITY)
Admission: EM | Admit: 2019-10-11 | Discharge: 2019-10-17 | DRG: 177 | Disposition: A | Discharge status: Home Health | Payer: Medicare PPO | Attending: Internal Medicine | Admitting: Internal Medicine

## 2019-10-11 ENCOUNTER — Other Ambulatory Visit: Payer: Self-pay

## 2019-10-11 ENCOUNTER — Telehealth: Payer: Medicare PPO | Admitting: Family Medicine

## 2019-10-11 DIAGNOSIS — Z832 Family history of diseases of the blood and blood-forming organs and certain disorders involving the immune mechanism: Secondary | ICD-10-CM

## 2019-10-11 DIAGNOSIS — R531 Weakness: Secondary | ICD-10-CM | POA: Diagnosis not present

## 2019-10-11 DIAGNOSIS — Z79899 Other long term (current) drug therapy: Secondary | ICD-10-CM

## 2019-10-11 DIAGNOSIS — D573 Sickle-cell trait: Secondary | ICD-10-CM | POA: Diagnosis present

## 2019-10-11 DIAGNOSIS — T380X5A Adverse effect of glucocorticoids and synthetic analogues, initial encounter: Secondary | ICD-10-CM | POA: Diagnosis not present

## 2019-10-11 DIAGNOSIS — R32 Unspecified urinary incontinence: Secondary | ICD-10-CM | POA: Diagnosis present

## 2019-10-11 DIAGNOSIS — Z791 Long term (current) use of non-steroidal anti-inflammatories (NSAID): Secondary | ICD-10-CM

## 2019-10-11 DIAGNOSIS — I10 Essential (primary) hypertension: Secondary | ICD-10-CM | POA: Diagnosis present

## 2019-10-11 DIAGNOSIS — U071 COVID-19: Secondary | ICD-10-CM

## 2019-10-11 DIAGNOSIS — M170 Bilateral primary osteoarthritis of knee: Secondary | ICD-10-CM | POA: Diagnosis present

## 2019-10-11 DIAGNOSIS — E860 Dehydration: Secondary | ICD-10-CM | POA: Diagnosis present

## 2019-10-11 DIAGNOSIS — R739 Hyperglycemia, unspecified: Secondary | ICD-10-CM | POA: Diagnosis not present

## 2019-10-11 DIAGNOSIS — R7303 Prediabetes: Secondary | ICD-10-CM | POA: Diagnosis present

## 2019-10-11 DIAGNOSIS — J9601 Acute respiratory failure with hypoxia: Secondary | ICD-10-CM | POA: Diagnosis present

## 2019-10-11 DIAGNOSIS — E785 Hyperlipidemia, unspecified: Secondary | ICD-10-CM | POA: Diagnosis present

## 2019-10-11 DIAGNOSIS — Y92239 Unspecified place in hospital as the place of occurrence of the external cause: Secondary | ICD-10-CM | POA: Diagnosis not present

## 2019-10-11 LAB — CBC WITH DIFFERENTIAL/PLATELET
Abs Immature Granulocytes: 0.02 10*3/uL (ref 0.00–0.07)
Basophils Absolute: 0 10*3/uL (ref 0.0–0.1)
Basophils Relative: 0 %
Eosinophils Absolute: 0 10*3/uL (ref 0.0–0.5)
Eosinophils Relative: 0 %
HCT: 45.7 % (ref 39.0–52.0)
Hemoglobin: 14.2 g/dL (ref 13.0–17.0)
Immature Granulocytes: 0 %
Lymphocytes Relative: 24 %
Lymphs Abs: 1.5 10*3/uL (ref 0.7–4.0)
MCH: 28 pg (ref 26.0–34.0)
MCHC: 31.1 g/dL (ref 30.0–36.0)
MCV: 90.1 fL (ref 80.0–100.0)
Monocytes Absolute: 0.6 10*3/uL (ref 0.1–1.0)
Monocytes Relative: 9 %
Neutro Abs: 4.1 10*3/uL (ref 1.7–7.7)
Neutrophils Relative %: 67 %
Platelets: 197 10*3/uL (ref 150–400)
RBC: 5.07 MIL/uL (ref 4.22–5.81)
RDW: 12.2 % (ref 11.5–15.5)
WBC: 6.1 10*3/uL (ref 4.0–10.5)
nRBC: 0 % (ref 0.0–0.2)

## 2019-10-11 LAB — URINALYSIS, ROUTINE W REFLEX MICROSCOPIC
Bilirubin Urine: NEGATIVE
Glucose, UA: NEGATIVE mg/dL
Ketones, ur: NEGATIVE mg/dL
Leukocytes,Ua: NEGATIVE
Nitrite: NEGATIVE
Protein, ur: 100 mg/dL — AB
Specific Gravity, Urine: 1.014 (ref 1.005–1.030)
pH: 6 (ref 5.0–8.0)

## 2019-10-11 LAB — POC SARS CORONAVIRUS 2 AG -  ED: SARS Coronavirus 2 Ag: POSITIVE — AB

## 2019-10-11 LAB — BASIC METABOLIC PANEL
Anion gap: 12 (ref 5–15)
BUN: 13 mg/dL (ref 8–23)
CO2: 24 mmol/L (ref 22–32)
Calcium: 8.2 mg/dL — ABNORMAL LOW (ref 8.9–10.3)
Chloride: 102 mmol/L (ref 98–111)
Creatinine, Ser: 1.17 mg/dL (ref 0.61–1.24)
GFR calc Af Amer: >60 mL/min (ref >60)
GFR calc non Af Amer: >60 mL/min (ref >60)
Glucose, Bld: 114 mg/dL — ABNORMAL HIGH (ref 70–99)
Potassium: 4.1 mmol/L (ref 3.5–5.1)
Sodium: 138 mmol/L (ref 135–145)

## 2019-10-11 LAB — MAGNESIUM: Magnesium: 2.3 mg/dL (ref 1.7–2.4)

## 2019-10-11 IMAGING — DX DG CHEST 1V PORT
1 series · 1 of 1 positions shown · non-contrast
Comparison: Radiographs [DATE].

CLINICAL DATA: Loss of appetite for 1 week with fatigue. Diagnosed
with [TU] infection yesterday.

EXAM:
PORTABLE CHEST 1 VIEW

[chest ap]
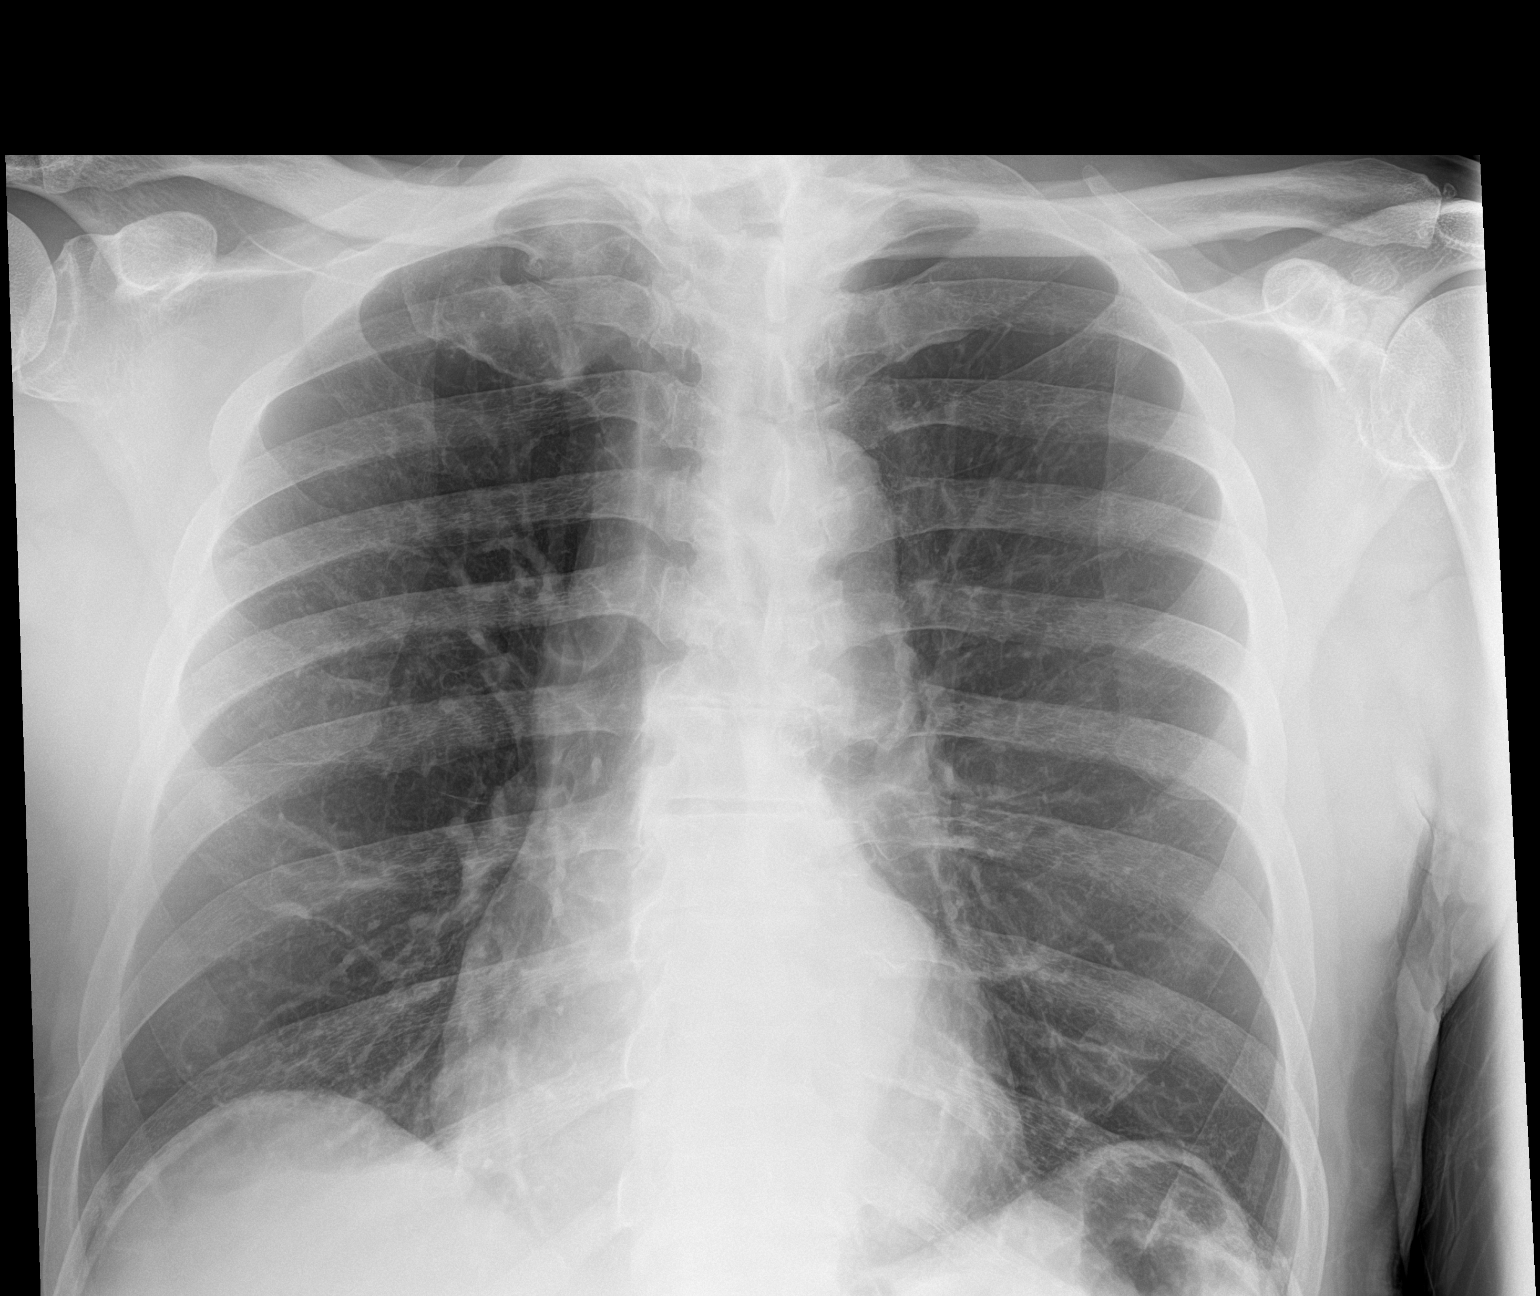

[1 of 1 positions shown; findings below may reference images not displayed]

FINDINGS: [TU] hours. The heart size and mediastinal contours are normal. The
lungs are clear. There is no pleural effusion or pneumothorax. No
acute osseous findings are identified.
IMPRESSION: No active cardiopulmonary process.

## 2019-10-11 MED ORDER — SODIUM CHLORIDE 0.9 % IV BOLUS
1000.0000 mL | Freq: Once | INTRAVENOUS | Status: AC
  Administered 2019-10-11: 16:00:00 1000 mL via INTRAVENOUS

## 2019-10-11 MED ORDER — SODIUM CHLORIDE 0.9 % IV BOLUS
1000.0000 mL | Freq: Once | INTRAVENOUS | Status: AC
  Administered 2019-10-11: 1000 mL via INTRAVENOUS

## 2019-10-11 MED ORDER — ACETAMINOPHEN 500 MG PO TABS
1000.0000 mg | ORAL_TABLET | Freq: Once | ORAL | Status: AC
  Administered 2019-10-11: 1000 mg via ORAL
  Filled 2019-10-11: qty 2

## 2019-10-11 NOTE — ED Triage Notes (Signed)
Pt states he tested covid + yesterday. Here for loss of appetite x1 week. States he feels tired. Denies any pain.

## 2019-10-11 NOTE — ED Notes (Signed)
PT's daughter informed nurse that [facility] but [facility] hasn't resulted yet. Pt is not a confirmed COVID+.

## 2019-10-11 NOTE — ED Provider Notes (Addendum)
[facility] EMERGENCY DEPARTMENT Provider Note   CSN: [id number] Arrival date & time: 10/11/19  1227     History Chief Complaint  Patient presents with  . loss of appetite, covid +    Gerhard Rappaport is a 74 y.o. male.  HPI      Joash Tony is a 74 y.o. male, patient with no pertinent past medical history, presenting to the ED with loss of appetite for about 2 weeks. Mild cough and urinary urgency.  Feels generally weak. States [facility].   Denies fever/chills, shortness of breath, chest pain, abdominal pain, N/V/C/D, hematochezia/melena, syncope, dizziness, dysuria, hematuria, flank/back pain, or any other complaints.      Past Medical History:  Diagnosis Date  . Bilateral primary osteoarthritis of knee   . Sickle cell trait [facility])     Patient Active Problem List   Diagnosis Date Noted  . Acute bilateral low back pain without sciatica 05/06/2019  . Poison ivy dermatitis 02/04/2019  . Viral URI with cough 06/29/2018  . Erectile dysfunction 06/29/2018  . Encounter for well adult exam with abnormal findings 12/21/2017  . Enlarged prostate on rectal examination 12/21/2017  . Unilateral primary osteoarthritis, left knee 12/21/2017    History reviewed. No pertinent surgical history.     Family History  Problem Relation Age of Onset  . Sickle cell anemia Son     Social History   Tobacco Use  . Smoking status: Never Smoker  . Smokeless tobacco: Never Used  Substance Use Topics  . Alcohol use: No  . Drug use: No    Home Medications Prior to Admission medications   Medication Sig Start Date End Date Taking? Authorizing Provider  atorvastatin (LIPITOR) 20 MG tablet TAKE 1 TABLET(20 MG) BY MOUTH DAILY Patient taking differently: Take 20 mg by mouth daily.  08/19/19   Luetta Nutting, DO  brimonidine (ALPHAGAN) 0.2 % ophthalmic solution 1 drop 2 (two) times daily. 08/22/19   [provider]  ibuprofen  (ADVIL) 800 MG tablet  07/07/19   [provider]  naproxen (NAPROSYN) 500 MG tablet TAKE 1 TABLET(500 MG) BY MOUTH TWICE DAILY WITH A MEAL 10/04/19   Luetta Nutting, DO    Allergies    Patient has no known allergies.  Review of Systems   Review of Systems  Constitutional: Positive for appetite change. Negative for chills, diaphoresis and fever.  Respiratory: Negative for cough and shortness of breath.   Cardiovascular: Negative for chest pain and leg swelling.  Gastrointestinal: Negative for abdominal pain, blood in stool, constipation, diarrhea, nausea and vomiting.  Genitourinary: Positive for urgency. Negative for discharge, dysuria, flank pain, frequency, hematuria, penile swelling, scrotal swelling and testicular pain.  Musculoskeletal: Negative for back pain.  Neurological: Positive for weakness (generalized). Negative for dizziness, syncope, light-headedness and headaches.  All other systems reviewed and are negative.   Physical Exam Updated Vital Signs BP 134/72 (BP Location: Right Arm)   Pulse 74   Temp 98.1 F (36.7 C) (Oral)   Resp 16   SpO2 94%   Physical Exam Vitals and nursing note reviewed.  Constitutional:      General: He is not in acute distress.    Appearance: He is well-developed. He is not diaphoretic.  HENT:     Head: Normocephalic and atraumatic.     Mouth/Throat:     Mouth: Mucous membranes are moist.     Pharynx: Oropharynx is clear.  Eyes:  Conjunctiva/sclera: Conjunctivae normal.  Cardiovascular:     Rate and Rhythm: Normal rate and regular rhythm.     Pulses: Normal pulses.          Radial pulses are 2+ on the right side and 2+ on the left side.       Posterior tibial pulses are 2+ on the right side and 2+ on the left side.     Heart sounds: Normal heart sounds.     Comments: Tactile temperature in the extremities appropriate and equal bilaterally. Pulmonary:     Effort: Pulmonary effort is normal. No respiratory distress.      Breath sounds: Normal breath sounds.  Abdominal:     Palpations: Abdomen is soft.     Tenderness: There is no abdominal tenderness. There is no guarding.  Musculoskeletal:     Cervical back: Neck supple.     Right lower leg: No edema.     Left lower leg: No edema.  Lymphadenopathy:     Cervical: No cervical adenopathy.  Skin:    General: Skin is warm and dry.  Neurological:     Mental Status: He is alert and oriented to person, place, and time.     Comments: No noted acute cognitive deficit. Sensation grossly intact to light touch in the extremities.   Grip strengths equal bilaterally.   Strength 5/5 in all extremities.  Coordination intact.  Handles oral secretions without noted difficulty.  No noted phonation or speech deficit. No facial droop.   Psychiatric:        Mood and Affect: Mood and affect normal.        Speech: Speech normal.        Behavior: Behavior normal.     ED Results / Procedures / Treatments   Labs (all labs ordered are listed, but only abnormal results are displayed) Labs Reviewed  BASIC METABOLIC PANEL - Abnormal; Notable for the following components:      Result Value   Glucose, Bld 114 (*)    Calcium 8.2 (*)    All other components within normal limits  URINALYSIS, ROUTINE W REFLEX MICROSCOPIC - Abnormal; Notable for the following components:   Hgb urine dipstick SMALL (*)    Protein, ur 100 (*)    Bacteria, UA RARE (*)    All other components within normal limits  POC SARS CORONAVIRUS 2 AG -  ED - Abnormal; Notable for the following components:   SARS Coronavirus 2 Ag POSITIVE (*)    All other components within normal limits  URINE CULTURE  CULTURE, BLOOD (ROUTINE X 2)  CULTURE, BLOOD (ROUTINE X 2)  CBC WITH DIFFERENTIAL/PLATELET  MAGNESIUM  LACTIC ACID, PLASMA  LACTIC ACID, PLASMA  D-DIMER, QUANTITATIVE (NOT AT [facility])  PROCALCITONIN  LACTATE DEHYDROGENASE  FERRITIN  TRIGLYCERIDES  FIBRINOGEN  C-REACTIVE PROTEIN    EKG EKG  Interpretation  Date/Time:  Tuesday October 11 2019 19:44:15 EST Ventricular Rate:  70 PR Interval:    QRS Duration: 76 QT Interval:  377 QTC Calculation: 407 R Axis:   61 Text Interpretation: Sinus rhythm Normal ECG Confirmed by Geoffery Lyons ([id number]) on 10/11/2019 9:41:08 PM   Radiology DG Chest Port 1 View  Result Date: 10/11/2019 CLINICAL DATA:  Loss of appetite for 1 week with fatigue. Diagnosed with COVID-19 infection yesterday. EXAM: PORTABLE CHEST 1 VIEW COMPARISON:  Radiographs 07/26/2015. FINDINGS: 1353 hours. The heart size and mediastinal contours are normal. The lungs are clear. There is no pleural effusion or pneumothorax. No acute  osseous findings are identified. IMPRESSION: No active cardiopulmonary process. Electronically Signed   By: Carey Bullocks M.D.   On: 10/11/2019 14:21    Procedures Procedures (including critical care time)  Medications Ordered in ED Medications  sodium chloride 0.9 % bolus 1,000 mL (has no administration in time range)  sodium chloride 0.9 % bolus 1,000 mL (0 mLs Intravenous Stopped 10/11/19 2045)  acetaminophen (TYLENOL) tablet 1,000 mg (1,000 mg Oral Given 10/11/19 2044)    ED Course  I have reviewed the triage vital signs and the nursing notes.  Pertinent labs & imaging results that were available during my care of the patient were reviewed by me and considered in my medical decision making (see chart for details).  Clinical Course as of Oct 11 2331  Tue Oct 11, 2019  1635 Spoke with patient's daughter, who is a Runner, broadcasting/film/video.  She states patient has been generally weak and mostly lying in bed for the past couple weeks.  He has had decreased appetite and overall feeling unwell. [facility], however, patient is unable to read and the print out actually said the results were inconclusive.   [SJ]  2000 RN notes urinary incontinence.  In and out cath with 300 cc drained.   [SJ]  2128 Called  to update patient's daughter.  No answer.  Left voicemail with callback number.   [SJ]  2133 Spoke with Dr. Toniann Fail, hospitalist. Requests COVID labs.   [SJ]    Clinical Course User Index [SJ] Alexius Hangartner, Hillard Danker, PA-C   MDM Rules/Calculators/A&P                      Patient presents with loss of appetite and feeling generally weak. Patient is nontoxic appearing, afebrile initially, not tachycardic, not tachypneic, not hypotensive, maintains adequate SPO2 on room air, and is in no apparent distress.  Noted to be febrile rectally. Covid positive.  No acute abnormality on chest x-ray. Unfortunately, patient is quite generally weak.  No focal deficits.  He becomes lightheaded and has orthostatic changes upon standing. Due to his positive Covid status, we would need to hydrate him gently.  We suspect he would benefit from observation admission for this hydration.  Covid specific labs pending at time of admission.  Findings and plan of care discussed with Emily Filbert, MD.   Vitals:   10/11/19 2145 10/11/19 2200 10/11/19 2215 10/11/19 2230  BP: 114/72 118/76 115/73 107/77  Pulse: 66 69 68 65  Resp: (!) 22 20 (!) 22 17  Temp:      TempSrc:      SpO2: 94% 95% 93% 92%   Orthostatic VS for the past 24 hrs:  BP- Lying Pulse- Lying BP- Sitting Pulse- Sitting BP- Standing at 0 minutes Pulse- Standing at 0 minutes  10/11/19 2118 107/74 70 94/74 90 (!) 87/76 81     Final Clinical Impression(s) / ED Diagnoses Final diagnoses:  Generalized weakness  COVID-19    Rx / DC Orders ED Discharge Orders    None       Concepcion Living 10/11/19 2333    Anselm Pancoast, PA-C 10/11/19 2334    Geoffery Lyons, MD 10/12/19 [phone]

## 2019-10-11 NOTE — ED Notes (Signed)
Daughter [name]) works in cath lab and would like to be called with updates and when pt ready for discharge.  Number in contacts.

## 2019-10-12 ENCOUNTER — Encounter (HOSPITAL_COMMUNITY): Payer: Self-pay | Admitting: Internal Medicine

## 2019-10-12 DIAGNOSIS — U071 COVID-19: Secondary | ICD-10-CM | POA: Diagnosis not present

## 2019-10-12 DIAGNOSIS — T380X5A Adverse effect of glucocorticoids and synthetic analogues, initial encounter: Secondary | ICD-10-CM | POA: Diagnosis not present

## 2019-10-12 DIAGNOSIS — Z79899 Other long term (current) drug therapy: Secondary | ICD-10-CM | POA: Diagnosis not present

## 2019-10-12 DIAGNOSIS — D573 Sickle-cell trait: Secondary | ICD-10-CM | POA: Diagnosis present

## 2019-10-12 DIAGNOSIS — R32 Unspecified urinary incontinence: Secondary | ICD-10-CM | POA: Diagnosis present

## 2019-10-12 DIAGNOSIS — E860 Dehydration: Secondary | ICD-10-CM

## 2019-10-12 DIAGNOSIS — R509 Fever, unspecified: Secondary | ICD-10-CM | POA: Diagnosis not present

## 2019-10-12 DIAGNOSIS — Z832 Family history of diseases of the blood and blood-forming organs and certain disorders involving the immune mechanism: Secondary | ICD-10-CM | POA: Diagnosis not present

## 2019-10-12 DIAGNOSIS — M170 Bilateral primary osteoarthritis of knee: Secondary | ICD-10-CM | POA: Diagnosis present

## 2019-10-12 DIAGNOSIS — I10 Essential (primary) hypertension: Secondary | ICD-10-CM | POA: Diagnosis present

## 2019-10-12 DIAGNOSIS — R7303 Prediabetes: Secondary | ICD-10-CM | POA: Diagnosis present

## 2019-10-12 DIAGNOSIS — J9601 Acute respiratory failure with hypoxia: Secondary | ICD-10-CM | POA: Diagnosis not present

## 2019-10-12 DIAGNOSIS — Z791 Long term (current) use of non-steroidal anti-inflammatories (NSAID): Secondary | ICD-10-CM | POA: Diagnosis not present

## 2019-10-12 DIAGNOSIS — R739 Hyperglycemia, unspecified: Secondary | ICD-10-CM | POA: Diagnosis not present

## 2019-10-12 DIAGNOSIS — Y92239 Unspecified place in hospital as the place of occurrence of the external cause: Secondary | ICD-10-CM | POA: Diagnosis not present

## 2019-10-12 DIAGNOSIS — E785 Hyperlipidemia, unspecified: Secondary | ICD-10-CM | POA: Diagnosis present

## 2019-10-12 DIAGNOSIS — R531 Weakness: Secondary | ICD-10-CM | POA: Diagnosis not present

## 2019-10-12 LAB — CBC WITH DIFFERENTIAL/PLATELET
Abs Immature Granulocytes: 0.01 10*3/uL (ref 0.00–0.07)
Basophils Absolute: 0 10*3/uL (ref 0.0–0.1)
Basophils Relative: 0 %
Eosinophils Absolute: 0 10*3/uL (ref 0.0–0.5)
Eosinophils Relative: 0 %
HCT: 41.3 % (ref 39.0–52.0)
Hemoglobin: 12.8 g/dL — ABNORMAL LOW (ref 13.0–17.0)
Immature Granulocytes: 0 %
Lymphocytes Relative: 25 %
Lymphs Abs: 1.2 10*3/uL (ref 0.7–4.0)
MCH: 28.3 pg (ref 26.0–34.0)
MCHC: 31 g/dL (ref 30.0–36.0)
MCV: 91.2 fL (ref 80.0–100.0)
Monocytes Absolute: 0.5 10*3/uL (ref 0.1–1.0)
Monocytes Relative: 9 %
Neutro Abs: 3.2 10*3/uL (ref 1.7–7.7)
Neutrophils Relative %: 66 %
Platelets: 185 10*3/uL (ref 150–400)
RBC: 4.53 MIL/uL (ref 4.22–5.81)
RDW: 12.1 % (ref 11.5–15.5)
WBC: 4.9 10*3/uL (ref 4.0–10.5)
nRBC: 0 % (ref 0.0–0.2)

## 2019-10-12 LAB — D-DIMER, QUANTITATIVE
D-Dimer, Quant: 1.39 ug/mL-FEU — ABNORMAL HIGH (ref 0.00–0.50)
D-Dimer, Quant: 1.99 ug/mL-FEU — ABNORMAL HIGH (ref 0.00–0.50)

## 2019-10-12 LAB — COMPREHENSIVE METABOLIC PANEL
ALT: 25 U/L (ref 0–44)
AST: 30 U/L (ref 15–41)
Albumin: 2.8 g/dL — ABNORMAL LOW (ref 3.5–5.0)
Alkaline Phosphatase: 31 U/L — ABNORMAL LOW (ref 38–126)
Anion gap: 8 (ref 5–15)
BUN: 13 mg/dL (ref 8–23)
CO2: 25 mmol/L (ref 22–32)
Calcium: 7.7 mg/dL — ABNORMAL LOW (ref 8.9–10.3)
Chloride: 107 mmol/L (ref 98–111)
Creatinine, Ser: 1.14 mg/dL (ref 0.61–1.24)
GFR calc Af Amer: >60 mL/min (ref >60)
GFR calc non Af Amer: >60 mL/min (ref >60)
Glucose, Bld: 133 mg/dL — ABNORMAL HIGH (ref 70–99)
Potassium: 4 mmol/L (ref 3.5–5.1)
Sodium: 140 mmol/L (ref 135–145)
Total Bilirubin: 1.2 mg/dL (ref 0.3–1.2)
Total Protein: 6 g/dL — ABNORMAL LOW (ref 6.5–8.1)

## 2019-10-12 LAB — URINE CULTURE: Culture: NO GROWTH

## 2019-10-12 LAB — PROCALCITONIN: Procalcitonin: <0.1 ng/mL

## 2019-10-12 LAB — C-REACTIVE PROTEIN
CRP: 2.2 mg/dL — ABNORMAL HIGH (ref <1.0)
CRP: 2.2 mg/dL — ABNORMAL HIGH (ref <1.0)

## 2019-10-12 LAB — TRIGLYCERIDES: Triglycerides: 82 mg/dL (ref <150)

## 2019-10-12 LAB — FERRITIN: Ferritin: 2702 ng/mL — ABNORMAL HIGH (ref 24–336)

## 2019-10-12 LAB — LACTIC ACID, PLASMA: Lactic Acid, Venous: 1.5 mmol/L (ref 0.5–1.9)

## 2019-10-12 LAB — CK: Total CK: 189 U/L (ref 49–397)

## 2019-10-12 LAB — TSH: TSH: 1.663 u[IU]/mL (ref 0.350–4.500)

## 2019-10-12 LAB — LACTATE DEHYDROGENASE: LDH: 269 U/L — ABNORMAL HIGH (ref 98–192)

## 2019-10-12 LAB — TROPONIN I (HIGH SENSITIVITY): Troponin I (High Sensitivity): 8 ng/L (ref <18)

## 2019-10-12 LAB — FIBRINOGEN: Fibrinogen: 543 mg/dL — ABNORMAL HIGH (ref 210–475)

## 2019-10-12 MED ORDER — ONDANSETRON HCL 4 MG PO TABS: 4.0000 mg | ORAL_TABLET | Freq: Four times a day (QID) | ORAL | Status: DC | PRN

## 2019-10-12 MED ORDER — ENOXAPARIN SODIUM 40 MG/0.4ML ~~LOC~~ SOLN
40.0000 mg | Freq: Every day | SUBCUTANEOUS | Status: DC
  Administered 2019-10-12 – 2019-10-17 (×6): 40 mg via SUBCUTANEOUS
  Filled 2019-10-12 (×6): qty 0.4

## 2019-10-12 MED ORDER — ATORVASTATIN CALCIUM 10 MG PO TABS: 20.0000 mg | ORAL_TABLET | Freq: Every day | ORAL | Status: DC

## 2019-10-12 MED ORDER — PNEUMOCOCCAL VAC POLYVALENT 25 MCG/0.5ML IJ INJ
0.5000 mL | INJECTION | INTRAMUSCULAR | Status: DC
  Filled 2019-10-12: qty 0.5

## 2019-10-12 MED ORDER — ACETAMINOPHEN 650 MG RE SUPP: 650.0000 mg | Freq: Four times a day (QID) | RECTAL | Status: DC | PRN

## 2019-10-12 MED ORDER — ACETAMINOPHEN 325 MG PO TABS
650.0000 mg | ORAL_TABLET | Freq: Four times a day (QID) | ORAL | Status: DC | PRN
  Administered 2019-10-12 (×2): 650 mg via ORAL
  Filled 2019-10-12 (×2): qty 2

## 2019-10-12 MED ORDER — HYDRALAZINE HCL 20 MG/ML IJ SOLN: 10.0000 mg | INTRAMUSCULAR | Status: DC | PRN

## 2019-10-12 MED ORDER — ONDANSETRON HCL 4 MG/2ML IJ SOLN
4.0000 mg | Freq: Four times a day (QID) | INTRAMUSCULAR | Status: DC | PRN
  Administered 2019-10-12: 4 mg via INTRAVENOUS
  Filled 2019-10-12: qty 2

## 2019-10-12 MED ORDER — SODIUM CHLORIDE 0.9 % IV SOLN: INTRAVENOUS | Status: AC

## 2019-10-12 MED ORDER — BRIMONIDINE TARTRATE 0.2 % OP SOLN
1.0000 [drp] | Freq: Two times a day (BID) | OPHTHALMIC | Status: DC
  Administered 2019-10-12 – 2019-10-17 (×11): 1 [drp] via OPHTHALMIC
  Filled 2019-10-12: qty 5

## 2019-10-12 MED ORDER — SODIUM CHLORIDE 0.9 % IV SOLN
100.0000 mg | Freq: Every day | INTRAVENOUS | Status: AC
  Administered 2019-10-13 – 2019-10-16 (×4): 100 mg via INTRAVENOUS
  Filled 2019-10-12 (×4): qty 20

## 2019-10-12 MED ORDER — SODIUM CHLORIDE 0.9 % IV SOLN
200.0000 mg | Freq: Once | INTRAVENOUS | Status: AC
  Administered 2019-10-12: 200 mg via INTRAVENOUS
  Filled 2019-10-12: qty 200

## 2019-10-12 MED ORDER — METHYLPREDNISOLONE SODIUM SUCC 40 MG IJ SOLR
40.0000 mg | Freq: Two times a day (BID) | INTRAMUSCULAR | Status: DC
  Administered 2019-10-12 – 2019-10-15 (×8): 40 mg via INTRAVENOUS
  Filled 2019-10-12 (×8): qty 1

## 2019-10-12 NOTE — Progress Notes (Signed)
Spoke with patients daughter this am to update on patient. Questions answered.

## 2019-10-12 NOTE — Plan of Care (Signed)

## 2019-10-12 NOTE — ED Notes (Signed)
[facility] for [facility] 

## 2019-10-12 NOTE — Progress Notes (Signed)
PROGRESS NOTE  Aaron Mendez HQI:[id number] DOB: 1946-08-17 DOA: 10/11/2019 PCP: Everrett Coombe, DO   LOS: 0 days   Brief Narrative / Interim history: 74 year old male with history of OA came into the ER and was admitted to the hospital on 2/2 with increased weakness, fever and chills, as well as nonproductive cough.  He was found to be febrile in the ED, appeared dehydrated, Covid test came back positive and was admitted to the hospital.  Subjective / 24h Interval events: Still complains of feeling poorly this morning, denies any significant shortness of breath  Assessment & Plan:  Principal Problem Acute Hypoxic Respiratory Failure due to Covid-19 Viral Illness -Patient's chest x-ray is clear however he is hypoxic this morning, started on remdesivir, continue for total of 5 days -Given hypoxia and requirement of 2 L nasal cannula will also start on steroids   COVID-19 Labs  Recent Labs    10/11/19 2330 10/12/19 0541  DDIMER 1.39* 1.99*  FERRITIN 2,702*  --   LDH 269*  --   CRP 2.2* 2.2*    Active Problems Essential hypertension -Hydralazine as needed, monitor  History of OA -Stable, monitor  Scheduled Meds: . brimonidine  1 drop Both Eyes BID  . enoxaparin (LOVENOX) injection  40 mg Subcutaneous Daily  . [START ON 10/13/2019] pneumococcal 23 valent vaccine  0.5 mL Intramuscular Tomorrow-1000   Continuous Infusions: . sodium chloride 50 mL/hr at 10/12/19 0615  . [START ON 10/13/2019] remdesivir 100 mg in NS 100 mL     PRN Meds:.acetaminophen **OR** acetaminophen, hydrALAZINE, ondansetron **OR** ondansetron (ZOFRAN) IV  DVT prophylaxis: Lovenox Code Status: Full code Family Communication: Discussed with patient, he stated he will update daughter himself Patient admitted from: Home Anticipated d/c place: Home Barriers to d/c: Hypoxia  Consultants:  None  Procedures:  None   Microbiology: None   Antimicrobials: None    Objective: Vitals:   10/12/19 0620  10/12/19 0635 10/12/19 0724 10/12/19 0800  BP:  (!) 167/125 137/72   Pulse: 65 64 61   Resp: 13 20 18    Temp:  98.8 F (37.1 C) 98.6 F (37 C)   TempSrc:  Oral Oral   SpO2: 95% 93% 97%   Weight:    87.1 kg  Height:    5\' 10"  (1.778 m)    Intake/Output Summary (Last 24 hours) at 10/12/2019 1133 Last data filed at 10/12/2019 0615 Gross per 24 hour  Intake 1298.63 ml  Output --  Net 1298.63 ml   Filed Weights   10/12/19 0800  Weight: 87.1 kg    Examination:  Constitutional: NAD Eyes: no scleral icterus ENMT: Mucous membranes are moist.  Neck: normal, supple Respiratory: clear to auscultation bilaterally, no wheezing, no crackles. Normal respiratory effort. No accessory muscle use.  Cardiovascular: Regular rate and rhythm, no murmurs / rubs / gallops. No LE edema. Abdomen: non distended, no tenderness. Bowel sounds positive.  Musculoskeletal: no clubbing / cyanosis.  Skin: no rashes Neurologic: non focal   Data Reviewed: I have independently reviewed following labs and imaging studies  CBC: Recent Labs  Lab 10/11/19 1536 10/12/19 0541  WBC 6.1 4.9  NEUTROABS 4.1 3.2  HGB 14.2 12.8*  HCT 45.7 41.3  MCV 90.1 91.2  PLT 197 185   Basic Metabolic Panel: Recent Labs  Lab 10/11/19 1536 10/11/19 1924 10/12/19 0541  NA 138  --  140  K 4.1  --  4.0  CL 102  --  107  CO2 24  --  25  GLUCOSE 114*  --  133*  BUN 13  --  13  CREATININE 1.17  --  1.14  CALCIUM 8.2*  --  7.7*  MG  --  2.3  --    GFR: Estimated Creatinine Clearance: 58.7 mL/min (by C-G formula based on SCr of 1.14 mg/dL). Liver Function Tests: Recent Labs  Lab 10/12/19 0541  AST 30  ALT 25  ALKPHOS 31*  BILITOT 1.2  PROT 6.0*  ALBUMIN 2.8*   No results for input(s): LIPASE, AMYLASE in the last 168 hours. No results for input(s): AMMONIA in the last 168 hours. Coagulation Profile: No results for input(s): INR, PROTIME in the last 168 hours. Cardiac Enzymes: Recent Labs  Lab 10/12/19 0541   CKTOTAL 189   BNP (last 3 results) No results for input(s): PROBNP in the last 8760 hours. HbA1C: No results for input(s): HGBA1C in the last 72 hours. CBG: No results for input(s): GLUCAP in the last 168 hours. Lipid Profile: Recent Labs    10/11/19 2330  TRIG 82   Thyroid Function Tests: Recent Labs    10/12/19 0541  TSH 1.663   Anemia Panel: Recent Labs    10/11/19 2330  FERRITIN 2,702*   Urine analysis:    Component Value Date/Time   COLORURINE YELLOW 10/11/2019 1955   APPEARANCEUR CLEAR 10/11/2019 1955   LABSPEC 1.014 10/11/2019 1955   PHURINE 6.0 10/11/2019 1955   GLUCOSEU NEGATIVE 10/11/2019 1955   HGBUR SMALL (A) 10/11/2019 1955   BILIRUBINUR NEGATIVE 10/11/2019 [place] NEGATIVE 10/11/2019 1955   PROTEINUR 100 (A) 10/11/2019 1955   NITRITE NEGATIVE 10/11/2019 1955   LEUKOCYTESUR NEGATIVE 10/11/2019 1955   Sepsis Labs: Invalid input(s): PROCALCITONIN, LACTICIDVEN  No results found for this or any previous visit (from the past 240 hour(s)).    Radiology Studies: DG Chest Port 1 View  Result Date: 10/11/2019 CLINICAL DATA:  Loss of appetite for 1 week with fatigue. Diagnosed with COVID-19 infection yesterday. EXAM: PORTABLE CHEST 1 VIEW COMPARISON:  Radiographs 07/26/2015. FINDINGS: 1353 hours. The heart size and mediastinal contours are normal. The lungs are clear. There is no pleural effusion or pneumothorax. No acute osseous findings are identified. IMPRESSION: No active cardiopulmonary process. Electronically Signed   By: Richardean Sale M.D.   On: 10/11/2019 14:21   Marzetta Board, MD, PhD [facility]  Between 7 am - 7 pm I am available, please contact me via Amion or Securechat  Between 7 pm - 7 am I am not available, please contact night coverage MD/APP via Amion

## 2019-10-12 NOTE — ED Notes (Signed)
[facility] notified that pt is currently being picked up by [facility] and will be on the way shortly

## 2019-10-12 NOTE — Progress Notes (Signed)
Uneventful shift. No falls or injuries this shift. No cardiac or respiratory issues this shift. Continues to feel weak but is now afebrile. No complaints at this time. Call bell within reach. Will continue to monitor.

## 2019-10-12 NOTE — H&P (Signed)
History and Physical    Aaron Mendez WPY:[id number] DOB: 07-21-1946 DOA: 10/11/2019  PCP: Luetta Nutting, DO  Patient coming from: Home.  Chief Complaint: Weakness.  HPI: Aaron Mendez is a 74 y.o. male with history of osteoarthritis presents to the ER with complaints of increasing weakness over the last 1 week.  Has been having subjective feeling of fever chills nonproductive cough.  Denies any nausea vomiting diarrhea chest pain.  Denies any recent travel or sick contacts.  ED Course: In the ER patient had a fever 102.4 F appears dehydrated.  Blood cultures were obtained along with UA urine cultures and chest x-ray was showing no infiltrates UA is unremarkable.  Covid test came back positive.  Patient's labs show CBC was unremarkable creatinine 1.17 ferritin was 2700 CRP 2.2 procalcitonin less than 0.10.  Patient was given 1 L fluid bolus admitted for dehydration weakness secondary to COVID-19 infection with fever and started on IV remdesivir.  Review of Systems: As per HPI, rest all negative.   Past Medical History:  Diagnosis Date  . Bilateral primary osteoarthritis of knee   . Sickle cell trait ([place])     History reviewed. No pertinent surgical history.   reports that he has never smoked. He has never used smokeless tobacco. He reports that he does not drink alcohol or use drugs.  No Known Allergies  Family History  Problem Relation Age of Onset  . Sickle cell anemia Son     Prior to Admission medications   Medication Sig Start Date End Date Taking? Authorizing Provider  atorvastatin (LIPITOR) 20 MG tablet TAKE 1 TABLET(20 MG) BY MOUTH DAILY Patient taking differently: Take 20 mg by mouth daily.  08/19/19   Luetta Nutting, DO  brimonidine (ALPHAGAN) 0.2 % ophthalmic solution 1 drop 2 (two) times daily. 08/22/19   [provider]  ibuprofen (ADVIL) 800 MG tablet  07/07/19   [provider]  naproxen (NAPROSYN) 500 MG tablet TAKE 1 TABLET(500 MG) BY MOUTH  TWICE DAILY WITH A MEAL 10/04/19   Luetta Nutting, DO    Physical Exam: Constitutional: Moderately built and nourished. Vitals:   10/11/19 2245 10/11/19 2300 10/12/19 0130 10/12/19 0200  BP: 109/76 111/73 123/67 135/71  Pulse: 62 63  (!) 56  Resp: 13 (!) 21 19 19   Temp:   98.8 F (37.1 C)   TempSrc:   Oral   SpO2: 94% 92% 94% 95%   Eyes: Anicteric no pallor. ENMT: No discharge from the ears eyes nose or mouth. Neck: No mass felt.  No neck rigidity. Respiratory: No rhonchi or crepitations. Cardiovascular: S1-S2 heard. Abdomen: Soft nontender bowel sounds present. Musculoskeletal: No edema.  No joint effusion. Skin: No rash. Neurologic: Alert awake oriented to time place and person.  Moves all extremities. Psychiatric: Appears normal with normal affect.   Labs on Admission: I have personally reviewed following labs and imaging studies  CBC: Recent Labs  Lab 10/11/19 1536  WBC 6.1  NEUTROABS 4.1  HGB 14.2  HCT 45.7  MCV 90.1  PLT 053   Basic Metabolic Panel: Recent Labs  Lab 10/11/19 1536 10/11/19 1924  NA 138  --   K 4.1  --   CL 102  --   CO2 24  --   GLUCOSE 114*  --   BUN 13  --   CREATININE 1.17  --   CALCIUM 8.2*  --   MG  --  2.3   GFR: Estimated Creatinine Clearance: 57.2 mL/min (by C-G formula  based on SCr of 1.17 mg/dL). Liver Function Tests: No results for input(s): AST, ALT, ALKPHOS, BILITOT, PROT, ALBUMIN in the last 168 hours. No results for input(s): LIPASE, AMYLASE in the last 168 hours. No results for input(s): AMMONIA in the last 168 hours. Coagulation Profile: No results for input(s): INR, PROTIME in the last 168 hours. Cardiac Enzymes: No results for input(s): CKTOTAL, CKMB, CKMBINDEX, TROPONINI in the last 168 hours. BNP (last 3 results) No results for input(s): PROBNP in the last 8760 hours. HbA1C: No results for input(s): HGBA1C in the last 72 hours. CBG: No results for input(s): GLUCAP in the last 168 hours. Lipid Profile:  Recent Labs    10/11/19 2330  TRIG 82   Thyroid Function Tests: No results for input(s): TSH, T4TOTAL, FREET4, T3FREE, THYROIDAB in the last 72 hours. Anemia Panel: Recent Labs    10/11/19 2330  FERRITIN 2,702*   Urine analysis:    Component Value Date/Time   COLORURINE YELLOW 10/11/2019 1955   APPEARANCEUR CLEAR 10/11/2019 1955   LABSPEC 1.014 10/11/2019 1955   PHURINE 6.0 10/11/2019 1955   GLUCOSEU NEGATIVE 10/11/2019 1955   HGBUR SMALL (A) 10/11/2019 1955   BILIRUBINUR NEGATIVE 10/11/2019 1955   KETONESUR NEGATIVE 10/11/2019 1955   PROTEINUR 100 (A) 10/11/2019 1955   NITRITE NEGATIVE 10/11/2019 1955   LEUKOCYTESUR NEGATIVE 10/11/2019 1955   Sepsis Labs: @LABRCNTIP (procalcitonin:4,lacticidven:4) )No results found for this or any previous visit (from the past 240 hour(s)).   Radiological Exams on Admission: DG Chest Port 1 View  Result Date: 10/11/2019 CLINICAL DATA:  Loss of appetite for 1 week with fatigue. Diagnosed with COVID-19 infection yesterday. EXAM: PORTABLE CHEST 1 VIEW COMPARISON:  Radiographs 07/26/2015. FINDINGS: 1353 hours. The heart size and mediastinal contours are normal. The lungs are clear. There is no pleural effusion or pneumothorax. No acute osseous findings are identified. IMPRESSION: No active cardiopulmonary process. Electronically Signed   By: 07/28/2015 M.D.   On: 10/11/2019 14:21    EKG: Independently reviewed.  Normal sinus rhythm.  Assessment/Plan Principal Problem:   Fever due to COVID-19 Active Problems:   Dehydration    1. Fever secondary to COVID-19 infection with dehydration for which patient received fluids and I have placed patient on IV remdesivir.  Closely monitor.  Patient presently not hypoxic so not placed on Decadron.  Follow respiratory status and include markers. 2. Elevated blood pressure readings -as needed IV hydralazine follow closely follow blood pressure trends. 3. History of osteoarthritis was recently on  NSAIDs.  Home medication has to be reconciled. Given the Covid infection with general weakness and fever will admit as inpatient.   DVT prophylaxis: Lovenox. Code Status: Full code. Family Communication: Discussed with patient. Disposition Plan: Home. Consults called: None. Admission status: Inpatient.   12/09/2019 MD [facility] Pager [phone].  If 7PM-7AM, please contact night-coverage [email or web address] Password [email or web address]  10/12/2019, 3:13 AM

## 2019-10-13 DIAGNOSIS — R531 Weakness: Secondary | ICD-10-CM

## 2019-10-13 DIAGNOSIS — J9601 Acute respiratory failure with hypoxia: Secondary | ICD-10-CM

## 2019-10-13 LAB — COMPREHENSIVE METABOLIC PANEL
ALT: 28 U/L (ref 0–44)
AST: 28 U/L (ref 15–41)
Albumin: 2.9 g/dL — ABNORMAL LOW (ref 3.5–5.0)
Alkaline Phosphatase: 30 U/L — ABNORMAL LOW (ref 38–126)
Anion gap: 8 (ref 5–15)
BUN: 18 mg/dL (ref 8–23)
CO2: 21 mmol/L — ABNORMAL LOW (ref 22–32)
Calcium: 7.9 mg/dL — ABNORMAL LOW (ref 8.9–10.3)
Chloride: 108 mmol/L (ref 98–111)
Creatinine, Ser: 1.05 mg/dL (ref 0.61–1.24)
GFR calc Af Amer: >60 mL/min (ref >60)
GFR calc non Af Amer: >60 mL/min (ref >60)
Glucose, Bld: 137 mg/dL — ABNORMAL HIGH (ref 70–99)
Potassium: 4.4 mmol/L (ref 3.5–5.1)
Sodium: 137 mmol/L (ref 135–145)
Total Bilirubin: 1 mg/dL (ref 0.3–1.2)
Total Protein: 6.3 g/dL — ABNORMAL LOW (ref 6.5–8.1)

## 2019-10-13 LAB — CBC
HCT: 36.4 % — ABNORMAL LOW (ref 39.0–52.0)
Hemoglobin: 11.2 g/dL — ABNORMAL LOW (ref 13.0–17.0)
MCH: 27.7 pg (ref 26.0–34.0)
MCHC: 30.8 g/dL (ref 30.0–36.0)
MCV: 89.9 fL (ref 80.0–100.0)
Platelets: 219 10*3/uL (ref 150–400)
RBC: 4.05 MIL/uL — ABNORMAL LOW (ref 4.22–5.81)
RDW: 12.1 % (ref 11.5–15.5)
WBC: 4.6 10*3/uL (ref 4.0–10.5)
nRBC: 0 % (ref 0.0–0.2)

## 2019-10-13 LAB — D-DIMER, QUANTITATIVE: D-Dimer, Quant: 1.7 ug/mL-FEU — ABNORMAL HIGH (ref 0.00–0.50)

## 2019-10-13 LAB — MAGNESIUM: Magnesium: 2 mg/dL (ref 1.7–2.4)

## 2019-10-13 LAB — C-REACTIVE PROTEIN: CRP: 3 mg/dL — ABNORMAL HIGH (ref <1.0)

## 2019-10-13 LAB — PHOSPHORUS: Phosphorus: 3.3 mg/dL (ref 2.5–4.6)

## 2019-10-13 MED ORDER — PNEUMOCOCCAL VAC POLYVALENT 25 MCG/0.5ML IJ INJ
0.5000 mL | INJECTION | INTRAMUSCULAR | Status: DC | PRN
  Filled 2019-10-13: qty 0.5

## 2019-10-13 NOTE — Progress Notes (Signed)
Occupational Therapy Evaluation Patient Details Name: Aaron Mendez MRN: [id number] DOB: March 30, 1946 Today's Date: 10/13/2019    History of Present Illness 74 year old male with history of OA came into the ER and was admitted to the hospital on 2/2 with increased weakness, fever and chills, as well as nonproductive cough.  He was found to be febrile in the ED, appeared dehydrated, Covid test came back positive and was admitted to the hospital.   Clinical Impression   Patient was pleasant to work with and enjoys making jokes.  He lives in a house where he rents a private room/bathroom.  He has no relation to the others in the home and they are not available to assist him.  During session he was on room air and maintained SpO2 > 90.  Patient able to complete functional mobility and transfers with min guard and rolling walker.  He stood for 12 min at sink to complete grooming.  Patient experienced difficulty with short term recall, problem solving, and more difficult cognitive questions.  Daughter has stated this has been his current norm, which poses a safety concern for returning home independently.  Will continue to follow with OT acutely to address the deficits below.    Follow Up Recommendations  Home health OT;Supervision - Intermittent    Equipment Recommendations  3 in 1 bedside commode    Recommendations for Other Services       Precautions / Restrictions Precautions Precautions: Fall Restrictions Weight Bearing Restrictions: No      Mobility Bed Mobility Overal bed mobility: Modified Independent                Transfers Overall transfer level: Needs assistance Equipment used: Rolling walker (2 wheeled) Transfers: Sit to/from Stand Sit to Stand: Min guard              Balance Overall balance assessment: Mild deficits observed, not formally tested                                         ADL either performed or assessed with clinical judgement    ADL Overall ADL's : Needs assistance/impaired Eating/Feeding: Independent;Sitting   Grooming: Wash/dry hands;Wash/dry face;Oral care;Min guard;Standing   Upper Body Bathing: Set up;Sitting   Lower Body Bathing: Min guard;Sit to/from stand   Upper Body Dressing : Set up;Sitting   Lower Body Dressing: Min guard;Sit to/from stand   Toilet Transfer: Min guard;Ambulation   Toileting- Clothing Manipulation and Hygiene: Min guard;Sit to/from stand       Functional mobility during ADLs: Hydrographic surveyor     Praxis      Pertinent Vitals/Pain Pain Assessment: No/denies pain     Hand Dominance Right   Extremity/Trunk Assessment Upper Extremity Assessment Upper Extremity Assessment: Generalized weakness           Communication Communication Communication: No difficulties   Cognition Arousal/Alertness: Awake/alert Behavior During Therapy: WFL for tasks assessed/performed Overall Cognitive Status: History of cognitive impairments - at baseline                                 General Comments: Some cognitive impairments with memory/problem solving. Daughter said this has been happening recently.   General Comments  Exercises     Shoulder Instructions      Home Living Family/patient expects to be discharged to:: Private residence Living Arrangements: Other (Comment)(Renting a room from non-family/friends)   Type of Home: House Home Access: Stairs to enter [employer] of Steps: 3 Entrance Stairs-Rails: None Home Layout: One level     Bathroom Shower/Tub: Tub/shower unit         Home Equipment: None          Prior Functioning/Environment Level of Independence: Independent        Comments: Driving and working sometimes        OT Problem List: Decreased strength;Decreased activity tolerance;Impaired balance (sitting and/or standing);Decreased cognition      OT  Treatment/Interventions: Self-care/ADL training;Therapeutic exercise;Energy conservation;Therapeutic activities;Cognitive remediation/compensation;Balance training;Patient/family education    OT Goals(Current goals can be found in the care plan section) Acute Rehab OT Goals Patient Stated Goal: Get stronger OT Goal Formulation: With patient Time For Goal Achievement: 10/27/19 Potential to Achieve Goals: Good  OT Frequency: Min 3X/week   Barriers to D/C: Decreased caregiver support  Living situation is not ideal       Co-evaluation              AM-PAC OT "6 Clicks" Daily Activity     Outcome Measure Help from another person eating meals?: None Help from another person taking care of personal grooming?: A Little Help from another person toileting, which includes using toliet, bedpan, or urinal?: A Little Help from another person bathing (including washing, rinsing, drying)?: A Little Help from another person to put on and taking off regular upper body clothing?: A Little Help from another person to put on and taking off regular lower body clothing?: A Little 6 Click Score: 19   End of Session Equipment Utilized During Treatment: Gait belt;Rolling walker Nurse Communication: Mobility status  Activity Tolerance: Patient tolerated treatment well Patient left: Other (comment)(In wheelchair with nursing getting ready for room transfer)  OT Visit Diagnosis: Unsteadiness on feet (R26.81);Muscle weakness (generalized) (M62.81);Other symptoms and signs involving cognitive function                Time: 7209-4709 OT Time Calculation (min): 44 min Charges:  OT General Charges $OT Visit: 1 Visit OT Evaluation $OT Eval Moderate Complexity: 1 Mod OT Treatments $Self Care/Home Management : 23-37 mins  August Luz, OTR/L   Phylliss Bob 10/13/2019, 2:41 PM

## 2019-10-13 NOTE — Progress Notes (Signed)
Pt stable with no signs of distress. Report given to oncoming nurse. Safety maintained.  

## 2019-10-13 NOTE — Progress Notes (Signed)
Daughter called she is updated all questions answered.

## 2019-10-13 NOTE — Progress Notes (Signed)
PROGRESS NOTE  Aaron Mendez AJO:[id number] DOB: Dec 30, 1945 DOA: 10/11/2019 PCP: Luetta Nutting, DO   LOS: 1 day   Brief Narrative / Interim history: 74 year old male with history of OA came into the ER and was admitted to the hospital on 2/2 with increased weakness, fever and chills, as well as nonproductive cough.  He was found to be febrile in the ED, appeared dehydrated, Covid test came back positive and was admitted to the hospital.  Subjective / 24h Interval events: Denies any shortness of breath, no chest pain. He feels quite weak.  Assessment & Plan:  Principal Problem Acute Hypoxic Respiratory Failure due to Covid-19 Viral Illness -Hypoxia seems to have resolved, he was started on remdesivir and continue for a total of 5 days -Given hypoxia and requirement of 2 L nasal cannula he was also started on steroids, currently on room air -Seems to be overall improving, responding to treatment, closely monitor -Physical therapy ordered today given weakness   COVID-19 Labs  Recent Labs    10/11/19 2330 10/12/19 0541 10/13/19 0300  DDIMER 1.39* 1.99* 1.70*  FERRITIN 2,702*  --   --   LDH 269*  --   --   CRP 2.2* 2.2* 3.0*    Active Problems Essential hypertension -Hydralazine as needed, monitor  History of OA -Stable, monitor  Scheduled Meds: . brimonidine  1 drop Both Eyes BID  . enoxaparin (LOVENOX) injection  40 mg Subcutaneous Daily  . methylPREDNISolone (SOLU-MEDROL) injection  40 mg Intravenous Q12H   Continuous Infusions: . remdesivir 100 mg in NS 100 mL 100 mg (10/13/19 0859)   PRN Meds:.acetaminophen **OR** acetaminophen, hydrALAZINE, ondansetron **OR** ondansetron (ZOFRAN) IV, [START ON 10/16/2019] pneumococcal 23 valent vaccine  DVT prophylaxis: Lovenox Code Status: Full code Family Communication: Discussed with patient, he stated he will update daughter himself Patient admitted from: Home Anticipated d/c place: Home Barriers to d/c: Hypoxia   Consultants:  None  Procedures:  None   Microbiology: None   Antimicrobials: None    Objective: Vitals:   10/13/19 0401 10/13/19 0402 10/13/19 0459 10/13/19 0736  BP:    127/62  Pulse:   (!) 46 (!) 54  Resp: 19 19 18 16   Temp:    98 F (36.7 C)  TempSrc:    Oral  SpO2:   95% 91%  Weight:      Height:        Intake/Output Summary (Last 24 hours) at 10/13/2019 1045 Last data filed at 10/13/2019 1009 Gross per 24 hour  Intake 2340.48 ml  Output 2400 ml  Net -59.52 ml   Filed Weights   10/12/19 0800  Weight: 87.1 kg    Examination:  Constitutional: No distress Eyes: No scleral icterus ENMT: Moist external drains Neck: normal, supple Respiratory: Clear bilaterally without wheezing or crackles, normal respiratory effort Cardiovascular: Regular rate and rhythm, no murmurs, no edema Abdomen: Soft, NT, ND, bowel sounds positive Musculoskeletal: no clubbing / cyanosis.  Skin: No rashes seen Neurologic: No focal deficits  Data Reviewed: I have independently reviewed following labs and imaging studies  CBC: Recent Labs  Lab 10/11/19 1536 10/12/19 0541 10/13/19 0300  WBC 6.1 4.9 4.6  NEUTROABS 4.1 3.2  --   HGB 14.2 12.8* 11.2*  HCT 45.7 41.3 36.4*  MCV 90.1 91.2 89.9  PLT 197 185 947   Basic Metabolic Panel: Recent Labs  Lab 10/11/19 1536 10/11/19 1924 10/12/19 0541 10/13/19 0300  NA 138  --  140 137  K 4.1  --  4.0 4.4  CL 102  --  107 108  CO2 24  --  25 21*  GLUCOSE 114*  --  133* 137*  BUN 13  --  13 18  CREATININE 1.17  --  1.14 1.05  CALCIUM 8.2*  --  7.7* 7.9*  MG  --  2.3  --  2.0  PHOS  --   --   --  3.3   GFR: Estimated Creatinine Clearance: 63.7 mL/min (by C-G formula based on SCr of 1.05 mg/dL). Liver Function Tests: Recent Labs  Lab 10/12/19 0541 10/13/19 0300  AST 30 28  ALT 25 28  ALKPHOS 31* 30*  BILITOT 1.2 1.0  PROT 6.0* 6.3*  ALBUMIN 2.8* 2.9*   No results for input(s): LIPASE, AMYLASE in the last 168 hours. No  results for input(s): AMMONIA in the last 168 hours. Coagulation Profile: No results for input(s): INR, PROTIME in the last 168 hours. Cardiac Enzymes: Recent Labs  Lab 10/12/19 0541  CKTOTAL 189   BNP (last 3 results) No results for input(s): PROBNP in the last 8760 hours. HbA1C: No results for input(s): HGBA1C in the last 72 hours. CBG: No results for input(s): GLUCAP in the last 168 hours. Lipid Profile: Recent Labs    10/11/19 2330  TRIG 82   Thyroid Function Tests: Recent Labs    10/12/19 0541  TSH 1.663   Anemia Panel: Recent Labs    10/11/19 2330  FERRITIN 2,702*   Urine analysis:    Component Value Date/Time   COLORURINE YELLOW 10/11/2019 1955   APPEARANCEUR CLEAR 10/11/2019 1955   LABSPEC 1.014 10/11/2019 1955   PHURINE 6.0 10/11/2019 1955   GLUCOSEU NEGATIVE 10/11/2019 1955   HGBUR SMALL (A) 10/11/2019 1955   BILIRUBINUR NEGATIVE 10/11/2019 1955   KETONESUR NEGATIVE 10/11/2019 1955   PROTEINUR 100 (A) 10/11/2019 1955   NITRITE NEGATIVE 10/11/2019 1955   LEUKOCYTESUR NEGATIVE 10/11/2019 1955   Sepsis Labs: Invalid input(s): PROCALCITONIN, LACTICIDVEN  Recent Results (from the past 240 hour(s))  Urine culture     Status: None   Collection Time: 10/11/19  7:56 PM   Specimen: Urine, Catheterized  Result Value Ref Range Status   Specimen Description URINE, CATHETERIZED  Final   Special Requests NONE  Final   Culture   Final    NO GROWTH Performed at [facility], [address] [place], [place] [address]    Report Status 10/12/2019 FINAL  Final      Radiology Studies: DG Chest Port 1 View  Result Date: 10/11/2019 CLINICAL DATA:  Loss of appetite for 1 week with fatigue. Diagnosed with COVID-19 infection yesterday. EXAM: PORTABLE CHEST 1 VIEW COMPARISON:  Radiographs 07/26/2015. FINDINGS: 1353 hours. The heart size and mediastinal contours are normal. The lungs are clear. There is no pleural effusion or pneumothorax. No acute osseous  findings are identified. IMPRESSION: No active cardiopulmonary process. Electronically Signed   By: Carey Bullocks M.D.   On: 10/11/2019 14:21   Pamella Pert, MD, PhD [facility]  Between 7 am - 7 pm I am available, please contact me via Amion or Securechat  Between 7 pm - 7 am I am not available, please contact night coverage MD/APP via Amion

## 2019-10-14 LAB — COMPREHENSIVE METABOLIC PANEL
ALT: 34 U/L (ref 0–44)
AST: 30 U/L (ref 15–41)
Albumin: 3 g/dL — ABNORMAL LOW (ref 3.5–5.0)
Alkaline Phosphatase: 37 U/L — ABNORMAL LOW (ref 38–126)
Anion gap: 7 (ref 5–15)
BUN: 20 mg/dL (ref 8–23)
CO2: 21 mmol/L — ABNORMAL LOW (ref 22–32)
Calcium: 8.5 mg/dL — ABNORMAL LOW (ref 8.9–10.3)
Chloride: 110 mmol/L (ref 98–111)
Creatinine, Ser: 0.97 mg/dL (ref 0.61–1.24)
GFR calc Af Amer: >60 mL/min (ref >60)
GFR calc non Af Amer: >60 mL/min (ref >60)
Glucose, Bld: 262 mg/dL — ABNORMAL HIGH (ref 70–99)
Potassium: 4.7 mmol/L (ref 3.5–5.1)
Sodium: 138 mmol/L (ref 135–145)
Total Bilirubin: 0.8 mg/dL (ref 0.3–1.2)
Total Protein: 6.3 g/dL — ABNORMAL LOW (ref 6.5–8.1)

## 2019-10-14 LAB — CBC
HCT: 37.4 % — ABNORMAL LOW (ref 39.0–52.0)
Hemoglobin: 11.8 g/dL — ABNORMAL LOW (ref 13.0–17.0)
MCH: 28.2 pg (ref 26.0–34.0)
MCHC: 31.6 g/dL (ref 30.0–36.0)
MCV: 89.3 fL (ref 80.0–100.0)
Platelets: 285 10*3/uL (ref 150–400)
RBC: 4.19 MIL/uL — ABNORMAL LOW (ref 4.22–5.81)
RDW: 12.1 % (ref 11.5–15.5)
WBC: 10.2 10*3/uL (ref 4.0–10.5)
nRBC: 0 % (ref 0.0–0.2)

## 2019-10-14 LAB — D-DIMER, QUANTITATIVE: D-Dimer, Quant: 1.38 ug/mL-FEU — ABNORMAL HIGH (ref 0.00–0.50)

## 2019-10-14 LAB — C-REACTIVE PROTEIN: CRP: 1.2 mg/dL — ABNORMAL HIGH (ref <1.0)

## 2019-10-14 LAB — MAGNESIUM: Magnesium: 2 mg/dL (ref 1.7–2.4)

## 2019-10-14 NOTE — TOC Progression Note (Addendum)
Transition of Care [facility]) - Progression Note    Patient Details  Name: Aaron Mendez MRN: [id number] Date of Birth: 1945/11/12  Transition of Care [facility]) CM/SW Contact  Durenda Guthrie, RN Phone Number: [phone] (working remotely) 10/14/2019, 1:26 PM  Clinical Narrative:  Case manager spoke with patient via telephone concerning discharge needs. Patient's daughter-Aaron Mendez has stated that she and her husband are just recovering from COVID and can not have patient at their home. Patient states he is renting a room and bathroom in a home and that the lady that owns the home will continue to assist him. Patient is not interested at all in going to SNF for rehab. States he knows he is weak but with therapy he will get better. Case manager discussed choice for Home Health agencies, patient has no preference. Referral called to [facility], will request RW and 3in1 from [employer] closet, be delivered to his room by [facility]ne. TOC Team will continue to monitor. 1345: Case manager spoke with patient's daughter Aaron Mendez concerning her dad's decision to go home. She is not pleased and says he needs to go to facility. CM explained that her dad is of sound mind and able to make his own decisions, and therapy has assessed patient. Aaron Mendez asked if we could put him up in a hotel until he recovers, CM explained that we can not but she certainly can if he is agreeable to do so. Aaron Mendez states that she will let him do what he wants. CM and MD have discussed these conversations.   Expected Discharge Plan: Home w Home Health Services Barriers to Discharge: Continued Medical Work up  Expected Discharge Plan and Services Expected Discharge Plan: Home w Home Health Services   Discharge Planning Services: CM Consult Post Acute Care Choice: Home Health, Durable Medical Equipment Living arrangements for the past 2 months: Boarding House                           HH Arranged: PT, OT HH Agency: [facility]  Health Care Date [facility] Agency Contacted: 10/14/19 Time HH Agency Contacted: 1323 Representative spoke with at [facility] Agency: Lorenza Chick   Social Determinants of Health (SDOH) Interventions    Readmission Risk Interventions No flowsheet data found.

## 2019-10-14 NOTE — Evaluation (Signed)
Physical Therapy Evaluation & Discharge Patient Details Name: Aaron Mendez MRN: [id number] DOB: 01-17-1946 Today's Date: 10/14/2019   History of Present Illness  Pt is a 74 y.o. male admitted 10/11/19 with weakness, fever, chills and cough; tested (+) COVID-19. Worked up for acute hypoxic respiratory failure secondary to COVID-19. PMH includes OA.    Clinical Impression  Patient evaluated by Physical Therapy with no further acute PT needs identified. PTA, pt independent, drives, works and lives with roommates. Today, pt independent with mobility and ADL tasks. Reports feeling more stable with use of RW. Educ re: activity recommendations, energy conservation and importance of mobility. All education has been completed and the patient has no further questions. Acute PT is signing off. Thank you for this referral.    Follow Up Recommendations Home health PT;Supervision - Intermittent    Equipment Recommendations  Rolling walker with 5" wheels    Recommendations for Other Services       Precautions / Restrictions Restrictions Weight Bearing Restrictions: No      Mobility  Bed Mobility Overal bed mobility: Independent                Transfers Overall transfer level: Independent Equipment used: None                Ambulation/Gait Ambulation/Gait assistance: Independent Gait Distance (Feet): 560 Feet Assistive device: None Gait Pattern/deviations: Step-through pattern;Decreased stride length   Gait velocity interpretation: 1.31 - 2.62 ft/sec, indicative of limited community ambulator General Gait Details: Slow, steady gait independent without DME; gait speed improving with increased distance. Pt reports feeling more stable with RW; encouraged use of this when ambulating independently in hallway  Stairs            Wheelchair Mobility    Modified Rankin (Stroke Patients Only)       Balance Overall balance assessment: Mild deficits observed, not formally  tested                                           Pertinent Vitals/Pain Pain Assessment: No/denies pain    Home Living Family/patient expects to be discharged to:: Private residence Living Arrangements: Non-relatives/Friends Available Help at Discharge: Family;Friend(s);Available PRN/intermittently Type of Home: House Home Access: Stairs to enter Entrance Stairs-Rails: None Entrance Stairs-Number of Steps: 3 Home Layout: One level Home Equipment: None Additional Comments: Lives with roommates who are not available to assist    Prior Function Level of Independence: Independent         Comments: Driving and working sometimes     Hand Dominance   Dominant Hand: Right    Extremity/Trunk Assessment   Upper Extremity Assessment Upper Extremity Assessment: Generalized weakness    Lower Extremity Assessment Lower Extremity Assessment: Generalized weakness       Communication   Communication: No difficulties  Cognition Arousal/Alertness: Awake/alert Behavior During Therapy: WFL for tasks assessed/performed Overall Cognitive Status: No family/caregiver present to determine baseline cognitive functioning                                 General Comments: WFL for simple tasks; some decreased/slowed problem solving noted, potentially baseline      General Comments General comments (skin integrity, edema, etc.): Discussed post-acute rehab vs. HH services, pt reports "I'm going home" and is agreeable to HHPT  Exercises     Assessment/Plan    PT Assessment All further PT needs can be met in the next venue of care  PT Problem List Decreased strength;Decreased activity tolerance;Decreased balance;Decreased mobility;Cardiopulmonary status limiting activity       PT Treatment Interventions      PT Goals (Current goals can be found in the Care Plan section)  Acute Rehab PT Goals Patient Stated Goal: Return home PT Goal Formulation: All  assessment and education complete, DC therapy    Frequency     Barriers to discharge        Co-evaluation               AM-PAC PT "6 Clicks" Mobility  Outcome Measure Help needed turning from your back to your side while in a flat bed without using bedrails?: None Help needed moving from lying on your back to sitting on the side of a flat bed without using bedrails?: None Help needed moving to and from a bed to a chair (including a wheelchair)?: None Help needed standing up from a chair using your arms (e.g., wheelchair or bedside chair)?: None Help needed to walk in hospital room?: None Help needed climbing 3-5 steps with a railing? : None 6 Click Score: 24    End of Session   Activity Tolerance: Patient tolerated treatment well Patient left: in bed;with call bell/phone within reach Nurse Communication: Mobility status PT Visit Diagnosis: Other abnormalities of gait and mobility (R26.89)    Time: 3716-9678 PT Time Calculation (min) (ACUTE ONLY): 20 min   Charges:   PT Evaluation $PT Eval Moderate Complexity: [place], PT, DPT Acute Rehabilitation Services  Pager [phone] Office [place] 10/14/2019, 1:31 PM

## 2019-10-14 NOTE — Progress Notes (Signed)
Called daughter [name], to provide updates. She voiced her concern about her father being discharged too early and feels that he is not strong enough to go home yet due to a living situation. Recommended to her to call and speak to case mgmt or SS to address these concerns before discharge. She states that he doesn't have anyone to help him at home and that he is living in a basement of unknown persons home. Assured her that we will keep her updated on discharge planning.

## 2019-10-14 NOTE — Progress Notes (Addendum)
Occupational Therapy Treatment Patient Details Name: Aaron Mendez MRN: [id number] DOB: March 05, 1946 Today's Date: 10/14/2019    History of present illness Pt is a 74 y.o. male admitted 10/11/19 with weakness, fever, chills and cough; tested (+) COVID-19. Worked up for acute hypoxic respiratory failure secondary to COVID-19. PMH includes OA.   OT comments  Patient in bed on arrival, he was very willing to participate in therapy.  Patient remembered therapist from yesterday's visit.  Completed the [facility] Cognitive assessment with patient and he scored a 7, indicating questionable impairment.  Patient was aware and a little embarrassed if he could not answer the question correctly.  Recommend follow up with outpatient MD to further evaluate for early dementing disorder.  Patient was able to independently perform functional mobility and walk 129ft with no signs of exertion.  Will continue to follow acutely with OT for cognitive strategies and improvement of strength to ensure safe discharge.   Follow Up Recommendations  Home health OT;Supervision - Intermittent    Equipment Recommendations  3 in 1 bedside commode    Recommendations for Other Services      Precautions / Restrictions Restrictions Weight Bearing Restrictions: No       Mobility Bed Mobility Overal bed mobility: Independent                Transfers Overall transfer level: Independent Equipment used: None                  Balance Overall balance assessment: Mild deficits observed, not formally tested                                         ADL either performed or assessed with clinical judgement   ADL       Grooming: Independent               Lower Body Dressing: Independent                 General ADL Comments: Requires some verbal reminedrs     Vision       Perception     Praxis      Cognition Arousal/Alertness: Awake/alert Behavior During Therapy: WFL  for tasks assessed/performed Overall Cognitive Status: No family/caregiver present to determine baseline cognitive functioning                                 General Comments: Completed short-blessed assessment during session.  Patient scored a 7, indicating questionable impairment.        Exercises Exercises: Other exercises Other Exercises Other Exercises: Walked 100 ft   Shoulder Instructions       General Comments Discussed post-acute rehab vs. HH services, pt reports "I'm going home" and is agreeable to HHPT    Pertinent Vitals/ Pain       Pain Assessment: No/denies pain  Home Living Family/patient expects to be discharged to:: Private residence Living Arrangements: Non-relatives/Friends Available Help at Discharge: Family;Friend(s);Available PRN/intermittently Type of Home: House Home Access: Stairs to enter [employer] of Steps: 3 Entrance Stairs-Rails: None Home Layout: One level     Bathroom Shower/Tub: Tub/shower unit         Home Equipment: None   Additional Comments: Lives with roommates who are not available to assist      Prior Functioning/Environment Level of Independence:  Independent        Comments: Driving and working sometimes   Frequency  Min 3X/week        Progress Toward Goals  OT Goals(current goals can now be found in the care plan section)  Progress towards OT goals: Progressing toward goals  Acute Rehab OT Goals Patient Stated Goal: Return home OT Goal Formulation: With patient Time For Goal Achievement: 10/27/19 Potential to Achieve Goals: Good  Plan Discharge plan remains appropriate    Co-evaluation                 AM-PAC OT "6 Clicks" Daily Activity     Outcome Measure   Help from another person eating meals?: None Help from another person taking care of personal grooming?: None Help from another person toileting, which includes using toliet, bedpan, or urinal?: None Help from  another person bathing (including washing, rinsing, drying)?: None Help from another person to put on and taking off regular upper body clothing?: None Help from another person to put on and taking off regular lower body clothing?: None 6 Click Score: 24    End of Session    OT Visit Diagnosis: Unsteadiness on feet (R26.81);Muscle weakness (generalized) (M62.81);Other symptoms and signs involving cognitive function   Activity Tolerance Patient tolerated treatment well   Patient Left in bed;with call bell/phone within reach   Nurse Communication Mobility status        Time: 6712-4580 OT Time Calculation (min): 23 min  Charges: OT General Charges $OT Visit: 1 Visit OT Treatments $Self Care/Home Management : 8-22 mins $Therapeutic Activity: 8-22 mins  August Luz, OTR/L    Phylliss Bob 10/14/2019, 4:12 PM

## 2019-10-14 NOTE — Progress Notes (Signed)
PROGRESS NOTE  Aaron Mendez SWF:[id number] DOB: 12/30/1945 DOA: 10/11/2019 PCP: Everrett Coombe, DO   LOS: 2 days   Brief Narrative / Interim history: 74 year old male with history of OA came into the ER and was admitted to the hospital on 2/2 with increased weakness, fever and chills, as well as nonproductive cough.  He was found to be febrile in the ED, appeared dehydrated, Covid test came back positive and was admitted to the hospital.  Subjective / 24h Interval events: No chest pain, no shortness of breath.  Feels a little bit stronger but has not walked much  Assessment & Plan:  Principal Problem Acute Hypoxic Respiratory Failure due to Covid-19 Viral Illness -Hypoxia seems to have resolved, he was started on remdesivir and continue for a total of 5 days, Sunday will be last dose -Given hypoxia and requirement of 2 L nasal cannula he was also started on steroids, currently on room air.  Continue -Seems to be overall improving, responding to treatment, closely monitor -Physical therapy recommended home health which has been arranged today   COVID-19 Labs  Recent Labs    10/11/19 2330 10/11/19 2330 10/12/19 0541 10/13/19 0300 10/14/19 0401  DDIMER 1.39*   < > 1.99* 1.70* 1.38*  FERRITIN 2,702*  --   --   --   --   LDH 269*  --   --   --   --   CRP 2.2*   < > 2.2* 3.0* 1.2*   < > = values in this interval not displayed.    Active Problems Essential hypertension -Hydralazine as needed, monitor  History of OA -Stable, monitor  Scheduled Meds: . brimonidine  1 drop Both Eyes BID  . enoxaparin (LOVENOX) injection  40 mg Subcutaneous Daily  . methylPREDNISolone (SOLU-MEDROL) injection  40 mg Intravenous Q12H   Continuous Infusions: . remdesivir 100 mg in NS 100 mL 100 mg (10/14/19 0859)   PRN Meds:.acetaminophen **OR** acetaminophen, hydrALAZINE, ondansetron **OR** ondansetron (ZOFRAN) IV, [START ON 10/16/2019] pneumococcal 23 valent vaccine  DVT prophylaxis:  Lovenox Code Status: Full code Family Communication: d/w daughter [name] [phone] Patient admitted from: Home Anticipated d/c place: Home Barriers to d/c: Hypoxia  Consultants:  None  Procedures:  None   Microbiology: None   Antimicrobials: None    Objective: Vitals:   10/13/19 2000 10/13/19 2036 10/14/19 0300 10/14/19 0731  BP:  (!) 130/56 (!) 133/53 134/67  Pulse:  (!) 58 (!) 47 (!) 53  Resp: 19 18 18 16   Temp:  97.7 F (36.5 C) 97.6 F (36.4 C) 98.4 F (36.9 C)  TempSrc:  Oral Other (Comment) Oral  SpO2: 95% 97% 95% 93%  Weight:      Height:        Intake/Output Summary (Last 24 hours) at 10/14/2019 1340 Last data filed at 10/14/2019 0200 Gross per 24 hour  Intake 530 ml  Output 400 ml  Net 130 ml   Filed Weights   10/12/19 0800  Weight: 87.1 kg    Examination:  Constitutional: No distress, in bed Eyes: No scleral icterus ENMT: Moist mucous membranes Neck: normal, supple Respiratory: Clear bilaterally, no wheezing, no crackles Cardiovascular: Regular rate and rhythm, no murmurs Abdomen: Soft, nontender, nondistended, bowel sounds positive Musculoskeletal: no clubbing / cyanosis.  Skin: No rashes Neurologic: Nonfocal, equal strength with generalized weakness present  Data Reviewed: I have independently reviewed following labs and imaging studies  CBC: Recent Labs  Lab 10/11/19 1536 10/12/19 0541 10/13/19 0300 10/14/19 0401  WBC 6.1  4.9 4.6 10.2  NEUTROABS 4.1 3.2  --   --   HGB 14.2 12.8* 11.2* 11.8*  HCT 45.7 41.3 36.4* 37.4*  MCV 90.1 91.2 89.9 89.3  PLT 197 185 219 160   Basic Metabolic Panel: Recent Labs  Lab 10/11/19 1536 10/11/19 1924 10/12/19 0541 10/13/19 0300 10/14/19 0401  NA 138  --  140 137 138  K 4.1  --  4.0 4.4 4.7  CL 102  --  107 108 110  CO2 24  --  25 21* 21*  GLUCOSE 114*  --  133* 137* 262*  BUN 13  --  13 18 20   CREATININE 1.17  --  1.14 1.05 0.97  CALCIUM 8.2*  --  7.7* 7.9* 8.5*  MG  --  2.3  --  2.0  2.0  PHOS  --   --   --  3.3  --    GFR: Estimated Creatinine Clearance: 69 mL/min (by C-G formula based on SCr of 0.97 mg/dL). Liver Function Tests: Recent Labs  Lab 10/12/19 0541 10/13/19 0300 10/14/19 0401  AST 30 28 30   ALT 25 28 34  ALKPHOS 31* 30* 37*  BILITOT 1.2 1.0 0.8  PROT 6.0* 6.3* 6.3*  ALBUMIN 2.8* 2.9* 3.0*   No results for input(s): LIPASE, AMYLASE in the last 168 hours. No results for input(s): AMMONIA in the last 168 hours. Coagulation Profile: No results for input(s): INR, PROTIME in the last 168 hours. Cardiac Enzymes: Recent Labs  Lab 10/12/19 0541  CKTOTAL 189   BNP (last 3 results) No results for input(s): PROBNP in the last 8760 hours. HbA1C: No results for input(s): HGBA1C in the last 72 hours. CBG: No results for input(s): GLUCAP in the last 168 hours. Lipid Profile: Recent Labs    10/11/19 2330  TRIG 82   Thyroid Function Tests: Recent Labs    10/12/19 0541  TSH 1.663   Anemia Panel: Recent Labs    10/11/19 2330  FERRITIN 2,702*   Urine analysis:    Component Value Date/Time   COLORURINE YELLOW 10/11/2019 1955   APPEARANCEUR CLEAR 10/11/2019 1955   LABSPEC 1.014 10/11/2019 1955   PHURINE 6.0 10/11/2019 1955   GLUCOSEU NEGATIVE 10/11/2019 1955   HGBUR SMALL (A) 10/11/2019 1955   BILIRUBINUR NEGATIVE 10/11/2019 [place] NEGATIVE 10/11/2019 1955   PROTEINUR 100 (A) 10/11/2019 1955   NITRITE NEGATIVE 10/11/2019 1955   LEUKOCYTESUR NEGATIVE 10/11/2019 1955   Sepsis Labs: Invalid input(s): PROCALCITONIN, LACTICIDVEN  Recent Results (from the past 240 hour(s))  Urine culture     Status: None   Collection Time: 10/11/19  7:56 PM   Specimen: Urine, Catheterized  Result Value Ref Range Status   Specimen Description URINE, CATHETERIZED  Final   Special Requests NONE  Final   Culture   Final    NO GROWTH Performed at [facility], [address] [place], [place] [address]    Report Status 10/12/2019 FINAL   Final  Blood Culture (routine x 2)     Status: None (Preliminary result)   Collection Time: 10/11/19 11:30 PM   Specimen: BLOOD LEFT FOREARM  Result Value Ref Range Status   Specimen Description BLOOD LEFT FOREARM  Final   Special Requests   Final    BOTTLES DRAWN AEROBIC AND ANAEROBIC Blood Culture adequate volume   Culture   Final    NO GROWTH 1 DAY Performed at [facility], [place] [address] [place], [place] [address]  Report Status PENDING  Incomplete  Blood Culture (routine x 2)     Status: None (Preliminary result)   Collection Time: 10/11/19 11:40 PM   Specimen: BLOOD RIGHT HAND  Result Value Ref Range Status   Specimen Description BLOOD RIGHT HAND  Final   Special Requests   Final    BOTTLES DRAWN AEROBIC AND ANAEROBIC Blood Culture adequate volume   Culture   Final    NO GROWTH 1 DAY Performed at [facility], [address], [place], [place] [address]    Report Status PENDING  Incomplete      Radiology Studies: No results found. Pamella Pert, MD, PhD [facility]  Between 7 am - 7 pm I am available, please contact me via Amion or Securechat  Between 7 pm - 7 am I am not available, please contact night coverage MD/APP via Amion

## 2019-10-14 NOTE — NC FL2 (Signed)
[place] LEVEL OF CARE SCREENING TOOL     IDENTIFICATION  Patient Name: Aaron Mendez Birthdate: October 13, 1945 Sex: male Admission Date (Current Location): 10/11/2019  [facility] and [place] Number:  Herbalist and Address:  [facility], [place] [address], [place], [place] [address]      Provider Number: [id number]  Attending Physician Name and Address:  Caren Griffins, MD  Relative Name and Phone Number:  daughter: Hayden Rasmussen  [phone]    Current Level of Care: Hospital Recommended Level of Care: [place] Prior Approval Number:    Date Approved/Denied:   PASRR Number: [id number]  Discharge Plan: SNF    Current Diagnoses: Patient Active Problem List   Diagnosis Date Noted  . Fever due to COVID-19 10/12/2019  . Dehydration 10/12/2019  . Acute bilateral low back pain without sciatica 05/06/2019  . Poison ivy dermatitis 02/04/2019  . Viral URI with cough 06/29/2018  . Erectile dysfunction 06/29/2018  . Encounter for well adult exam with abnormal findings 12/21/2017  . Enlarged prostate on rectal examination 12/21/2017  . Unilateral primary osteoarthritis, left knee 12/21/2017    Orientation RESPIRATION BLADDER Height & Weight     Self, Time, Situation, Place  Normal Continent Weight: 87.1 kg Height:  5\' 10"  (177.8 cm)  BEHAVIORAL SYMPTOMS/MOOD NEUROLOGICAL BOWEL NUTRITION STATUS      Continent Diet  AMBULATORY STATUS COMMUNICATION OF NEEDS Skin   Limited Assist Verbally Normal                       Personal Care Assistance Level of Assistance              Functional Limitations Info             SPECIAL CARE FACTORS FREQUENCY  PT (By licensed PT), OT (By licensed OT)     PT Frequency: 5x/week OT Frequency: min 3/week            Contractures Contractures Info: Not present    Additional Factors Info  Code Status Code Status Info: Full Code             Current  Medications (10/14/2019):  This is the current hospital active medication list Current Facility-Administered Medications  Medication Dose Route Frequency Provider Last Rate Last Admin  . acetaminophen (TYLENOL) tablet 650 mg  650 mg Oral Q6H PRN Rise Patience, MD   650 mg at 10/12/19 2209   Or  . acetaminophen (TYLENOL) suppository 650 mg  650 mg Rectal Q6H PRN Rise Patience, MD      . brimonidine (ALPHAGAN) 0.2 % ophthalmic solution 1 drop  1 drop Both Eyes BID Rise Patience, MD   1 drop at 10/14/19 [phone]  . enoxaparin (LOVENOX) injection 40 mg  40 mg Subcutaneous Daily Rise Patience, MD   40 mg at 10/14/19 0859  . hydrALAZINE (APRESOLINE) injection 10 mg  10 mg Intravenous Q4H PRN Rise Patience, MD      . methylPREDNISolone sodium succinate (SOLU-MEDROL) 40 mg/mL injection 40 mg  40 mg Intravenous Q12H Caren Griffins, MD   40 mg at 10/14/19 1238  . ondansetron (ZOFRAN) tablet 4 mg  4 mg Oral Q6H PRN Rise Patience, MD       Or  . ondansetron [facility]) injection 4 mg  4 mg Intravenous Q6H PRN Rise Patience, MD   4 mg at 10/12/19 1207  . [START ON 10/16/2019]  pneumococcal 23 valent vaccine (PNEUMOVAX-23) injection 0.5 mL  0.5 mL Intramuscular Prior to discharge Scarlett Presto, [facility]      . remdesivir 100 mg in sodium chloride 0.9 % 100 mL IVPB  100 mg Intravenous Daily Eduard Clos, MD 200 mL/hr at 10/14/19 0859 100 mg at 10/14/19 7943     Discharge Medications: Please see discharge summary for a list of discharge medications.  Relevant Imaging Results:  Relevant Lab Results:   Additional Information SSN: [id number]  Durenda Guthrie, RN

## 2019-10-15 LAB — COMPREHENSIVE METABOLIC PANEL
ALT: 48 U/L — ABNORMAL HIGH (ref 0–44)
AST: 40 U/L (ref 15–41)
Albumin: 3.2 g/dL — ABNORMAL LOW (ref 3.5–5.0)
Alkaline Phosphatase: 41 U/L (ref 38–126)
Anion gap: 15 (ref 5–15)
BUN: 21 mg/dL (ref 8–23)
CO2: 19 mmol/L — ABNORMAL LOW (ref 22–32)
Calcium: 8.2 mg/dL — ABNORMAL LOW (ref 8.9–10.3)
Chloride: 103 mmol/L (ref 98–111)
Creatinine, Ser: 0.91 mg/dL (ref 0.61–1.24)
GFR calc Af Amer: >60 mL/min (ref >60)
GFR calc non Af Amer: >60 mL/min (ref >60)
Glucose, Bld: 262 mg/dL — ABNORMAL HIGH (ref 70–99)
Potassium: 4.4 mmol/L (ref 3.5–5.1)
Sodium: 137 mmol/L (ref 135–145)
Total Bilirubin: 0.6 mg/dL (ref 0.3–1.2)
Total Protein: 6.6 g/dL (ref 6.5–8.1)

## 2019-10-15 LAB — CBC
HCT: 37.6 % — ABNORMAL LOW (ref 39.0–52.0)
Hemoglobin: 11.8 g/dL — ABNORMAL LOW (ref 13.0–17.0)
MCH: 27.6 pg (ref 26.0–34.0)
MCHC: 31.4 g/dL (ref 30.0–36.0)
MCV: 88.1 fL (ref 80.0–100.0)
Platelets: 349 10*3/uL (ref 150–400)
RBC: 4.27 MIL/uL (ref 4.22–5.81)
RDW: 12.2 % (ref 11.5–15.5)
WBC: 10.9 10*3/uL — ABNORMAL HIGH (ref 4.0–10.5)
nRBC: 0 % (ref 0.0–0.2)

## 2019-10-15 LAB — C-REACTIVE PROTEIN: CRP: 1.1 mg/dL — ABNORMAL HIGH (ref <1.0)

## 2019-10-15 LAB — D-DIMER, QUANTITATIVE: D-Dimer, Quant: 0.96 ug/mL-FEU — ABNORMAL HIGH (ref 0.00–0.50)

## 2019-10-15 LAB — MAGNESIUM: Magnesium: 2 mg/dL (ref 1.7–2.4)

## 2019-10-15 NOTE — Progress Notes (Signed)
PROGRESS NOTE  Aaron Mendez NIO:[id number] DOB: July 31, 1946 DOA: 10/11/2019 PCP: Luetta Nutting, DO   LOS: 3 days   Brief Narrative / Interim history: 74 year old male with history of OA came into the ER and was admitted to the hospital on 2/2 with increased weakness, fever and chills, as well as nonproductive cough.  He was found to be febrile in the ED, appeared dehydrated, Covid test came back positive and was admitted to the hospital.  Subjective / 24h Interval events: Feels well, feels stronger.  Denies any chest pain, denies any shortness of breath  Assessment & Plan:  Principal Problem Acute Hypoxic Respiratory Failure due to Covid-19 Viral Illness -Hypoxia seems to have resolved, he was started on remdesivir and continue for a total of 5 days, tomorrow last dose -Given hypoxia and requirement of 2 L nasal cannula he was also started on steroids, currently on room air.  Remains on room air -Seems to be overall improving, responding to treatment, closely monitor -Physical therapy recommended home health which has been arranged on Friday   COVID-19 Labs  Recent Labs    10/13/19 0300 10/14/19 0401 10/15/19 0241  DDIMER 1.70* 1.38* 0.96*  CRP 3.0* 1.2* 1.1*    Active Problems Essential hypertension -Hydralazine as needed, monitor  History of OA -Stable, monitor  Scheduled Meds: . brimonidine  1 drop Both Eyes BID  . enoxaparin (LOVENOX) injection  40 mg Subcutaneous Daily  . methylPREDNISolone (SOLU-MEDROL) injection  40 mg Intravenous Q12H   Continuous Infusions: . remdesivir 100 mg in NS 100 mL 100 mg (10/15/19 0933)   PRN Meds:.acetaminophen **OR** acetaminophen, hydrALAZINE, ondansetron **OR** ondansetron (ZOFRAN) IV, [START ON 10/16/2019] pneumococcal 23 valent vaccine  DVT prophylaxis: Lovenox Code Status: Full code Family Communication: d/w daughter Letta Median [phone] Patient admitted from: Home Anticipated d/c place: Home Barriers to d/c: Hypoxia   Consultants:  None  Procedures:  None   Microbiology: None   Antimicrobials: None    Objective: Vitals:   10/14/19 1507 10/14/19 1930 10/15/19 0300 10/15/19 0722  BP: (!) 139/57 130/67 134/70 (!) 125/57  Pulse: (!) 44 (!) 54 (!) 49 (!) 48  Resp:  18 20 16   Temp: 98.1 F (36.7 C) 98.7 F (37.1 C) 98 F (36.7 C) 98.3 F (36.8 C)  TempSrc: Oral Oral Other (Comment) Oral  SpO2: 99% 94% 98% 95%  Weight:      Height:        Intake/Output Summary (Last 24 hours) at 10/15/2019 1138 Last data filed at 10/14/2019 1900 Gross per 24 hour  Intake 1090 ml  Output 1150 ml  Net -60 ml   Filed Weights   10/12/19 0800  Weight: 87.1 kg    Examination: Constitutional: NAD Eyes: no icterus  ENMT: mm Neck: normal, supple Respiratory: CTA biL, no wheezing  Cardiovascular: RRR, no murmurs  Abdomen: soft, NT, ND, BS+ Musculoskeletal: no clubbing / cyanosis.  Skin: no rashes  Neurologic: non focal, ambulatory   Data Reviewed: I have independently reviewed following labs and imaging studies  CBC: Recent Labs  Lab 10/11/19 1536 10/12/19 0541 10/13/19 0300 10/14/19 0401 10/15/19 0241  WBC 6.1 4.9 4.6 10.2 10.9*  NEUTROABS 4.1 3.2  --   --   --   HGB 14.2 12.8* 11.2* 11.8* 11.8*  HCT 45.7 41.3 36.4* 37.4* 37.6*  MCV 90.1 91.2 89.9 89.3 88.1  PLT 197 185 219 285 967   Basic Metabolic Panel: Recent Labs  Lab 10/11/19 1536 10/11/19 1924 10/12/19 0541 10/13/19 0300 10/14/19 0401  10/15/19 0241  NA 138  --  140 137 138 137  K 4.1  --  4.0 4.4 4.7 4.4  CL 102  --  107 108 110 103  CO2 24  --  25 21* 21* 19*  GLUCOSE 114*  --  133* 137* 262* 262*  BUN 13  --  13 18 20 21   CREATININE 1.17  --  1.14 1.05 0.97 0.91  CALCIUM 8.2*  --  7.7* 7.9* 8.5* 8.2*  MG  --  2.3  --  2.0 2.0 2.0  PHOS  --   --   --  3.3  --   --    GFR: Estimated Creatinine Clearance: 73.5 mL/min (by C-G formula based on SCr of 0.91 mg/dL). Liver Function Tests: Recent Labs  Lab 10/12/19 0541  10/13/19 0300 10/14/19 0401 10/15/19 0241  AST 30 28 30  40  ALT 25 28 34 48*  ALKPHOS 31* 30* 37* 41  BILITOT 1.2 1.0 0.8 0.6  PROT 6.0* 6.3* 6.3* 6.6  ALBUMIN 2.8* 2.9* 3.0* 3.2*   No results for input(s): LIPASE, AMYLASE in the last 168 hours. No results for input(s): AMMONIA in the last 168 hours. Coagulation Profile: No results for input(s): INR, PROTIME in the last 168 hours. Cardiac Enzymes: Recent Labs  Lab 10/12/19 0541  CKTOTAL 189   BNP (last 3 results) No results for input(s): PROBNP in the last 8760 hours. HbA1C: No results for input(s): HGBA1C in the last 72 hours. CBG: No results for input(s): GLUCAP in the last 168 hours. Lipid Profile: No results for input(s): CHOL, HDL, LDLCALC, TRIG, CHOLHDL, LDLDIRECT in the last 72 hours. Thyroid Function Tests: No results for input(s): TSH, T4TOTAL, FREET4, T3FREE, THYROIDAB in the last 72 hours. Anemia Panel: No results for input(s): VITAMINB12, FOLATE, FERRITIN, TIBC, IRON, RETICCTPCT in the last 72 hours. Urine analysis:    Component Value Date/Time   COLORURINE YELLOW 10/11/2019 1955   APPEARANCEUR CLEAR 10/11/2019 1955   LABSPEC 1.014 10/11/2019 1955   PHURINE 6.0 10/11/2019 1955   GLUCOSEU NEGATIVE 10/11/2019 1955   HGBUR SMALL (A) 10/11/2019 1955   BILIRUBINUR NEGATIVE 10/11/2019 1955   KETONESUR NEGATIVE 10/11/2019 1955   PROTEINUR 100 (A) 10/11/2019 1955   NITRITE NEGATIVE 10/11/2019 1955   LEUKOCYTESUR NEGATIVE 10/11/2019 1955   Sepsis Labs: Invalid input(s): PROCALCITONIN, LACTICIDVEN  Recent Results (from the past 240 hour(s))  Urine culture     Status: None   Collection Time: 10/11/19  7:56 PM   Specimen: Urine, Catheterized  Result Value Ref Range Status   Specimen Description URINE, CATHETERIZED  Final   Special Requests NONE  Final   Culture   Final    NO GROWTH Performed at [facility], [address] [place], [address] [place]    Report Status 10/12/2019 FINAL  Final  Blood  Culture (routine x 2)     Status: None (Preliminary result)   Collection Time: 10/11/19 11:30 PM   Specimen: BLOOD LEFT FOREARM  Result Value Ref Range Status   Specimen Description BLOOD LEFT FOREARM  Final   Special Requests   Final    BOTTLES DRAWN AEROBIC AND ANAEROBIC Blood Culture adequate volume   Culture   Final    NO GROWTH 3 DAYS Performed at [facility], [address], [place], [address] [place]    Report Status PENDING  Incomplete  Blood Culture (routine x 2)     Status: None (Preliminary result)   Collection Time: 10/11/19  11:40 PM   Specimen: BLOOD RIGHT HAND  Result Value Ref Range Status   Specimen Description BLOOD RIGHT HAND  Final   Special Requests   Final    BOTTLES DRAWN AEROBIC AND ANAEROBIC Blood Culture adequate volume   Culture   Final    NO GROWTH 3 DAYS Performed at [facility], [address], [place], [place] [address]    Report Status PENDING  Incomplete      Radiology Studies: No results found. Pamella Pert, MD, PhD [facility]  Between 7 am - 7 pm I am available, please contact me via Amion or Securechat  Between 7 pm - 7 am I am not available, please contact night coverage MD/APP via Amion

## 2019-10-16 LAB — BASIC METABOLIC PANEL
Anion gap: 10 (ref 5–15)
BUN: 26 mg/dL — ABNORMAL HIGH (ref 8–23)
CO2: 22 mmol/L (ref 22–32)
Calcium: 8.9 mg/dL (ref 8.9–10.3)
Chloride: 104 mmol/L (ref 98–111)
Creatinine, Ser: 1.06 mg/dL (ref 0.61–1.24)
GFR calc Af Amer: >60 mL/min (ref >60)
GFR calc non Af Amer: >60 mL/min (ref >60)
Glucose, Bld: 415 mg/dL — ABNORMAL HIGH (ref 70–99)
Potassium: 4.8 mmol/L (ref 3.5–5.1)
Sodium: 136 mmol/L (ref 135–145)

## 2019-10-16 LAB — CBC
HCT: 39.5 % (ref 39.0–52.0)
Hemoglobin: 12.5 g/dL — ABNORMAL LOW (ref 13.0–17.0)
MCH: 28 pg (ref 26.0–34.0)
MCHC: 31.6 g/dL (ref 30.0–36.0)
MCV: 88.4 fL (ref 80.0–100.0)
Platelets: 424 10*3/uL — ABNORMAL HIGH (ref 150–400)
RBC: 4.47 MIL/uL (ref 4.22–5.81)
RDW: 12.2 % (ref 11.5–15.5)
WBC: 14.7 10*3/uL — ABNORMAL HIGH (ref 4.0–10.5)
nRBC: 0.3 % — ABNORMAL HIGH (ref 0.0–0.2)

## 2019-10-16 LAB — GLUCOSE, CAPILLARY
Glucose-Capillary: 423 mg/dL — ABNORMAL HIGH (ref 70–99)
Glucose-Capillary: 418 mg/dL — ABNORMAL HIGH (ref 70–99)
Glucose-Capillary: 295 mg/dL — ABNORMAL HIGH (ref 70–99)
Glucose-Capillary: 356 mg/dL — ABNORMAL HIGH (ref 70–99)

## 2019-10-16 LAB — HEMOGLOBIN A1C
Hgb A1c MFr Bld: 5.8 % — ABNORMAL HIGH (ref 4.8–5.6)
Mean Plasma Glucose: 119.76 mg/dL

## 2019-10-16 MED ORDER — METFORMIN HCL 500 MG PO TABS
500.0000 mg | ORAL_TABLET | Freq: Two times a day (BID) | ORAL | Status: DC
  Administered 2019-10-16 – 2019-10-17 (×2): 500 mg via ORAL
  Filled 2019-10-16 (×2): qty 1

## 2019-10-16 MED ORDER — INSULIN ASPART 100 UNIT/ML ~~LOC~~ SOLN: 0.0000 [IU] | Freq: Three times a day (TID) | SUBCUTANEOUS | Status: DC

## 2019-10-16 MED ORDER — INSULIN ASPART 100 UNIT/ML ~~LOC~~ SOLN
0.0000 [IU] | Freq: Every day | SUBCUTANEOUS | Status: DC
  Administered 2019-10-16: 5 [IU] via SUBCUTANEOUS

## 2019-10-16 MED ORDER — INSULIN ASPART 100 UNIT/ML ~~LOC~~ SOLN
0.0000 [IU] | Freq: Three times a day (TID) | SUBCUTANEOUS | Status: DC
  Administered 2019-10-16: 17:00:00 5 [IU] via SUBCUTANEOUS
  Administered 2019-10-16: 10:00:00 9 [IU] via SUBCUTANEOUS
  Administered 2019-10-16: 14 [IU] via SUBCUTANEOUS
  Administered 2019-10-17: 5 [IU] via SUBCUTANEOUS

## 2019-10-16 MED ORDER — DEXAMETHASONE 6 MG PO TABS
6.0000 mg | ORAL_TABLET | Freq: Every day | ORAL | Status: DC
  Administered 2019-10-16 – 2019-10-17 (×2): 6 mg via ORAL
  Filled 2019-10-16 (×2): qty 1

## 2019-10-16 NOTE — Progress Notes (Signed)
Pt's blood sugar was checked at around 21:00 and was found to be 356.  Dr. Dwana Curd paged concerning blood sugar. Dr. Dwana Curd returned page and will put orders for bed time insulin coverage.

## 2019-10-16 NOTE — Progress Notes (Signed)
PROGRESS NOTE  Aaron Mendez WNI:[id number] DOB: 01-09-1946 DOA: 10/11/2019 PCP: Luetta Nutting, DO   LOS: 4 days   Brief Narrative / Interim history: 74 year old male with history of OA came into the ER and was admitted to the hospital on 2/2 with increased weakness, fever and chills, as well as nonproductive cough.  He was found to be febrile in the ED, appeared dehydrated, Covid test came back positive and was admitted to the hospital.  Subjective / 24h Interval events: Doing well today, was able to ambulate, no problems, feeling stronger  Assessment & Plan:  Principal Problem Acute Hypoxic Respiratory Failure due to Covid-19 Viral Illness -Hypoxia seems to have resolved, he was started on remdesivir and continue for a total of 5 days, tomorrow last dose -Given hypoxia and requirement of 2 L nasal cannula he was also started on steroids, currently on room air.  Remains on room air -Seems to be overall improving, responding to treatment, closely monitor -Physical therapy recommended home health which has been arranged on Friday   COVID-19 Labs  Recent Labs    10/14/19 0401 10/15/19 0241  DDIMER 1.38* 0.96*  CRP 1.2* 1.1*    Active Problems Hyperglycemia, steroid-induced -Patient is not a known diabetic, his CBGs have been 1-200 and relatively stable however this morning on BMP he is in the 400s.  Accu-Cheks confirm elevated CBGs, will start sliding scale. -Hold discharge given this new information -Patient's hemoglobin A1c is 5.8, he is prediabetic but no frank diabetes -Stop IV Solu-Medrol, changed to Decadron, keep on sliding scale and once CBGs improved he will be able to go home  Essential hypertension -Hydralazine as needed, monitor  History of OA -Stable, monitor  Scheduled Meds: . brimonidine  1 drop Both Eyes BID  . dexamethasone  6 mg Oral Daily  . enoxaparin (LOVENOX) injection  40 mg Subcutaneous Daily  . insulin aspart  0-9 Units Subcutaneous TID WC  .  metFORMIN  500 mg Oral BID WC   Continuous Infusions:  PRN Meds:.acetaminophen **OR** acetaminophen, hydrALAZINE, ondansetron **OR** ondansetron (ZOFRAN) IV, pneumococcal 23 valent vaccine  DVT prophylaxis: Lovenox Code Status: Full code Family Communication: d/w daughter Letta Median [phone] Patient admitted from: Home Anticipated d/c place: Home Barriers to d/c: Sudden onset CBG elevation, anticipate discharge tomorrow if sugars better  Consultants:  None  Procedures:  None   Microbiology: None   Antimicrobials: None    Objective: Vitals:   10/15/19 1609 10/15/19 1936 10/16/19 0214 10/16/19 0756  BP: (!) 107/50 (!) 149/66 (!) 123/58 126/66  Pulse: (!) 50 (!) 56 (!) 50 (!) 52  Resp: 16 17 16 18   Temp: 98.1 F (36.7 C) (!) 97.5 F (36.4 C) 98 F (36.7 C) 98.1 F (36.7 C)  TempSrc: Oral Oral Oral Oral  SpO2: 91% 95% 92% 95%  Weight:      Height:        Intake/Output Summary (Last 24 hours) at 10/16/2019 1336 Last data filed at 10/16/2019 1100 Gross per 24 hour  Intake 450 ml  Output 900 ml  Net -450 ml   Filed Weights   10/12/19 0800  Weight: 87.1 kg    Examination: Constitutional: No distress Eyes: No icterus ENMT: Moist mucous membranes Neck: normal, supple Respiratory: Clear bilaterally without wheezing Cardiovascular: Regular rate and rhythm, no murmurs, no edema Abdomen: Soft, nontender, bowel sounds positive Musculoskeletal: no clubbing / cyanosis.  Skin: No rashes seen Neurologic: non focal, ambulatory   Data Reviewed: I have independently reviewed following labs and  imaging studies  CBC: Recent Labs  Lab 10/11/19 1536 10/11/19 1536 10/12/19 0541 10/13/19 0300 10/14/19 0401 10/15/19 0241 10/16/19 0600  WBC 6.1   < > 4.9 4.6 10.2 10.9* 14.7*  NEUTROABS 4.1  --  3.2  --   --   --   --   HGB 14.2   < > 12.8* 11.2* 11.8* 11.8* 12.5*  HCT 45.7   < > 41.3 36.4* 37.4* 37.6* 39.5  MCV 90.1   < > 91.2 89.9 89.3 88.1 88.4  PLT 197   < > 185 219  285 349 424*   < > = values in this interval not displayed.   Basic Metabolic Panel: Recent Labs  Lab 10/11/19 1536 10/11/19 1924 10/12/19 0541 10/13/19 0300 10/14/19 0401 10/15/19 0241 10/16/19 0600  NA   < >  --  140 137 138 137 136  K   < >  --  4.0 4.4 4.7 4.4 4.8  CL   < >  --  107 108 110 103 104  CO2   < >  --  25 21* 21* 19* 22  GLUCOSE   < >  --  133* 137* 262* 262* 415*  BUN   < >  --  13 18 20 21  26*  CREATININE   < >  --  1.14 1.05 0.97 0.91 1.06  CALCIUM   < >  --  7.7* 7.9* 8.5* 8.2* 8.9  MG  --  2.3  --  2.0 2.0 2.0  --   PHOS  --   --   --  3.3  --   --   --    < > = values in this interval not displayed.   GFR: Estimated Creatinine Clearance: 63.1 mL/min (by C-G formula based on SCr of 1.06 mg/dL). Liver Function Tests: Recent Labs  Lab 10/12/19 0541 10/13/19 0300 10/14/19 0401 10/15/19 0241  AST 30 28 30  40  ALT 25 28 34 48*  ALKPHOS 31* 30* 37* 41  BILITOT 1.2 1.0 0.8 0.6  PROT 6.0* 6.3* 6.3* 6.6  ALBUMIN 2.8* 2.9* 3.0* 3.2*   No results for input(s): LIPASE, AMYLASE in the last 168 hours. No results for input(s): AMMONIA in the last 168 hours. Coagulation Profile: No results for input(s): INR, PROTIME in the last 168 hours. Cardiac Enzymes: Recent Labs  Lab 10/12/19 0541  CKTOTAL 189   BNP (last 3 results) No results for input(s): PROBNP in the last 8760 hours. HbA1C: Recent Labs    10/16/19 0600  HGBA1C 5.8*   CBG: Recent Labs  Lab 10/16/19 0923 10/16/19 1207  GLUCAP 423* 418*   Lipid Profile: No results for input(s): CHOL, HDL, LDLCALC, TRIG, CHOLHDL, LDLDIRECT in the last 72 hours. Thyroid Function Tests: No results for input(s): TSH, T4TOTAL, FREET4, T3FREE, THYROIDAB in the last 72 hours. Anemia Panel: No results for input(s): VITAMINB12, FOLATE, FERRITIN, TIBC, IRON, RETICCTPCT in the last 72 hours. Urine analysis:    Component Value Date/Time   COLORURINE YELLOW 10/11/2019 1955   APPEARANCEUR CLEAR 10/11/2019 1955     LABSPEC 1.014 10/11/2019 1955   PHURINE 6.0 10/11/2019 1955   GLUCOSEU NEGATIVE 10/11/2019 1955   HGBUR SMALL (A) 10/11/2019 1955   BILIRUBINUR NEGATIVE 10/11/2019 1955   KETONESUR NEGATIVE 10/11/2019 1955   PROTEINUR 100 (A) 10/11/2019 1955   NITRITE NEGATIVE 10/11/2019 1955   LEUKOCYTESUR NEGATIVE 10/11/2019 1955   Sepsis Labs: Invalid input(s): PROCALCITONIN, LACTICIDVEN  Recent Results (from the past 240 hour(s))  Urine culture     Status: None   Collection Time: 10/11/19  7:56 PM   Specimen: Urine, Catheterized  Result Value Ref Range Status   Specimen Description URINE, CATHETERIZED  Final   Special Requests NONE  Final   Culture   Final    NO GROWTH Performed at [facility], [address], [place], [place] [address]    Report Status 10/12/2019 FINAL  Final  Blood Culture (routine x 2)     Status: None (Preliminary result)   Collection Time: 10/11/19 11:30 PM   Specimen: BLOOD LEFT FOREARM  Result Value Ref Range Status   Specimen Description BLOOD LEFT FOREARM  Final   Special Requests   Final    BOTTLES DRAWN AEROBIC AND ANAEROBIC Blood Culture adequate volume   Culture   Final    NO GROWTH 4 DAYS Performed at [facility], [address] [place], [place] [address]    Report Status PENDING  Incomplete  Blood Culture (routine x 2)     Status: None (Preliminary result)   Collection Time: 10/11/19 11:40 PM   Specimen: BLOOD RIGHT HAND  Result Value Ref Range Status   Specimen Description BLOOD RIGHT HAND  Final   Special Requests   Final    BOTTLES DRAWN AEROBIC AND ANAEROBIC Blood Culture adequate volume   Culture   Final    NO GROWTH 4 DAYS Performed at [facility], [address], [place], [place] [address]    Report Status PENDING  Incomplete      Radiology Studies: No results found. Pamella Pert, MD, PhD [facility]  Between 7 am - 7 pm I am available, please contact me via Amion or Securechat  Between 7 pm - 7 am  I am not available, please contact night coverage MD/APP via Amion

## 2019-10-16 NOTE — Progress Notes (Signed)
Spoke to patient's daughter and gave updates.

## 2019-10-16 NOTE — Plan of Care (Signed)

## 2019-10-17 LAB — GLUCOSE, CAPILLARY
Glucose-Capillary: 268 mg/dL — ABNORMAL HIGH (ref 70–99)
Glucose-Capillary: 274 mg/dL — ABNORMAL HIGH (ref 70–99)

## 2019-10-17 MED ORDER — DEXAMETHASONE 6 MG PO TABS: 6.0000 mg | ORAL_TABLET | Freq: Every day | ORAL | 0 refills | Status: DC

## 2019-10-17 MED ORDER — METFORMIN HCL 500 MG PO TABS: 500.0000 mg | ORAL_TABLET | Freq: Two times a day (BID) | ORAL | 0 refills | Status: DC

## 2019-10-17 NOTE — Progress Notes (Signed)
1100Spoke to pt daughter who is an employee here at [employer]. [place] working today and she reports her mom has fallen and broke her hip.  So I verified with Charge Nurse that pt will be taken home via EMS. I notified pt daughter that I will call her back and go over discharge info with her

## 2019-10-17 NOTE — Discharge Summary (Signed)
Physician Discharge Summary  Aaron Mendez SWF:[id number] DOB: [birth year] DOA: 10/11/2019  PCP: Everrett Coombe, DO  Admit date: 10/11/2019 Discharge date: 10/17/2019  Admitted From: home Disposition:  home  Recommendations for Outpatient Follow-up:  1. Follow up with PCP in 1-2 weeks  Home Health: PT Equipment/Devices: none  Discharge Condition: stable CODE STATUS: Full code Diet recommendation: regular  HPI: Per admitting MD, Aaron Mendez is a 74 y.o. male with history of osteoarthritis presents to the ER with complaints of increasing weakness over the last 1 week.  Has been having subjective feeling of fever chills nonproductive cough.  Denies any nausea vomiting diarrhea chest pain.  Denies any recent travel or sick contacts. ED Course: In the ER patient had a fever 102.4 F appears dehydrated.  Blood cultures were obtained along with UA urine cultures and chest x-ray was showing no infiltrates UA is unremarkable.  Covid test came back positive.  Patient's labs show CBC was unremarkable creatinine 1.17 ferritin was 2700 CRP 2.2 procalcitonin less than 0.10.  Patient was given 1 L fluid bolus admitted for dehydration weakness secondary to COVID-19 infection with fever and started on IV remdesivir.  Hospital Course / Discharge diagnoses: Principal Problem Acute Hypoxic Respiratory Failure due to Covid-19 Viral Illness -patient was admitted to the hospital and started on remdesivir, steroids.  He clinically improved, his hypoxia resolved and he is comfortable on room air.  Worked with physical therapy without difficulties, he had no further hypoxia with ambulation and was discharged home in stable condition.  Home health therapies were arranged.  He still has 4 additional days of steroids upon discharge.  COVID-19 Labs  Recent Labs    10/15/19 0241  DDIMER 0.96*  CRP 1.1*    Active Problems Hyperglycemia, steroid-induced -patient had steroid-induced hyperglycemia, his A1c is 5.8 and  there is no history of diabetes.  Given elevated CBGs and steroids for a few more days in the setting of COVID-19 he will be given Metformin for a short period of time.  He was advised to follow-up with PCP. History of OA Hyperlipidemia-continue statin  Discharge Instructions   Allergies as of 10/17/2019   No Known Allergies     Medication List    TAKE these medications   atorvastatin 20 MG tablet Commonly known as: LIPITOR TAKE 1 TABLET(20 MG) BY MOUTH DAILY What changed: See the new instructions.   brimonidine 0.2 % ophthalmic solution Commonly known as: ALPHAGAN Place 1 drop into both eyes 2 (two) times daily.   dexamethasone 6 MG tablet Commonly known as: DECADRON Take 1 tablet (6 mg total) by mouth daily.   ibuprofen 800 MG tablet Commonly known as: ADVIL Take 800 mg by mouth 3 (three) times daily as needed (pain.).   metFORMIN 500 MG tablet Commonly known as: GLUCOPHAGE Take 1 tablet (500 mg total) by mouth 2 (two) times daily with a meal.   multivitamin with minerals Tabs tablet Take 1 tablet by mouth daily.   naproxen 500 MG tablet Commonly known as: NAPROSYN TAKE 1 TABLET(500 MG) BY MOUTH TWICE DAILY WITH A MEAL What changed:   how much to take  how to take this  when to take this  reasons to take this  additional instructions            Durable Medical Equipment  (From admission, onward)         Start     Ordered   10/14/19 1005  For home use only DME 3 n 1  Once     10/14/19 1004         Follow-up Information    [facility] Follow up.   Specialty: Home Health Services Why: A representative from [facility] will contact you to arrange start date and time for your therapy. Contact information: [address] [place] [address] [phone]        [facility] Follow up.   Why: you will receive Rolling walker and 3in1 from [employer]. It will be delivered to your room.  Contact  information: [address] [place] [address] [phone]           Consultations:  None   Procedures/Studies:  DG Chest Port 1 View  Result Date: 10/11/2019 CLINICAL DATA:  Loss of appetite for 1 week with fatigue. Diagnosed with COVID-19 infection yesterday. EXAM: PORTABLE CHEST 1 VIEW COMPARISON:  Radiographs 07/26/2015. FINDINGS: 1353 hours. The heart size and mediastinal contours are normal. The lungs are clear. There is no pleural effusion or pneumothorax. No acute osseous findings are identified. IMPRESSION: No active cardiopulmonary process. Electronically Signed   By: Carey Bullocks M.D.   On: 10/11/2019 14:21     Subjective: - no chest pain, shortness of breath, no abdominal pain, nausea or vomiting.   Discharge Exam: BP 114/78   Pulse 63   Temp 98 F (36.7 C) (Oral)   Resp 18   Ht 5\' 10"  (1.778 m)   Wt 87.1 kg   SpO2 98%   BMI 27.55 kg/m   General: Pt is alert, awake, not in acute distress Cardiovascular: RRR, S1/S2 +, no rubs, no gallops Respiratory: CTA bilaterally, no wheezing, no rhonchi Abdominal: Soft, NT, ND, bowel sounds + Extremities: no edema, no cyanosis    The results of significant diagnostics from this hospitalization (including imaging, microbiology, ancillary and laboratory) are listed below for reference.     Microbiology: Recent Results (from the past 240 hour(s))  Urine culture     Status: None   Collection Time: 10/11/19  7:56 PM   Specimen: Urine, Catheterized  Result Value Ref Range Status   Specimen Description URINE, CATHETERIZED  Final   Special Requests NONE  Final   Culture   Final    NO GROWTH Performed at [facility], [address] [place], [place]    Report Status 10/12/2019 FINAL  Final  Blood Culture (routine x 2)     Status: None (Preliminary result)   Collection Time: 10/11/19 11:30 PM   Specimen: BLOOD LEFT FOREARM  Result Value Ref Range Status   Specimen Description BLOOD  LEFT FOREARM  Final   Special Requests   Final    BOTTLES DRAWN AEROBIC AND ANAEROBIC Blood Culture adequate volume   Culture   Final    NO GROWTH 4 DAYS Performed at [facility], [address] [place], [place]    Report Status PENDING  Incomplete  Blood Culture (routine x 2)     Status: None (Preliminary result)   Collection Time: 10/11/19 11:40 PM   Specimen: BLOOD RIGHT HAND  Result Value Ref Range Status   Specimen Description BLOOD RIGHT HAND  Final   Special Requests   Final    BOTTLES DRAWN AEROBIC AND ANAEROBIC Blood Culture adequate volume   Culture   Final    NO GROWTH 4 DAYS Performed at [facility], [address], [place], [place]    Report Status PENDING  Incomplete     Labs: Basic Metabolic Panel: Recent Labs  Lab 10/11/19 1536 10/11/19 1924 10/12/19 0541 10/13/19 0300 10/14/19 0401 10/15/19 0241 10/16/19 0600  NA   < >  --  140 137 138 137 136  K   < >  --  4.0 4.4 4.7 4.4 4.8  CL   < >  --  107 108 110 103 104  CO2   < >  --  25 21* 21* 19* 22  GLUCOSE   < >  --  133* 137* 262* 262* 415*  BUN   < >  --  13 18 20 21  26*  CREATININE   < >  --  1.14 1.05 0.97 0.91 1.06  CALCIUM   < >  --  7.7* 7.9* 8.5* 8.2* 8.9  MG  --  2.3  --  2.0 2.0 2.0  --   PHOS  --   --   --  3.3  --   --   --    < > = values in this interval not displayed.   Liver Function Tests: Recent Labs  Lab 10/12/19 0541 10/13/19 0300 10/14/19 0401 10/15/19 0241  AST 30 28 30  40  ALT 25 28 34 48*  ALKPHOS 31* 30* 37* 41  BILITOT 1.2 1.0 0.8 0.6  PROT 6.0* 6.3* 6.3* 6.6  ALBUMIN 2.8* 2.9* 3.0* 3.2*   CBC: Recent Labs  Lab 10/11/19 1536 10/11/19 1536 10/12/19 0541 10/13/19 0300 10/14/19 0401 10/15/19 0241 10/16/19 0600  WBC 6.1   < > 4.9 4.6 10.2 10.9* 14.7*  NEUTROABS 4.1  --  3.2  --   --   --   --   HGB 14.2   < > 12.8* 11.2* 11.8* 11.8* 12.5*  HCT 45.7   < > 41.3 36.4* 37.4* 37.6* 39.5  MCV 90.1   < > 91.2 89.9 89.3 88.1 88.4   PLT 197   < > 185 219 285 349 424*   < > = values in this interval not displayed.   CBG: Recent Labs  Lab 10/16/19 0923 10/16/19 1207 10/16/19 1616 10/16/19 2105 10/17/19 0739  GLUCAP 423* 418* 295* 356* 268*   Hgb A1c Recent Labs    10/16/19 0600  HGBA1C 5.8*   Lipid Profile No results for input(s): CHOL, HDL, LDLCALC, TRIG, CHOLHDL, LDLDIRECT in the last 72 hours. Thyroid function studies No results for input(s): TSH, T4TOTAL, T3FREE, THYROIDAB in the last 72 hours.  Invalid input(s): FREET3 Urinalysis    Component Value Date/Time   COLORURINE YELLOW 10/11/2019 1955   APPEARANCEUR CLEAR 10/11/2019 1955   LABSPEC 1.014 10/11/2019 1955   PHURINE 6.0 10/11/2019 1955   GLUCOSEU NEGATIVE 10/11/2019 1955   HGBUR SMALL (A) 10/11/2019 1955   BILIRUBINUR NEGATIVE 10/11/2019 1955   KETONESUR NEGATIVE 10/11/2019 1955   PROTEINUR 100 (A) 10/11/2019 1955   NITRITE NEGATIVE 10/11/2019 1955   LEUKOCYTESUR NEGATIVE 10/11/2019 1955    FURTHER DISCHARGE INSTRUCTIONS:   Get Medicines reviewed and adjusted: Please take all your medications with you for your next visit with your Primary MD   Laboratory/radiological data: Please request your Primary MD to go over all hospital tests and procedure/radiological results at the follow up, please ask your Primary MD to get all Hospital records sent to his/her office.   In some cases, they will be blood work, cultures and biopsy results pending at the time of your discharge. Please request that your primary care M.D. goes through all the records  of your hospital data and follows up on these results.   Also Note the following: If you experience worsening of your admission symptoms, develop shortness of breath, life threatening emergency, suicidal or homicidal thoughts you must seek medical attention immediately by calling 911 or calling your MD immediately  if symptoms less severe.   You must read complete instructions/literature along  with all the possible adverse reactions/side effects for all the Medicines you take and that have been prescribed to you. Take any new Medicines after you have completely understood and accpet all the possible adverse reactions/side effects.    Do not drive when taking Pain medications or sleeping medications (Benzodaizepines)   Do not take more than prescribed Pain, Sleep and Anxiety Medications. It is not advisable to combine anxiety,sleep and pain medications without talking with your primary care practitioner   Special Instructions: If you have smoked or chewed Tobacco  in the last 2 yrs please stop smoking, stop any regular Alcohol  and or any Recreational drug use.   Wear Seat belts while driving.   Please note: You were cared for by a hospitalist during your hospital stay. Once you are discharged, your primary care physician will handle any further medical issues. Please note that NO REFILLS for any discharge medications will be authorized once you are discharged, as it is imperative that you return to your primary care physician (or establish a relationship with a primary care physician if you do not have one) for your post hospital discharge needs so that they can reassess your need for medications and monitor your lab values.  Time coordinating discharge: 25 minutes  SIGNED:  Marzetta Board, MD, PhD 10/17/2019, 9:47 AM

## 2019-10-17 NOTE — Discharge Instructions (Signed)
Follow with Aaron Mendez, [place], DO in 2-3 weeks  Please get a complete blood count and chemistry panel checked by your Primary MD at your next visit, and again as instructed by your Primary MD. Please get your medications reviewed and adjusted by your Primary MD.  Please request your Primary MD to go over all Hospital Tests and Procedure/Radiological results at the follow up, please get all Hospital records sent to your Prim MD by signing hospital release before you go home.  In some cases, there will be blood work, cultures and biopsy results pending at the time of your discharge. Please request that your primary care M.D. goes through all the records of your hospital data and follows up on these results.  If you had Pneumonia of Lung problems at the Hospital: Please get a 2 view Chest X ray done in 6-8 weeks after hospital discharge or sooner if instructed by your Primary MD.  If you have Congestive Heart Failure: Please call your Cardiologist or Primary MD anytime you have any of the following symptoms:  1) 3 pound weight gain in 24 hours or 5 pounds in 1 week  2) shortness of breath, with or without a dry hacking cough  3) swelling in the hands, feet or stomach  4) if you have to sleep on extra pillows at night in order to breathe  Follow cardiac low salt diet and 1.5 lit/day fluid restriction.  If you have diabetes Accuchecks 4 times/day, Once in AM empty stomach and then before each meal. Log in all results and show them to your primary doctor at your next visit. If any glucose reading is under 80 or above 300 call your primary MD immediately.  If you have Seizure/Convulsions/Epilepsy: Please do not drive, operate heavy machinery, participate in activities at heights or participate in high speed sports until you have seen by Primary MD or a Neurologist and advised to do so again. [place] [place] [facility] [place], patients with seizures are not allowed to drive until they have been  seizure-free for six months.  Use caution when using heavy equipment or power tools. Avoid working on ladders or at heights. Take showers instead of baths. Ensure the water temperature is not too high on the home water heater. Do not go swimming alone. Do not lock yourself in a room alone (i.e. bathroom). When caring for infants or small children, sit down when holding, feeding, or changing them to minimize risk of injury to the child in the event you have a seizure. Maintain good sleep hygiene. Avoid alcohol.   If you had Gastrointestinal Bleeding: Please ask your Primary MD to check a complete blood count within one week of discharge or at your next visit. Your endoscopic/colonoscopic biopsies that are pending at the time of discharge, will also need to followed by your Primary MD.  Get Medicines reviewed and adjusted. Please take all your medications with you for your next visit with your Primary MD  Please request your Primary MD to go over all hospital tests and procedure/radiological results at the follow up, please ask your Primary MD to get all Hospital records sent to his/her office.  If you experience worsening of your admission symptoms, develop shortness of breath, life threatening emergency, suicidal or homicidal thoughts you must seek medical attention immediately by calling 911 or calling your MD immediately  if symptoms less severe.  You must read complete instructions/literature along with all the possible adverse reactions/side effects for all the Medicines you take  and that have been prescribed to you. Take any new Medicines after you have completely understood and accpet all the possible adverse reactions/side effects.   Do not drive or operate heavy machinery when taking Pain medications.   Do not take more than prescribed Pain, Sleep and Anxiety Medications  Special Instructions: If you have smoked or chewed Tobacco  in the last 2 yrs please stop smoking, stop any regular  Alcohol  and or any Recreational drug use.  Wear Seat belts while driving.  Please note You were cared for by a hospitalist during your hospital stay. If you have any questions about your discharge medications or the care you received while you were in the hospital after you are discharged, you can call the unit and asked to speak with the hospitalist on call if the hospitalist that took care of you is not available. Once you are discharged, your primary care physician will handle any further medical issues. Please note that NO REFILLS for any discharge medications will be authorized once you are discharged, as it is imperative that you return to your primary care physician (or establish a relationship with a primary care physician if you do not have one) for your aftercare needs so that they can reassess your need for medications and monitor your lab values.  You can reach the hospitalist office at phone [phone] or fax [phone]   If you do not have a primary care physician, you can call [phone] for a physician referral.  Activity: As tolerated with Full fall precautions use walker/cane & assistance as needed    Diet: regular  Disposition Home   COVID-19: How to Protect Yourself and Others Know how it spreads  There is currently no vaccine to prevent coronavirus disease 2019 (COVID-19).  The best way to prevent illness is to avoid being exposed to this virus.  The virus is thought to spread mainly from person-to-person. ? Between people who are in close contact with one another (within about 6 feet). ? Through respiratory droplets produced when an infected person coughs, sneezes or talks. ? These droplets can land in the mouths or noses of people who are nearby or possibly be inhaled into the lungs. ? COVID-19 may be spread by people who are not showing symptoms. Everyone should Clean your hands often  Wash your hands often with soap and water for at least 20 seconds  especially after you have been in a public place, or after blowing your nose, coughing, or sneezing.  If soap and water are not readily available, use a hand sanitizer that contains at least 60% alcohol. Cover all surfaces of your hands and rub them together until they feel dry.  Avoid touching your eyes, nose, and mouth with unwashed hands. Avoid close contact  Limit contact with others as much as possible.  Avoid close contact with people who are sick.  Put distance between yourself and other people. ? Remember that some people without symptoms may be able to spread virus. ? This is especially important for people who are at higher risk of getting very RetroStamps.it Cover your mouth and nose with a mask when around others  You could spread COVID-19 to others even if you do not feel sick.  Everyone should wear a mask in public settings and when around people not living in their household, especially when social distancing is difficult to maintain. ? Masks should not be placed on young children under age 55, anyone who has trouble breathing, or  is unconscious, incapacitated or otherwise unable to remove the mask without assistance.  The mask is meant to protect other people in case you are infected.  Do NOT use a facemask meant for a Research scientist (physical sciences).  Continue to keep about 6 feet between yourself and others. The mask is not a substitute for social distancing. Cover coughs and sneezes  Always cover your mouth and nose with a tissue when you cough or sneeze or use the inside of your elbow.  Throw used tissues in the trash.  Immediately wash your hands with soap and water for at least 20 seconds. If soap and water are not readily available, clean your hands with a hand sanitizer that contains at least 60% alcohol. Clean and disinfect  Clean AND disinfect frequently touched surfaces daily. This includes  tables, doorknobs, light switches, countertops, handles, desks, phones, keyboards, toilets, faucets, and sinks. ktimeonline.com  If surfaces are dirty, clean them: Use detergent or soap and water prior to disinfection.  Then, use a household disinfectant. You can see a list of EPA-registered household disinfectants here. South[place]Flowers.co.uk 05/11/2019 This information is not intended to replace advice given to you by your health care provider. Make sure you discuss any questions you have with your health care provider. Document Revised: 05/19/2019 Document Reviewed: 03/17/2019 Elsevier Patient Education  2020 Elsevier Inc.   COVID-19 COVID-19 is a respiratory infection that is caused by a virus called severe acute respiratory syndrome coronavirus 2 (SARS-CoV-2). The disease is also known as coronavirus disease or novel coronavirus. In some people, the virus may not cause any symptoms. In others, it may cause a serious infection. The infection can get worse quickly and can lead to complications, such as:  Pneumonia, or infection of the lungs.  Acute respiratory distress syndrome or ARDS. This is a condition in which fluid build-up in the lungs prevents the lungs from filling with air and passing oxygen into the blood.  Acute respiratory failure. This is a condition in which there is not enough oxygen passing from the lungs to the body or when carbon dioxide is not passing from the lungs out of the body.  Sepsis or septic shock. This is a serious bodily reaction to an infection.  Blood clotting problems.  Secondary infections due to bacteria or fungus.  Organ failure. This is when your body's organs stop working. The virus that causes COVID-19 is contagious. This means that it can spread from person to person through droplets from coughs and sneezes (respiratory secretions). What are the causes? This illness is caused by  a virus. You may catch the virus by:  Breathing in droplets from an infected person. Droplets can be spread by a person breathing, speaking, singing, coughing, or sneezing.  Touching something, like a table or a doorknob, that was exposed to the virus (contaminated) and then touching your mouth, nose, or eyes. What increases the risk? Risk for infection You are more likely to be infected with this virus if you:  Are within 6 feet (2 meters) of a person with COVID-19.  Provide care for or live with a person who is infected with COVID-19.  Spend time in crowded indoor spaces or live in shared housing. Risk for serious illness You are more likely to become seriously ill from the virus if you:  Are 40 years of age or older. The higher your age, the more you are at risk for serious illness.  Live in a nursing home or long-term care facility.  Have cancer.  Have a long-term (chronic) disease such as: ? Chronic lung disease, including chronic obstructive pulmonary disease or asthma. ? A long-term disease that lowers your body's ability to fight infection (immunocompromised). ? Heart disease, including heart failure, a condition in which the arteries that lead to the heart become narrow or blocked (coronary artery disease), a disease which makes the heart muscle thick, weak, or stiff (cardiomyopathy). ? Diabetes. ? Chronic kidney disease. ? Sickle cell disease, a condition in which red blood cells have an abnormal "sickle" shape. ? Liver disease.  Are obese. What are the signs or symptoms? Symptoms of this condition can range from mild to severe. Symptoms may appear any time from 2 to 14 days after being exposed to the virus. They include:  A fever or chills.  A cough.  Difficulty breathing.  Headaches, body aches, or muscle aches.  Runny or stuffy (congested) nose.  A sore throat.  New loss of taste or smell. Some people may also have stomach problems, such as nausea,  vomiting, or diarrhea. Other people may not have any symptoms of COVID-19. How is this diagnosed? This condition may be diagnosed based on:  Your signs and symptoms, especially if: ? You live in an area with a COVID-19 outbreak. ? You recently traveled to or from an area where the virus is common. ? You provide care for or live with a person who was diagnosed with COVID-19. ? You were exposed to a person who was diagnosed with COVID-19.  A physical exam.  Lab tests, which may include: ? Taking a sample of fluid from the back of your nose and throat (nasopharyngeal fluid), your nose, or your throat using a swab. ? A sample of mucus from your lungs (sputum). ? Blood tests.  Imaging tests, which may include, X-rays, CT scan, or ultrasound. How is this treated? At present, there is no medicine to treat COVID-19. Medicines that treat other diseases are being used on a trial basis to see if they are effective against COVID-19. Your health care provider will talk with you about ways to treat your symptoms. For most people, the infection is mild and can be managed at home with rest, fluids, and over-the-counter medicines. Treatment for a serious infection usually takes places in a hospital intensive care unit (ICU). It may include one or more of the following treatments. These treatments are given until your symptoms improve.  Receiving fluids and medicines through an IV.  Supplemental oxygen. Extra oxygen is given through a tube in the nose, a face mask, or a hood.  Positioning you to lie on your stomach (prone position). This makes it easier for oxygen to get into the lungs.  Continuous positive airway pressure (CPAP) or bi-level positive airway pressure (BPAP) machine. This treatment uses mild air pressure to keep the airways open. A tube that is connected to a motor delivers oxygen to the body.  Ventilator. This treatment moves air into and out of the lungs by using a tube that is placed  in your windpipe.  Tracheostomy. This is a procedure to create a hole in the neck so that a breathing tube can be inserted.  Extracorporeal membrane oxygenation (ECMO). This procedure gives the lungs a chance to recover by taking over the functions of the heart and lungs. It supplies oxygen to the body and removes carbon dioxide. Follow these instructions at home: Lifestyle  If you are sick, stay home except to get medical care. Your health care provider will tell you  how long to stay home. Call your health care provider before you go for medical care.  Rest at home as told by your health care provider.  Do not use any products that contain nicotine or tobacco, such as cigarettes, e-cigarettes, and chewing tobacco. If you need help quitting, ask your health care provider.  Return to your normal activities as told by your health care provider. Ask your health care provider what activities are safe for you. General instructions  Take over-the-counter and prescription medicines only as told by your health care provider.  Drink enough fluid to keep your urine pale yellow.  Keep all follow-up visits as told by your health care provider. This is important. How is this prevented?  There is no vaccine to help prevent COVID-19 infection. However, there are steps you can take to protect yourself and others from this virus. To protect yourself:   Do not travel to areas where COVID-19 is a risk. The areas where COVID-19 is reported change often. To identify high-risk areas and travel restrictions, check the CDC travel website: StageSync.si  If you live in, or must travel to, an area where COVID-19 is a risk, take precautions to avoid infection. ? Stay away from people who are sick. ? Wash your hands often with soap and water for 20 seconds. If soap and water are not available, use an alcohol-based hand sanitizer. ? Avoid touching your mouth, face, eyes, or nose. ? Avoid going out  in public, follow guidance from your state and local health authorities. ? If you must go out in public, wear a cloth face covering or face mask. Make sure your mask covers your nose and mouth. ? Avoid crowded indoor spaces. Stay at least 6 feet (2 meters) away from others. ? Disinfect objects and surfaces that are frequently touched every day. This may include:  Counters and tables.  Doorknobs and light switches.  Sinks and faucets.  Electronics, such as phones, remote controls, keyboards, computers, and tablets. To protect others: If you have symptoms of COVID-19, take steps to prevent the virus from spreading to others.  If you think you have a COVID-19 infection, contact your health care provider right away. Tell your health care team that you think you may have a COVID-19 infection.  Stay home. Leave your house only to seek medical care. Do not use public transport.  Do not travel while you are sick.  Wash your hands often with soap and water for 20 seconds. If soap and water are not available, use alcohol-based hand sanitizer.  Stay away from other members of your household. Let healthy household members care for children and pets, if possible. If you have to care for children or pets, wash your hands often and wear a mask. If possible, stay in your own room, separate from others. Use a different bathroom.  Make sure that all people in your household wash their hands well and often.  Cough or sneeze into a tissue or your sleeve or elbow. Do not cough or sneeze into your hand or into the air.  Wear a cloth face covering or face mask. Make sure your mask covers your nose and mouth. Where to find more information  Centers for Disease Control and Prevention: StickerEmporium.tn  World Health Organization: https://thompson-craig.com/ Contact a health care provider if:  You live in or have traveled to an area where COVID-19 is a risk and you  have symptoms of the infection.  You have had contact with someone who  has COVID-19 and you have symptoms of the infection. Get help right away if:  You have trouble breathing.  You have pain or pressure in your chest.  You have confusion.  You have bluish lips and fingernails.  You have difficulty waking from sleep.  You have symptoms that get worse. These symptoms may represent a serious problem that is an emergency. Do not wait to see if the symptoms will go away. Get medical help right away. Call your local emergency services (911 in the [place] Do not drive yourself to the hospital. Let the emergency medical personnel know if you think you have COVID-19. Summary  COVID-19 is a respiratory infection that is caused by a virus. It is also known as coronavirus disease or novel coronavirus. It can cause serious infections, such as pneumonia, acute respiratory distress syndrome, acute respiratory failure, or sepsis.  The virus that causes COVID-19 is contagious. This means that it can spread from person to person through droplets from breathing, speaking, singing, coughing, or sneezing.  You are more likely to develop a serious illness if you are 19 years of age or older, have a weak immune system, live in a nursing home, or have chronic disease.  There is no medicine to treat COVID-19. Your health care provider will talk with you about ways to treat your symptoms.  Take steps to protect yourself and others from infection. Wash your hands often and disinfect objects and surfaces that are frequently touched every day. Stay away from people who are sick and wear a mask if you are sick. This information is not intended to replace advice given to you by your health care provider. Make sure you discuss any questions you have with your health care provider. Document Revised: 06/24/2019 Document Reviewed: 09/30/2018 Elsevier Patient Education  2020 [employer].   COVID-19 Frequently Asked  Questions COVID-19 (coronavirus disease) is an infection that is caused by a large family of viruses. Some viruses cause illness in people and others cause illness in animals like camels, cats, and bats. In some cases, the viruses that cause illness in animals can spread to humans. Where did the coronavirus come from? In December 2019, [place] told the [employer] [facility]) of several cases of lung disease (human respiratory illness). These cases were linked to an open seafood and livestock market in the city of [place]. The link to the seafood and livestock market suggests that the virus may have spread from animals to humans. However, since that first outbreak in December, the virus has also been shown to spread from person to person. What is the name of the disease and the virus? Disease name Early on, this disease was called novel coronavirus. This is because scientists determined that the disease was caused by a new (novel) respiratory virus. The World Health Organization [facility]) has now named the disease COVID-19, or coronavirus disease. Virus name The virus that causes the disease is called severe acute respiratory syndrome coronavirus 2 (SARS-CoV-2). More information on disease and virus naming World Health Organization [facility]): www.who.int/emergencies/diseases/novel-coronavirus-2019/technical-guidance/naming-the-coronavirus-disease-(covid-2019)-and-the-virus-that-causes-it Who is at risk for complications from coronavirus disease? Some people may be at higher risk for complications from coronavirus disease. This includes older adults and people who have chronic diseases, such as heart disease, diabetes, and lung disease. If you are at higher risk for complications, take these extra precautions:  Stay home as much as possible.  Avoid social gatherings and travel.  Avoid close contact with others. Stay at least 6 ft (2 m)  away from others, if possible.  Wash your hands often with soap  and water for at least 20 seconds.  Avoid touching your face, mouth, nose, or eyes.  Keep supplies on hand at home, such as food, medicine, and cleaning supplies.  If you must go out in public, wear a cloth face covering or face mask. Make sure your mask covers your nose and mouth. How does coronavirus disease spread? The virus that causes coronavirus disease spreads easily from person to person (is contagious). You may catch the virus by:  Breathing in droplets from an infected person. Droplets can be spread by a person breathing, speaking, singing, coughing, or sneezing.  Touching something, like a table or a doorknob, that was exposed to the virus (contaminated) and then touching your mouth, nose, or eyes. Can I get the virus from touching surfaces or objects? There is still a lot that we do not know about the virus that causes coronavirus disease. Scientists are basing a lot of information on what they know about similar viruses, such as:  Viruses cannot generally survive on surfaces for long. They need a human body (host) to survive.  It is more likely that the virus is spread by close contact with people who are sick (direct contact), such as through: ? Shaking hands or hugging. ? Breathing in respiratory droplets that travel through the air. Droplets can be spread by a person breathing, speaking, singing, coughing, or sneezing.  It is less likely that the virus is spread when a person touches a surface or object that has the virus on it (indirect contact). The virus may be able to enter the body if the person touches a surface or object and then touches his or her face, eyes, nose, or mouth. Can a person spread the virus without having symptoms of the disease? It may be possible for the virus to spread before a person has symptoms of the disease, but this is most likely not the main way the virus is spreading. It is more likely for the virus to spread by being in close contact with  people who are sick and breathing in the respiratory droplets spread by a person breathing, speaking, singing, coughing, or sneezing. What are the symptoms of coronavirus disease? Symptoms vary from person to person and can range from mild to severe. Symptoms may include:  Fever or chills.  Cough.  Difficulty breathing or feeling short of breath.  Headaches, body aches, or muscle aches.  Runny or stuffy (congested) nose.  Sore throat.  New loss of taste or smell.  Nausea, vomiting, or diarrhea. These symptoms can appear anywhere from 2 to 14 days after you have been exposed to the virus. Some people may not have any symptoms. If you develop symptoms, call your health care provider. People with severe symptoms may need hospital care. Should I be tested for this virus? Your health care provider will decide whether to test you based on your symptoms, history of exposure, and your risk factors. How does a health care provider test for this virus? Health care providers will collect samples to send for testing. Samples may include:  Taking a swab of fluid from the back of your nose and throat, your nose, or your throat.  Taking fluid from the lungs by having you cough up mucus (sputum) into a sterile cup.  Taking a blood sample. Is there a treatment or vaccine for this virus? Currently, there is no vaccine to prevent coronavirus disease. Also, there  are no medicines like antibiotics or antivirals to treat the virus. A person who becomes sick is given supportive care, which means rest and fluids. A person may also relieve his or her symptoms by using over-the-counter medicines that treat sneezing, coughing, and runny nose. These are the same medicines that a person takes for the common cold. If you develop symptoms, call your health care provider. People with severe symptoms may need hospital care. What can I do to protect myself and my family from this virus?     You can protect  yourself and your family by taking the same actions that you would take to prevent the spread of other viruses. Take the following actions:  Wash your hands often with soap and water for at least 20 seconds. If soap and water are not available, use alcohol-based hand sanitizer.  Avoid touching your face, mouth, nose, or eyes.  Cough or sneeze into a tissue, sleeve, or elbow. Do not cough or sneeze into your hand or the air. ? If you cough or sneeze into a tissue, throw it away immediately and wash your hands.  Disinfect objects and surfaces that you frequently touch every day.  Stay away from people who are sick.  Avoid going out in public, follow guidance from your state and local health authorities.  Avoid crowded indoor spaces. Stay at least 6 ft (2 m) away from others.  If you must go out in public, wear a cloth face covering or face mask. Make sure your mask covers your nose and mouth.  Stay home if you are sick, except to get medical care. Call your health care provider before you get medical care. Your health care provider will tell you how long to stay home.  Make sure your vaccines are up to date. Ask your health care provider what vaccines you need. What should I do if I need to travel? Follow travel recommendations from your local health authority, the CDC, and WHO. Travel information and advice  Centers for Disease Control and Prevention (CDC): GeminiCard.gl  World Health Organization [facility]): PreviewDomains.se Know the risks and take action to protect your health  You are at higher risk of getting coronavirus disease if you are traveling to areas with an outbreak or if you are exposed to travelers from areas with an outbreak.  Wash your hands often and practice good hygiene to lower the risk of catching or spreading the virus. What should I do if I am sick? General instructions  to stop the spread of infection  Wash your hands often with soap and water for at least 20 seconds. If soap and water are not available, use alcohol-based hand sanitizer.  Cough or sneeze into a tissue, sleeve, or elbow. Do not cough or sneeze into your hand or the air.  If you cough or sneeze into a tissue, throw it away immediately and wash your hands.  Stay home unless you must get medical care. Call your health care provider or local health authority before you get medical care.  Avoid public areas. Do not take public transportation, if possible.  If you can, wear a mask if you must go out of the house or if you are in close contact with someone who is not sick. Make sure your mask covers your nose and mouth. Keep your home clean  Disinfect objects and surfaces that are frequently touched every day. This may include: ? Counters and tables. ? Doorknobs and light switches. ? Sinks and faucets. ?  Electronics such as phones, remote controls, keyboards, computers, and tablets.  Wash dishes in hot, soapy water or use a dishwasher. Air-dry your dishes.  Wash laundry in hot water. Prevent infecting other household members  Let healthy household members care for children and pets, if possible. If you have to care for children or pets, wash your hands often and wear a mask.  Sleep in a different bedroom or bed, if possible.  Do not share personal items, such as razors, toothbrushes, deodorant, combs, brushes, towels, and washcloths. Where to find more information Centers for Disease Control and Prevention (CDC)  Information and news updates: https://www.butler-gonzalez.com/ World Health Organization [facility])  Information and news updates: MissExecutive.com.ee  Coronavirus health topic: https://www.castaneda.info/  Questions and answers on COVID-19: OpportunityDebt.at  Global tracker:  who.sprinklr.com American Academy of Pediatrics (AAP)  Information for families: www.healthychildren.org/English/health-issues/conditions/chest-lungs/Pages/2019-Novel-Coronavirus.aspx The coronavirus situation is changing rapidly. Check your local health authority website or the CDC and [facility] websites for updates and news. When should I contact a health care provider?  Contact your health care provider if you have symptoms of an infection, such as fever or cough, and you: ? Have been near anyone who is known to have coronavirus disease. ? Have come into contact with a person who is suspected to have coronavirus disease. ? Have traveled to an area where there is an outbreak of COVID-19. When should I get emergency medical care?  Get help right away by calling your local emergency services (911 in the [place] if you have: ? Trouble breathing. ? Pain or pressure in your chest. ? Confusion. ? Blue-tinged lips and fingernails. ? Difficulty waking from sleep. ? Symptoms that get worse. Let the emergency medical personnel know if you think you have coronavirus disease. Summary  A new respiratory virus is spreading from person to person and causing COVID-19 (coronavirus disease).  The virus that causes COVID-19 appears to spread easily. It spreads from one person to another through droplets from breathing, speaking, singing, coughing, or sneezing.  Older adults and those with chronic diseases are at higher risk of disease. If you are at higher risk for complications, take extra precautions.  There is currently no vaccine to prevent coronavirus disease. There are no medicines, such as antibiotics or antivirals, to treat the virus.  You can protect yourself and your family by washing your hands often, avoiding touching your face, and covering your coughs and sneezes. This information is not intended to replace advice given to you by your health care provider. Make sure you discuss any questions you  have with your health care provider. Document Revised: 06/24/2019 Document Reviewed: 12/21/2018 Elsevier Patient Education  [place].

## 2019-10-17 NOTE — TOC Transition Note (Signed)
Transition of Care [facility] - CM/SW Discharge Note   Patient Details  Name: Rolen Conger MRN: [id number] Date of Birth: March 11, 1946  Transition of Care [facility]) CM/SW Contact:  Armanda Heritage, RN Phone Number: 10/17/2019, 11:41 AM   Clinical Narrative:    Patient will be transported home by [facility], transportation has been arranged.  [employer] notified to deliver rolling walker and 3-in-1 to home, [facility] is unable to transport equipment.     Final next level of care: Home w Home Health Services Barriers to Discharge: No Barriers Identified   Patient Goals and CMS Choice   CMS Medicare.gov Compare Post Acute Care list provided to:: Patient Choice offered to / list presented to : Patient  Discharge Placement                       Discharge Plan and Services   Discharge Planning Services: CM Consult Post Acute Care Choice: Home Health, Durable Medical Equipment          DME Arranged: 3-N-1, Walker rolling DME Agency: [facility] Date DME Agency Contacted: 10/14/19 Time DME Agency Contacted: [phone] Representative spoke with at DME Agency: Fayrene Fearing HH Arranged: PT, OT [facility] Agency: [facility] Date [facility] Agency Contacted: 10/14/19 Time HH Agency Contacted: [phone] Representative spoke with at [facility] Agency: Lorenza Chick  Social Determinants of Health (SDOH) Interventions     Readmission Risk Interventions No flowsheet data found.

## 2019-10-17 NOTE — Progress Notes (Signed)
1300Pt discharged in care of [facility].  Pt to be taken home went over Medication Decadron and Metformin with pt and his daughter [name]. I explained pt had prescriptions for each and daughter requests that we send them to Pharmacy directly. She states she is dealing with her mom who just fell and broke her hip.

## 2019-10-17 NOTE — Care Management Important Message (Signed)
Important Message  Patient Details  Name: Aaron Mendez MRN: [id number] Date of Birth: 1946/06/06   Medicare Important Message Given:  Yes - Important Message mailed due to current National Emergency  Verbal consent obtained due to current National Emergency  Relationship to patient: Child Contact Name: Earnestine Leys Call Date: 10/17/19  Time: 1026 Phone: [phone] Outcome: Spoke with contact Important Message mailed to: Emergency contact on file    Orson Aloe 10/17/2019, 10:27 AM

## 2019-10-17 NOTE — Progress Notes (Signed)
0935Placed call to pt daughter Ms Mclaughin.  Left detailed message that pt is to be discharged this am.

## 2019-10-18 ENCOUNTER — Telehealth: Payer: Self-pay | Admitting: *Deleted

## 2019-10-18 NOTE — Progress Notes (Signed)
CM received call from patient's daughter stating she was concerned that her dad was alone at home and she felt he needed some increased monitoring after being discharge from the hospital.  Daughter expressed that she felt he would be best served in SNF.  CM discussed that patient is alert and oriented and able to make own decisions and he wished to return home and was set up with [facility] services at discharge.  CM acknowledged daughters concerns about need for increased monitoring. CM placed a referral for the hospital to home program and patient was accepted.  CM updated daughter, [name], regarding this.

## 2019-10-18 NOTE — Telephone Encounter (Signed)
Transition Care Management Follow-up Telephone Call   Date discharged? 10/17/19   How have you been since you were released from the hospital? "Doing well"    Do you understand why you were in the hospital? yes   Do you understand the discharge instructions? yes   Where were you discharged to? home   Items Reviewed:  Medications reviewed: "they just added a steroid"  Allergies reviewed: yes  Dietary changes reviewed: yes  Referrals reviewed: yes   Functional Questionnaire:   Activities of Daily Living (ADLs):   He states they are independent in the following: ambulation, bathing and hygiene, feeding, continence, grooming, toileting and dressing States they require assistance with the following: na uses cane and walker as needed.   Any transportation issues/concerns?: no   Any patient concerns? no   Confirmed importance and date/time of follow-up visits scheduled yes  Provider Appointment booked with Dr.Kremer 10/19/19  Confirmed with patient if condition begins to worsen call PCP or go to the ER.  Patient was given the office number and encouraged to call back with question or concerns.  : yes

## 2019-10-19 ENCOUNTER — Encounter: Payer: Self-pay | Admitting: Family Medicine

## 2019-10-19 ENCOUNTER — Encounter: Payer: Medicare PPO | Admitting: Family Medicine

## 2019-10-21 LAB — CULTURE, BLOOD (ROUTINE X 2)
Culture: NO GROWTH
Culture: NO GROWTH
Special Requests: ADEQUATE
Special Requests: ADEQUATE

## 2019-10-28 ENCOUNTER — Inpatient Hospital Stay (HOSPITAL_COMMUNITY)
Admission: EM | Admit: 2019-10-28 | Discharge: 2019-10-31 | DRG: 064 | Disposition: A | Discharge status: Home Health | Payer: Medicare PPO | Attending: Internal Medicine | Admitting: Internal Medicine

## 2019-10-28 ENCOUNTER — Emergency Department (HOSPITAL_COMMUNITY): Payer: Medicare PPO

## 2019-10-28 ENCOUNTER — Encounter (HOSPITAL_COMMUNITY): Payer: Self-pay | Admitting: Emergency Medicine

## 2019-10-28 DIAGNOSIS — Z7984 Long term (current) use of oral hypoglycemic drugs: Secondary | ICD-10-CM

## 2019-10-28 DIAGNOSIS — R4781 Slurred speech: Secondary | ICD-10-CM | POA: Diagnosis present

## 2019-10-28 DIAGNOSIS — Z8616 Personal history of COVID-19: Secondary | ICD-10-CM

## 2019-10-28 DIAGNOSIS — M170 Bilateral primary osteoarthritis of knee: Secondary | ICD-10-CM | POA: Diagnosis present

## 2019-10-28 DIAGNOSIS — E785 Hyperlipidemia, unspecified: Secondary | ICD-10-CM

## 2019-10-28 DIAGNOSIS — I639 Cerebral infarction, unspecified: Secondary | ICD-10-CM | POA: Diagnosis not present

## 2019-10-28 DIAGNOSIS — Z66 Do not resuscitate: Secondary | ICD-10-CM | POA: Diagnosis present

## 2019-10-28 DIAGNOSIS — G9341 Metabolic encephalopathy: Secondary | ICD-10-CM | POA: Diagnosis present

## 2019-10-28 DIAGNOSIS — R29701 NIHSS score 1: Secondary | ICD-10-CM | POA: Diagnosis present

## 2019-10-28 DIAGNOSIS — R270 Ataxia, unspecified: Secondary | ICD-10-CM | POA: Diagnosis present

## 2019-10-28 DIAGNOSIS — H409 Unspecified glaucoma: Secondary | ICD-10-CM | POA: Diagnosis present

## 2019-10-28 DIAGNOSIS — N390 Urinary tract infection, site not specified: Secondary | ICD-10-CM | POA: Diagnosis present

## 2019-10-28 DIAGNOSIS — A4150 Gram-negative sepsis, unspecified: Secondary | ICD-10-CM | POA: Diagnosis present

## 2019-10-28 DIAGNOSIS — R739 Hyperglycemia, unspecified: Secondary | ICD-10-CM | POA: Diagnosis not present

## 2019-10-28 DIAGNOSIS — Z832 Family history of diseases of the blood and blood-forming organs and certain disorders involving the immune mechanism: Secondary | ICD-10-CM

## 2019-10-28 DIAGNOSIS — N3000 Acute cystitis without hematuria: Secondary | ICD-10-CM

## 2019-10-28 DIAGNOSIS — E1165 Type 2 diabetes mellitus with hyperglycemia: Secondary | ICD-10-CM | POA: Diagnosis present

## 2019-10-28 DIAGNOSIS — E871 Hypo-osmolality and hyponatremia: Secondary | ICD-10-CM | POA: Diagnosis present

## 2019-10-28 DIAGNOSIS — D573 Sickle-cell trait: Secondary | ICD-10-CM | POA: Diagnosis present

## 2019-10-28 LAB — URINALYSIS, ROUTINE W REFLEX MICROSCOPIC
Bilirubin Urine: NEGATIVE
Glucose, UA: 150 mg/dL — AB
Hgb urine dipstick: NEGATIVE
Ketones, ur: NEGATIVE mg/dL
Nitrite: NEGATIVE
Protein, ur: NEGATIVE mg/dL
Specific Gravity, Urine: 1.011 (ref 1.005–1.030)
pH: 7 (ref 5.0–8.0)

## 2019-10-28 LAB — CBC WITH DIFFERENTIAL/PLATELET
Abs Immature Granulocytes: 0.1 10*3/uL — ABNORMAL HIGH (ref 0.00–0.07)
Basophils Absolute: 0 10*3/uL (ref 0.0–0.1)
Basophils Relative: 0 %
Eosinophils Absolute: 0 10*3/uL (ref 0.0–0.5)
Eosinophils Relative: 0 %
HCT: 44.1 % (ref 39.0–52.0)
Hemoglobin: 14.2 g/dL (ref 13.0–17.0)
Immature Granulocytes: 1 %
Lymphocytes Relative: 4 %
Lymphs Abs: 0.8 10*3/uL (ref 0.7–4.0)
MCH: 28 pg (ref 26.0–34.0)
MCHC: 32.2 g/dL (ref 30.0–36.0)
MCV: 86.8 fL (ref 80.0–100.0)
Monocytes Absolute: 1.7 10*3/uL — ABNORMAL HIGH (ref 0.1–1.0)
Monocytes Relative: 8 %
Neutro Abs: 18.6 10*3/uL — ABNORMAL HIGH (ref 1.7–7.7)
Neutrophils Relative %: 87 %
Platelets: 158 10*3/uL (ref 150–400)
RBC: 5.08 MIL/uL (ref 4.22–5.81)
RDW: 12.4 % (ref 11.5–15.5)
WBC: 21.3 10*3/uL — ABNORMAL HIGH (ref 4.0–10.5)
nRBC: 0 % (ref 0.0–0.2)

## 2019-10-28 LAB — COMPREHENSIVE METABOLIC PANEL
ALT: 24 U/L (ref 0–44)
AST: 19 U/L (ref 15–41)
Albumin: 3.2 g/dL — ABNORMAL LOW (ref 3.5–5.0)
Alkaline Phosphatase: 56 U/L (ref 38–126)
Anion gap: 10 (ref 5–15)
BUN: 7 mg/dL — ABNORMAL LOW (ref 8–23)
CO2: 24 mmol/L (ref 22–32)
Calcium: 8.3 mg/dL — ABNORMAL LOW (ref 8.9–10.3)
Chloride: 100 mmol/L (ref 98–111)
Creatinine, Ser: 1.08 mg/dL (ref 0.61–1.24)
GFR calc Af Amer: >60 mL/min (ref >60)
GFR calc non Af Amer: >60 mL/min (ref >60)
Glucose, Bld: 242 mg/dL — ABNORMAL HIGH (ref 70–99)
Potassium: 4.1 mmol/L (ref 3.5–5.1)
Sodium: 134 mmol/L — ABNORMAL LOW (ref 135–145)
Total Bilirubin: 0.8 mg/dL (ref 0.3–1.2)
Total Protein: 6.4 g/dL — ABNORMAL LOW (ref 6.5–8.1)

## 2019-10-28 LAB — CBG MONITORING, ED
Glucose-Capillary: 238 mg/dL — ABNORMAL HIGH (ref 70–99)
Glucose-Capillary: 204 mg/dL — ABNORMAL HIGH (ref 70–99)

## 2019-10-28 LAB — TROPONIN I (HIGH SENSITIVITY)
Troponin I (High Sensitivity): 16 ng/L (ref <18)
Troponin I (High Sensitivity): 23 ng/L — ABNORMAL HIGH (ref <18)
Troponin I (High Sensitivity): 20 ng/L — ABNORMAL HIGH (ref <18)

## 2019-10-28 IMAGING — DX DG CHEST 1V PORT
1 series · 1 of 1 positions shown · non-contrast
Comparison: [DATE]

CLINICAL DATA: Altered mental status.

EXAM:
PORTABLE CHEST 1 VIEW

[chest ap]
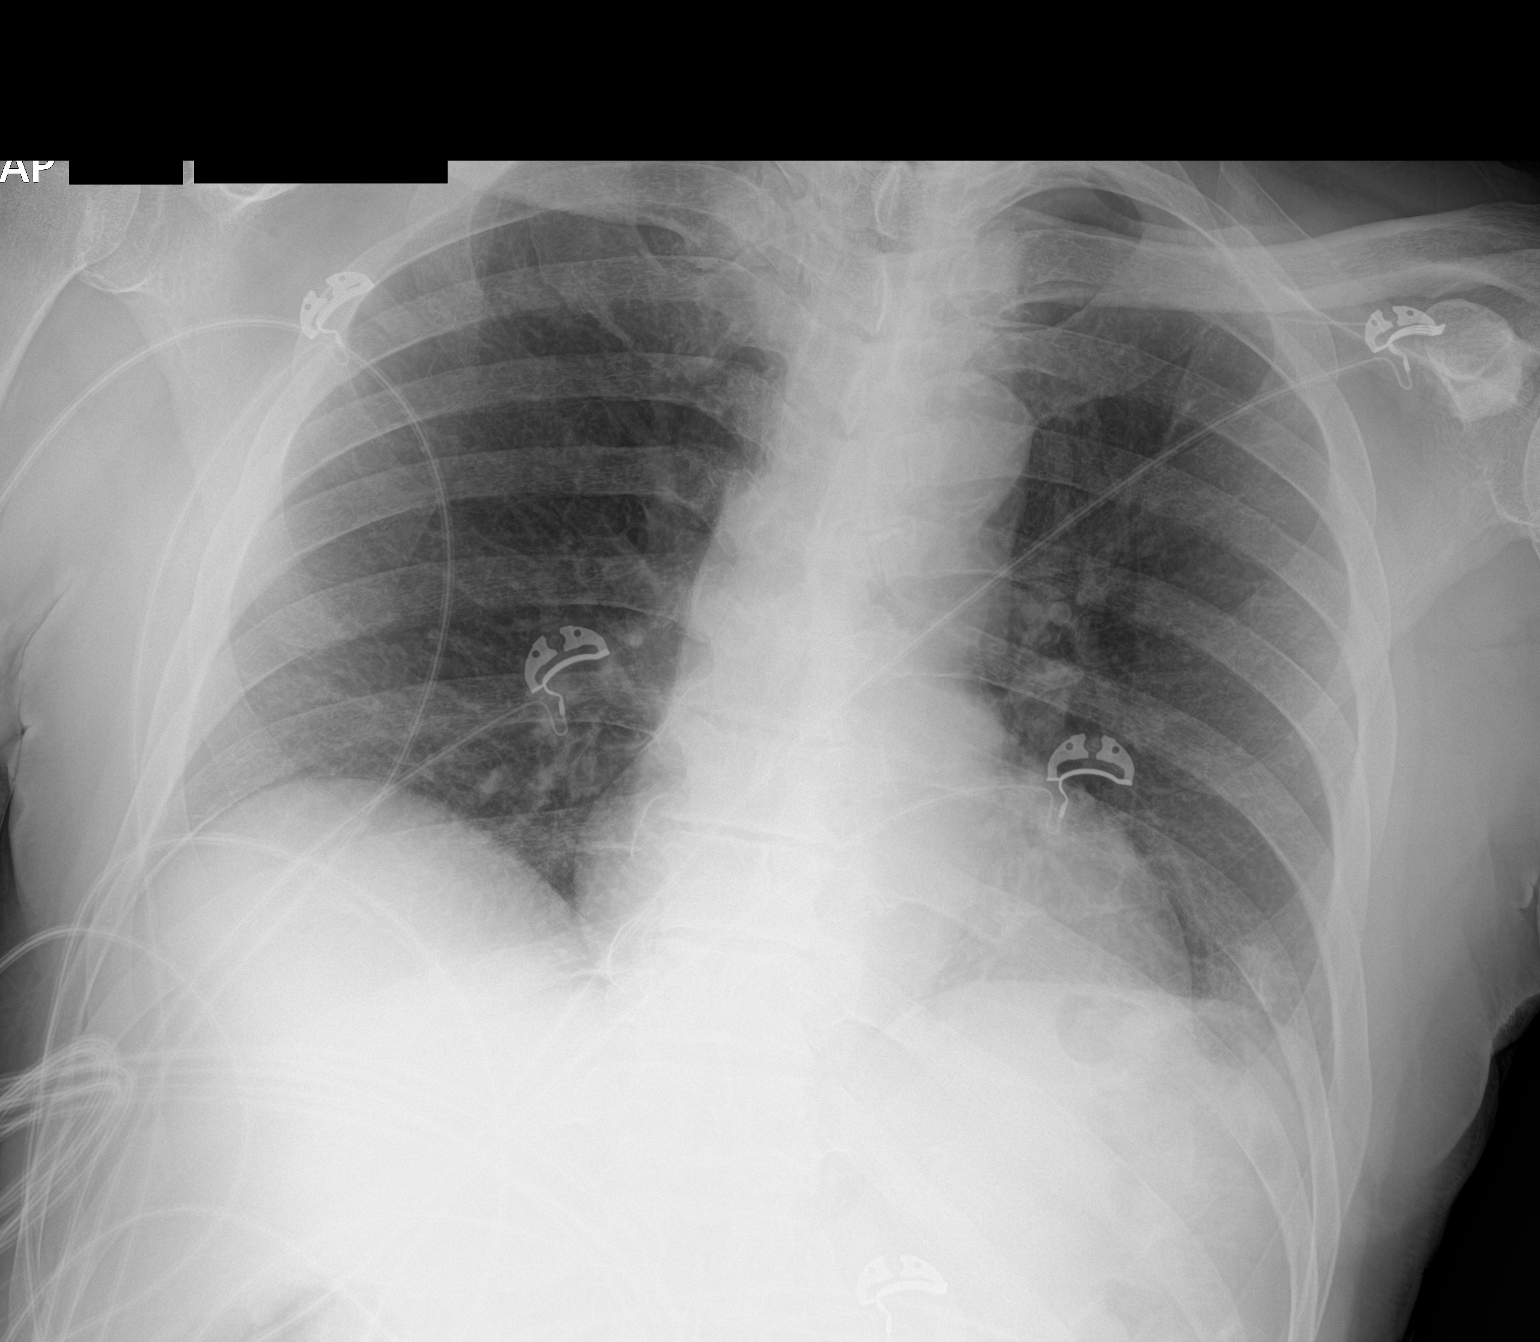

[1 of 1 positions shown; findings below may reference images not displayed]

FINDINGS: The cardiac silhouette, mediastinal and hilar contours are within
normal limits and stable. Slightly low lung volumes with mild
vascular crowding and streaky basilar atelectasis. No infiltrates,
edema or effusions. The bony thorax is intact.
IMPRESSION: Low lung volumes with vascular crowding and streaky basilar
atelectasis. No infiltrates or effusions.

## 2019-10-28 IMAGING — CT CT HEAD W/O CM
4 series · 16 of 47 positions shown, 18 images · non-contrast
Comparison: None.

CLINICAL DATA: Recent onset memory difficulty.

EXAM:
CT HEAD WITHOUT CONTRAST
TECHNIQUE: Contiguous axial images were obtained from the base of the skull
through the vertex without intravenous contrast.

[Series 3: head wo · axial · 0.45mm/px · z∈[-132,-7]mm · 7 of 35 slices shown, 9 images]
[im 5/35  brain]
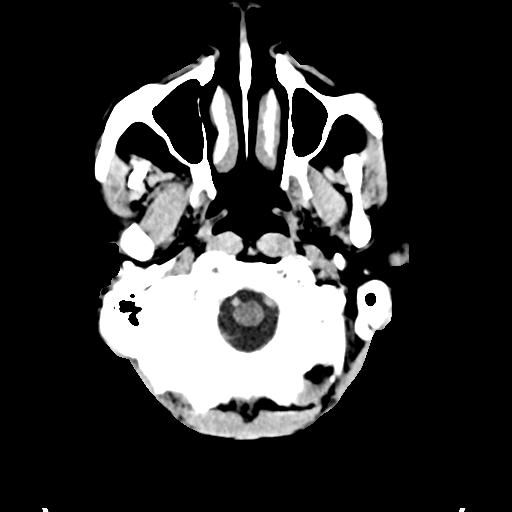
[im 5/35  bone]
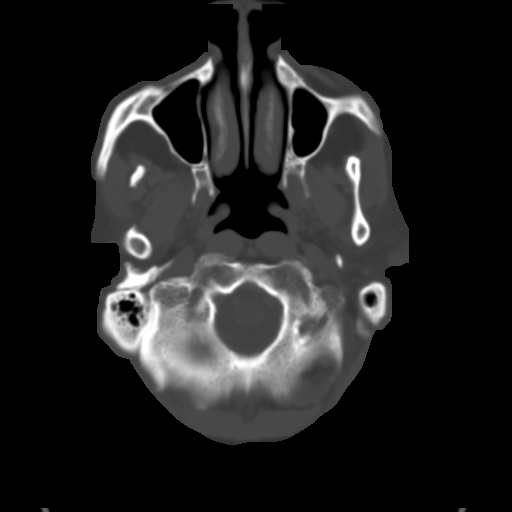
[im 9/35  brain]
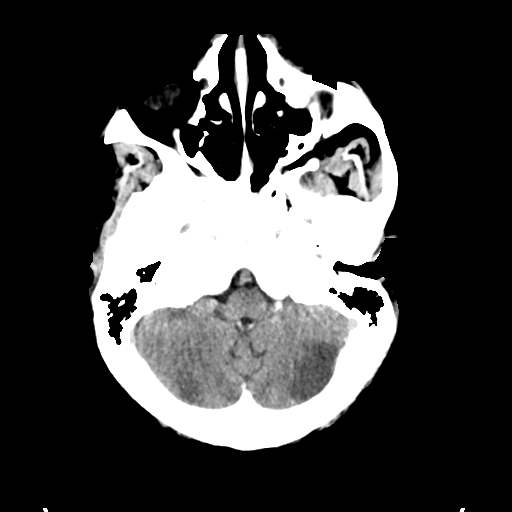
[im 13/35  brain]
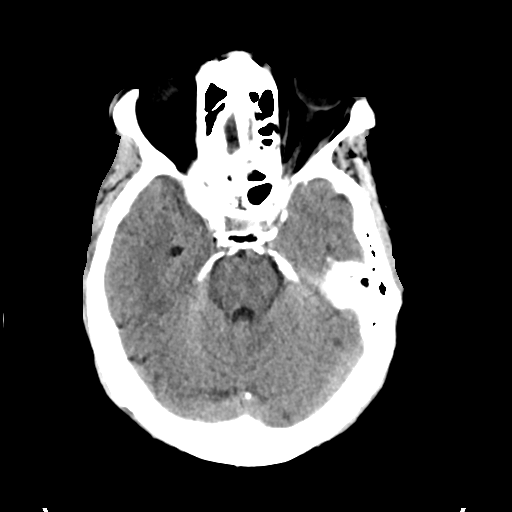
[im 18/35  brain]
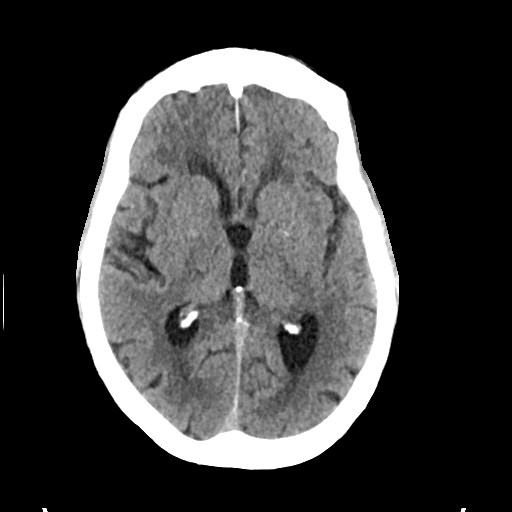
[im 22/35  brain]
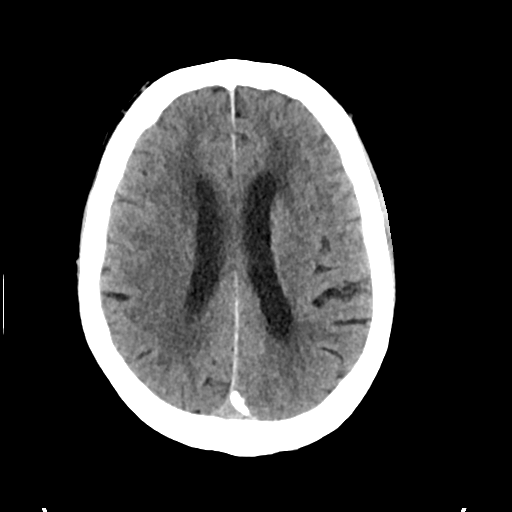
[im 22/35  bone]
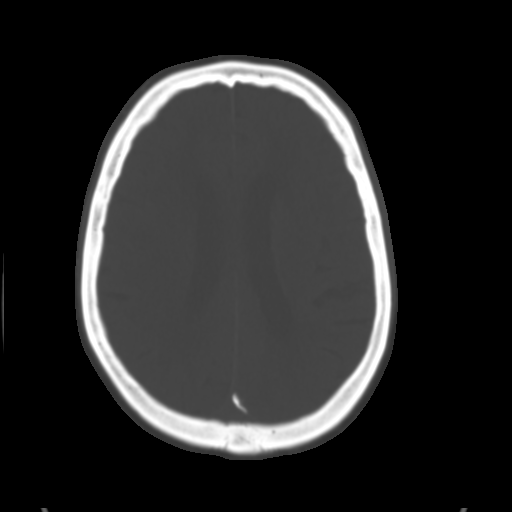
[im 26/35  brain]
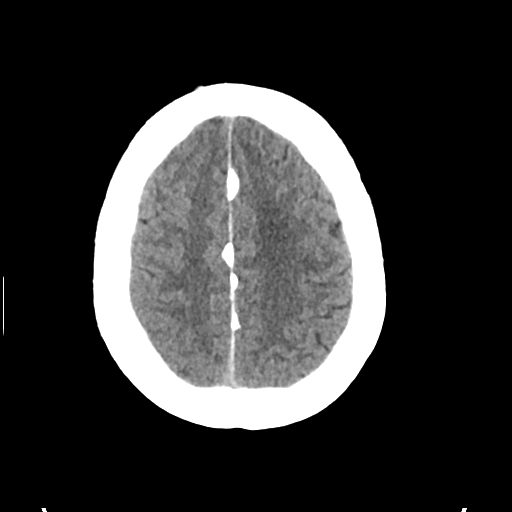
[im 30/35  brain]
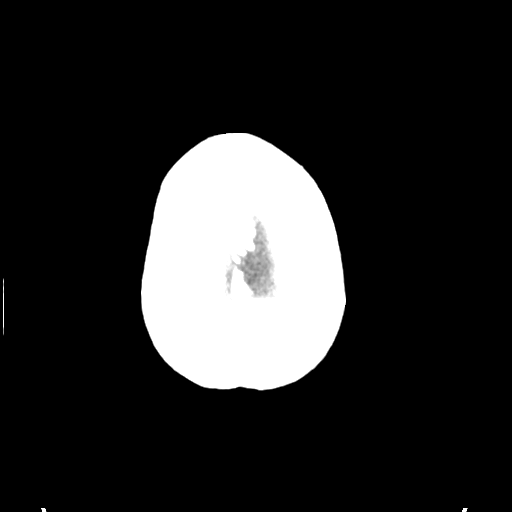

[Series 4: head bone · axial · 0.45mm/px · z∈[-136,-102]mm · 3 of 87 slices shown]
[im 9/87  bone]
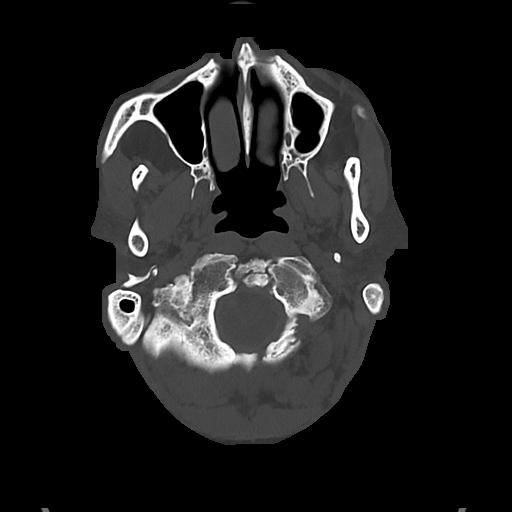
[im 18/87  bone]
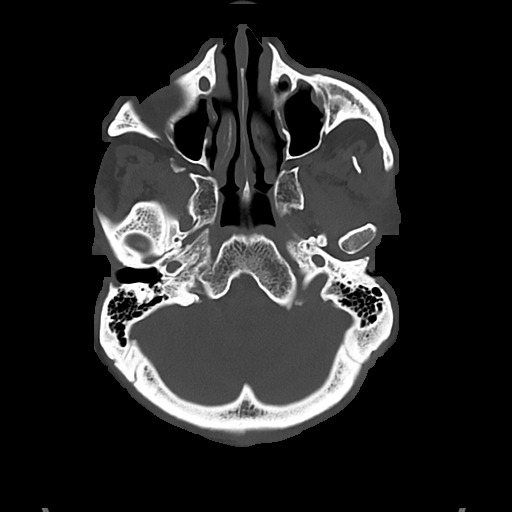
[im 26/87  bone]
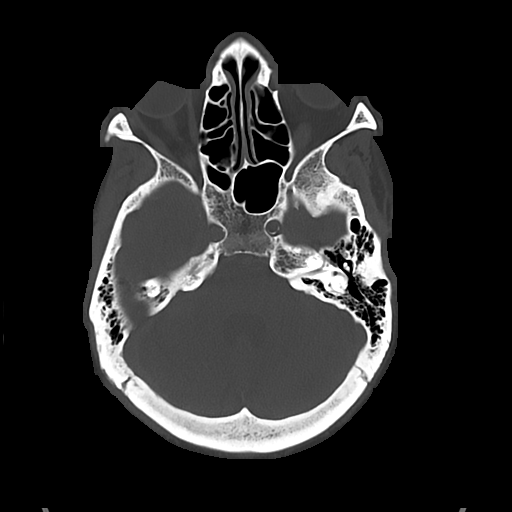

[Series 5: cor soft · coronal · 0.33mm/px · 3 of 71 slices shown]
[im 24/71  brain]
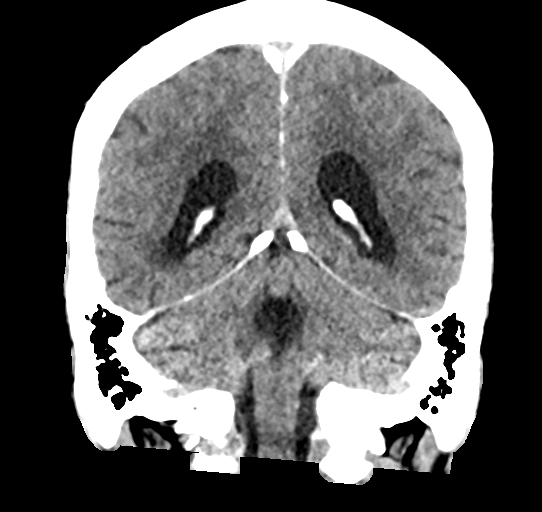
[im 32/71  brain]
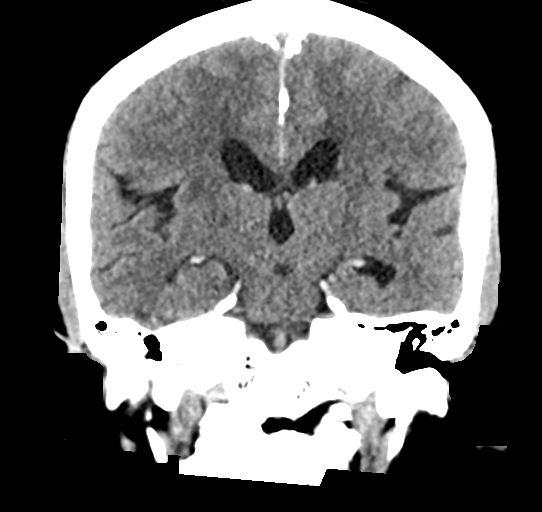
[im 39/71  brain]
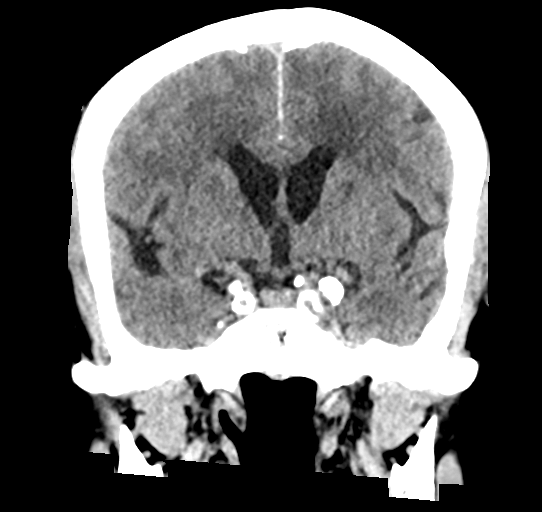

[Series 6: sag soft · sagittal · 0.33mm/px · 3 of 60 slices shown]
[im 20/60  brain]
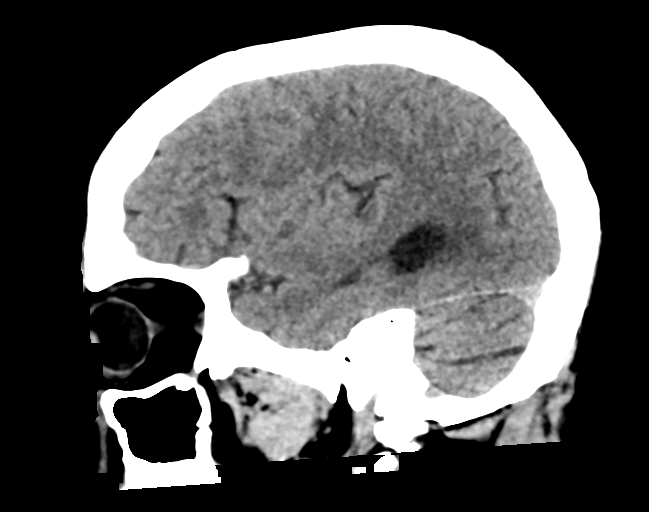
[im 30/60  brain]
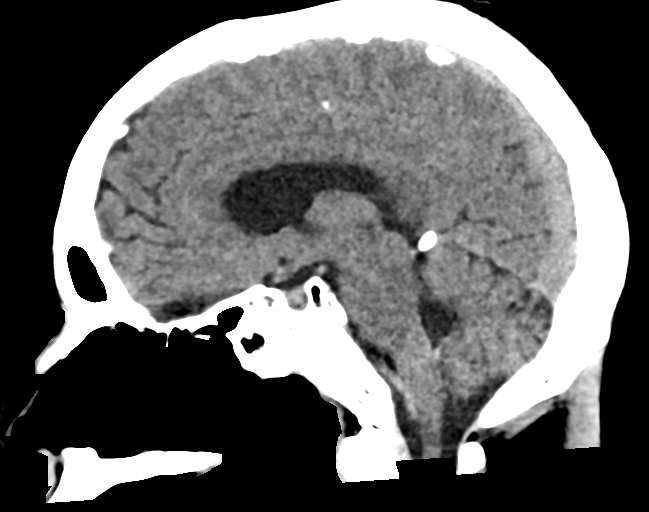
[im 40/60  brain]
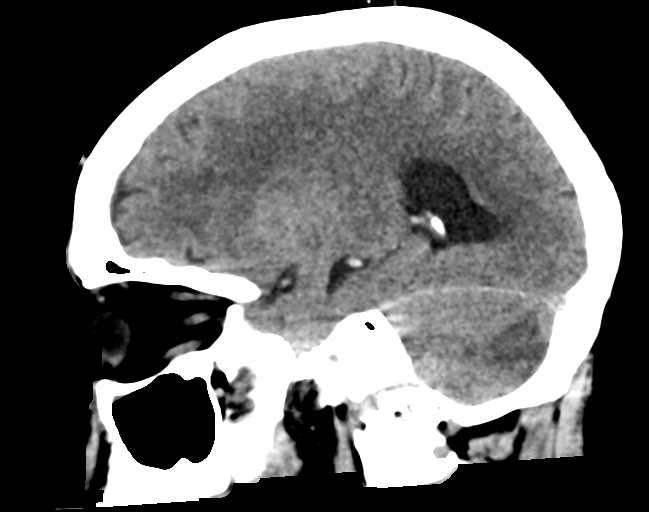

[16 of 47 positions shown; findings below may reference images not displayed]

FINDINGS: Brain: No evidence of acute infarction, hemorrhage, hydrocephalus,
extra-axial collection or mass lesion/mass effect. There is chronic
microvascular ischemic change of hypoattenuation seen in the
subcortical and periventricular deep white matter. Remote left
cerebellar infarct is noted.

Vascular: Extensive atherosclerosis.

Skull: Intact.  No focal lesion.

Sinuses/Orbits: Status post maxillary antrostomy. Otherwise
negative.

Other: None.
IMPRESSION: No acute intracranial abnormality.

Chronic microvascular ischemic change. Remote left cerebellar
infarct.

## 2019-10-28 MED ORDER — CEPHALEXIN 500 MG PO CAPS: 500.0000 mg | ORAL_CAPSULE | Freq: Three times a day (TID) | ORAL | 0 refills | Status: DC

## 2019-10-28 MED ORDER — SODIUM CHLORIDE 0.9 % IV BOLUS
1000.0000 mL | Freq: Once | INTRAVENOUS | Status: AC
  Administered 2019-10-28: 16:00:00 1000 mL via INTRAVENOUS

## 2019-10-28 MED ORDER — SODIUM CHLORIDE 0.9 % IV SOLN
1.0000 g | Freq: Once | INTRAVENOUS | Status: AC
  Administered 2019-10-28: 1 g via INTRAVENOUS
  Filled 2019-10-28: qty 10

## 2019-10-28 MED ORDER — METFORMIN HCL 500 MG PO TABS
500.0000 mg | ORAL_TABLET | Freq: Once | ORAL | Status: AC
  Administered 2019-10-28: 18:00:00 500 mg via ORAL
  Filled 2019-10-28: qty 1

## 2019-10-28 NOTE — Social Work (Addendum)
EDCSW met with Pt at bedside. CSW was able to work with Pt to gather information regarding phone number and address.  Pt expressed frustration with recent memory struggles. Pt reports that memory issues began just today. Pt remains pleasant and easy mannered.  TOC team will continue to investigate.

## 2019-10-28 NOTE — ED Notes (Signed)
Pt transported to CT ?

## 2019-10-28 NOTE — Care Management (Signed)
Spoke with Dr. Manus Gunning concerning patient transitional care needs. Dr. Manus Gunning spoke with daughter who states he has medications he just has not been taking them. Discussed patient was set up on discharge with [facility] but services have not been established, possible due to address not being listed in record. CSW verified patient address and correct phone number. CM will re-fax orders to [facility] to establish home health services no further ED CM needs identified.   Michel Bickers RN, BSN  ED Care Manager [phone]
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
## 2019-10-28 NOTE — ED Provider Notes (Addendum)
[facility] EMERGENCY DEPARTMENT Provider Note   CSN: [id number] Arrival date & time: 10/28/19  1518     History Chief Complaint  Patient presents with  . Hyperglycemia    Aaron Mendez is a 74 y.o. male.  Patient brought in by EMS with generalized weakness and hyperglycemia.  He was recently admitted for coronavirus pneumonia and discharged home on February 8 after being admitted February 2.  He was given Metformin for steroid-induced hyperglycemia.  He reports no history of diabetes.  He comes in today with generalized weakness and elevated blood sugar.  He states he feels weak all over and his blood sugar has been in the 2 and 300 range at home.  States he has not been taking any medications since he was discharged as he does not know what happened to them.  He brings in a discharge summary that still has prescriptions attached.  He denies any chest pain, abdominal pain, nausea, vomiting.  No focal weakness, numbness or tingling.  No fever.  No pain with urination or blood in the urine.  Denies a history of diabetes. He states he is living at home with a friend  Discussed with patient's daughter Aaron Mendez by phone.  She states patient has had confusion for the better part of the week.  Today EMS was called because he was "talking out of his head," slurring his words and walking around the house naked which is atypical for him.  Daughter concerned he has undiagnosed dementia.  She states she did fill his Metformin and Decadron which she has been taking..  The history is provided by the patient and the EMS personnel.  Hyperglycemia Associated symptoms: fatigue and weakness   Associated symptoms: no abdominal pain, no chest pain, no diaphoresis, no dizziness, no dysuria, no fever, no nausea, no shortness of breath and no vomiting        Past Medical History:  Diagnosis Date  . Bilateral primary osteoarthritis of knee   . Sickle cell trait [facility])     Patient Active Problem  List   Diagnosis Date Noted  . Fever due to COVID-19 10/12/2019  . Dehydration 10/12/2019  . Acute bilateral low back pain without sciatica 05/06/2019  . Poison ivy dermatitis 02/04/2019  . Viral URI with cough 06/29/2018  . Erectile dysfunction 06/29/2018  . Encounter for well adult exam with abnormal findings 12/21/2017  . Enlarged prostate on rectal examination 12/21/2017  . Unilateral primary osteoarthritis, left knee 12/21/2017    History reviewed. No pertinent surgical history.     Family History  Problem Relation Age of Onset  . Sickle cell anemia Son     Social History   Tobacco Use  . Smoking status: Never Smoker  . Smokeless tobacco: Never Used  Substance Use Topics  . Alcohol use: No  . Drug use: No    Home Medications Prior to Admission medications   Medication Sig Start Date End Date Taking? Authorizing Provider  atorvastatin (LIPITOR) 20 MG tablet TAKE 1 TABLET(20 MG) BY MOUTH DAILY Patient taking differently: Take 20 mg by mouth daily.  08/19/19   Everrett Coombe, DO  brimonidine (ALPHAGAN) 0.2 % ophthalmic solution Place 1 drop into both eyes 2 (two) times daily.  08/22/19   [provider]  dexamethasone (DECADRON) 6 MG tablet Take 1 tablet (6 mg total) by mouth daily. 10/17/19   Leatha Gilding, MD  ibuprofen (ADVIL) 800 MG tablet Take 800 mg by mouth 3 (three) times daily as  needed (pain.).  07/07/19   [provider]  metFORMIN (GLUCOPHAGE) 500 MG tablet Take 1 tablet (500 mg total) by mouth 2 (two) times daily with a meal. 10/17/19   Gherghe, Daylene Katayama, MD  Multiple Vitamin (MULTIVITAMIN WITH MINERALS) TABS tablet Take 1 tablet by mouth daily.    [provider]  naproxen (NAPROSYN) 500 MG tablet TAKE 1 TABLET(500 MG) BY MOUTH TWICE DAILY WITH A MEAL Patient taking differently: Take 500 mg by mouth 2 (two) times daily as needed (pain.).  10/04/19   Everrett Coombe, DO    Allergies    Patient has no known allergies.  Review of  Systems   Review of Systems  Constitutional: Positive for fatigue. Negative for activity change, appetite change, diaphoresis and fever.  HENT: Negative for congestion and rhinorrhea.   Respiratory: Negative for chest tightness and shortness of breath.   Cardiovascular: Negative for chest pain.  Gastrointestinal: Negative for abdominal pain, nausea and vomiting.  Genitourinary: Negative for dysuria and hematuria.  Musculoskeletal: Negative for arthralgias and myalgias.  Skin: Negative for rash.  Neurological: Positive for weakness. Negative for dizziness, light-headedness and headaches.   all other systems are negative except as noted in the HPI and PMH.    Physical Exam Updated Vital Signs BP 115/81 (BP Location: Right Arm)   Pulse 76   Temp 98.9 F (37.2 C) (Oral)   Resp (!) 24   SpO2 97%   Physical Exam Vitals and nursing note reviewed.  Constitutional:      General: He is not in acute distress.    Appearance: He is well-developed.  HENT:     Head: Normocephalic and atraumatic.     Mouth/Throat:     Pharynx: No oropharyngeal exudate.  Eyes:     Conjunctiva/sclera: Conjunctivae normal.     Pupils: Pupils are equal, round, and reactive to light.  Neck:     Comments: No meningismus. Cardiovascular:     Rate and Rhythm: Normal rate and regular rhythm.     Heart sounds: Normal heart sounds. No murmur.  Pulmonary:     Effort: Pulmonary effort is normal. No respiratory distress.     Breath sounds: Normal breath sounds.  Abdominal:     Palpations: Abdomen is soft.     Tenderness: There is no abdominal tenderness. There is no guarding or rebound.  Musculoskeletal:        General: No tenderness. Normal range of motion.     Cervical back: Normal range of motion and neck supple.  Skin:    General: Skin is warm.  Neurological:     Mental Status: He is alert and oriented to person, place, and time.     Cranial Nerves: No cranial nerve deficit.     Motor: No abnormal muscle  tone.     Coordination: Coordination normal.     Comments: No ataxia on finger to nose bilaterally. No pronator drift. 5/5 strength throughout. CN 2-12 intact.Equal grip strength. Sensation intact.   Psychiatric:        Behavior: Behavior normal.     ED Results / Procedures / Treatments   Labs (all labs ordered are listed, but only abnormal results are displayed) Labs Reviewed  CBC WITH DIFFERENTIAL/PLATELET - Abnormal; Notable for the following components:      Result Value   WBC 21.3 (*)    Neutro Abs 18.6 (*)    Monocytes Absolute 1.7 (*)    Abs Immature Granulocytes 0.10 (*)    All other  components within normal limits  COMPREHENSIVE METABOLIC PANEL - Abnormal; Notable for the following components:   Sodium 134 (*)    Glucose, Bld 242 (*)    BUN 7 (*)    Calcium 8.3 (*)    Total Protein 6.4 (*)    Albumin 3.2 (*)    All other components within normal limits  URINALYSIS, ROUTINE W REFLEX MICROSCOPIC - Abnormal; Notable for the following components:   APPearance HAZY (*)    Glucose, UA 150 (*)    Leukocytes,Ua SMALL (*)    Bacteria, UA MANY (*)    All other components within normal limits  CBG MONITORING, ED - Abnormal; Notable for the following components:   Glucose-Capillary 238 (*)    All other components within normal limits  CBG MONITORING, ED - Abnormal; Notable for the following components:   Glucose-Capillary 204 (*)    All other components within normal limits  TROPONIN I (HIGH SENSITIVITY) - Abnormal; Notable for the following components:   Troponin I (High Sensitivity) 23 (*)    All other components within normal limits  TROPONIN I (HIGH SENSITIVITY) - Abnormal; Notable for the following components:   Troponin I (High Sensitivity) 20 (*)    All other components within normal limits  URINE CULTURE  TROPONIN I (HIGH SENSITIVITY)  TROPONIN I (HIGH SENSITIVITY)    EKG EKG Interpretation  Date/Time:  Friday October 28 2019 15:26:04 EST Ventricular Rate:    78 PR Interval:    QRS Duration: 80 QT Interval:  359 QTC Calculation: 409 R Axis:   -49 Text Interpretation: Sinus rhythm Left axis deviation No significant change was found Confirmed by Ezequiel Essex [phone]) on 10/28/2019 3:46:31 PM   Radiology CT Head Wo Contrast  Result Date: 10/28/2019 CLINICAL DATA:  Recent onset memory difficulty. EXAM: CT HEAD WITHOUT CONTRAST TECHNIQUE: Contiguous axial images were obtained from the base of the skull through the vertex without intravenous contrast. COMPARISON:  None. FINDINGS: Brain: No evidence of acute infarction, hemorrhage, hydrocephalus, extra-axial collection or mass lesion/mass effect. There is chronic microvascular ischemic change of hypoattenuation seen in the subcortical and periventricular deep white matter. Remote left cerebellar infarct is noted. Vascular: Extensive atherosclerosis. Skull: Intact.  No focal lesion. Sinuses/Orbits: Status post maxillary antrostomy. Otherwise negative. Other: None. IMPRESSION: No acute intracranial abnormality. 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 infarct. Electronically Signed   By: Inge Rise M.D.   On: 10/28/2019 19:14   DG Chest Portable 1 View  Result Date: 10/28/2019 CLINICAL DATA:  Altered mental status. EXAM: PORTABLE CHEST 1 VIEW COMPARISON:  10/11/2019 FINDINGS: The cardiac silhouette, mediastinal and hilar contours are within normal limits and stable. Slightly low lung volumes with mild vascular crowding and streaky basilar atelectasis. No infiltrates, edema or effusions. The bony thorax is intact. IMPRESSION: Low lung volumes with vascular crowding and streaky basilar atelectasis. No infiltrates or effusions. Electronically Signed   By: Marijo Sanes M.D.   On: 10/28/2019 16:26    Procedures Procedures (including critical care time)  Medications Ordered in ED Medications  sodium chloride 0.9 % bolus 1,000 mL (1,000 mLs Intravenous New Bag/Given 10/28/19 1605)     ED Course  I have reviewed the triage vital signs and the nursing notes.  Pertinent labs & imaging results that were available during my care of the patient were reviewed by me and considered in my medical decision making (see chart for details).    MDM Rules/Calculators/A&P  Patient from home with hyperglycemia and generalized weakness.  No focal neurological deficits.  Blood sugar is 238 on arrival.  Has been noncompliant with his Metformin as well as Decadron it appears.  We will involve case manager and social work  Patient has hyperglycemia without evidence of DKA.  He does have a dirty looking urine culture is sent.  Will give IV Rocephin.  Leukocytosis expected in setting of recent steroid use.  CT head is stable.  Patient appears to be at his baseline mental status and has no complaints. Minimal troponin elevated. EKG nonischemic. Denies chest pain. Troponin is downtrending. Not consistent with ACS.  He is seen by social work and Sports coach who has arranged for home health as well as home medication.  Daughter reports he was having some confusion with some difficulty speaking earlier which has been going on for several days. Discussed with Dr. Amada Jupiter of neurology who agrees with MRI especially in setting of recent COVID infection.  MRI pending at shift change.  Anticipate discharge home if reassuring.  Patient given antibiotics for possible UTI, urine culture sent.  Millan Legan was evaluated in Emergency Department on 10/28/2019 for the symptoms described in the history of present illness. He was evaluated in the context of the global COVID-19 pandemic, which necessitated consideration that the patient might be at risk for infection with the SARS-CoV-2 virus that causes COVID-19. Institutional protocols and algorithms that pertain to the evaluation of patients at risk for COVID-19 are in a state of rapid change based on information released by  regulatory bodies including the CDC and federal and state organizations. These policies and algorithms were followed during the patient's care in the ED.  Final Clinical Impression(s) / ED Diagnoses Final diagnoses:  Hyperglycemia  Acute cystitis without hematuria    Rx / DC Orders ED Discharge Orders    None       Collin Rengel, Jeannett Senior, MD 10/28/19 2314    Glynn Octave, MD 10/28/19 2343

## 2019-10-28 NOTE — Discharge Instructions (Signed)
Take your medications and monitor your blood sugars as prescribed.  Follow-up with your doctor.  Take the antibiotic for possible urinary tract infection.  You will be called if your antibiotic needs to be changed.  Return to the ED with new or worsening symptoms.

## 2019-10-28 NOTE — ED Notes (Signed)
Called MRI for update; pt currently third on list awaiting for MRI

## 2019-10-28 NOTE — ED Triage Notes (Signed)
BIB EMS from home, recently discharged from [facility]. Diagnosed with Type 2 diabetes during his stay. Pt has been out of meds since last Monday, per EMS meds are "missing" from the house. VSS.

## 2019-10-29 ENCOUNTER — Inpatient Hospital Stay (HOSPITAL_COMMUNITY): Payer: Medicare PPO

## 2019-10-29 ENCOUNTER — Emergency Department (HOSPITAL_COMMUNITY): Payer: Medicare PPO

## 2019-10-29 ENCOUNTER — Encounter (HOSPITAL_COMMUNITY): Payer: Self-pay | Admitting: Internal Medicine

## 2019-10-29 ENCOUNTER — Encounter (HOSPITAL_COMMUNITY): Payer: Medicare PPO

## 2019-10-29 DIAGNOSIS — R4781 Slurred speech: Secondary | ICD-10-CM | POA: Diagnosis present

## 2019-10-29 DIAGNOSIS — N390 Urinary tract infection, site not specified: Secondary | ICD-10-CM | POA: Diagnosis present

## 2019-10-29 DIAGNOSIS — I639 Cerebral infarction, unspecified: Secondary | ICD-10-CM

## 2019-10-29 DIAGNOSIS — D573 Sickle-cell trait: Secondary | ICD-10-CM | POA: Diagnosis present

## 2019-10-29 DIAGNOSIS — R29701 NIHSS score 1: Secondary | ICD-10-CM | POA: Diagnosis present

## 2019-10-29 DIAGNOSIS — E785 Hyperlipidemia, unspecified: Secondary | ICD-10-CM | POA: Diagnosis not present

## 2019-10-29 DIAGNOSIS — N3000 Acute cystitis without hematuria: Secondary | ICD-10-CM | POA: Insufficient documentation

## 2019-10-29 DIAGNOSIS — E871 Hypo-osmolality and hyponatremia: Secondary | ICD-10-CM | POA: Diagnosis present

## 2019-10-29 DIAGNOSIS — H409 Unspecified glaucoma: Secondary | ICD-10-CM | POA: Diagnosis present

## 2019-10-29 DIAGNOSIS — Z7984 Long term (current) use of oral hypoglycemic drugs: Secondary | ICD-10-CM | POA: Diagnosis not present

## 2019-10-29 DIAGNOSIS — Z0389 Encounter for observation for other suspected diseases and conditions ruled out: Secondary | ICD-10-CM

## 2019-10-29 DIAGNOSIS — A4150 Gram-negative sepsis, unspecified: Secondary | ICD-10-CM | POA: Diagnosis present

## 2019-10-29 DIAGNOSIS — M170 Bilateral primary osteoarthritis of knee: Secondary | ICD-10-CM | POA: Diagnosis present

## 2019-10-29 DIAGNOSIS — G9341 Metabolic encephalopathy: Secondary | ICD-10-CM | POA: Diagnosis present

## 2019-10-29 DIAGNOSIS — Z8616 Personal history of COVID-19: Secondary | ICD-10-CM | POA: Diagnosis not present

## 2019-10-29 DIAGNOSIS — Z66 Do not resuscitate: Secondary | ICD-10-CM | POA: Diagnosis present

## 2019-10-29 DIAGNOSIS — I361 Nonrheumatic tricuspid (valve) insufficiency: Secondary | ICD-10-CM

## 2019-10-29 DIAGNOSIS — Z832 Family history of diseases of the blood and blood-forming organs and certain disorders involving the immune mechanism: Secondary | ICD-10-CM | POA: Diagnosis not present

## 2019-10-29 DIAGNOSIS — R739 Hyperglycemia, unspecified: Secondary | ICD-10-CM | POA: Diagnosis not present

## 2019-10-29 DIAGNOSIS — E1165 Type 2 diabetes mellitus with hyperglycemia: Secondary | ICD-10-CM | POA: Diagnosis present

## 2019-10-29 DIAGNOSIS — U071 COVID-19: Secondary | ICD-10-CM | POA: Diagnosis not present

## 2019-10-29 DIAGNOSIS — R270 Ataxia, unspecified: Secondary | ICD-10-CM | POA: Diagnosis present

## 2019-10-29 LAB — CBC
HCT: 40 % (ref 39.0–52.0)
Hemoglobin: 13 g/dL (ref 13.0–17.0)
MCH: 28.2 pg (ref 26.0–34.0)
MCHC: 32.5 g/dL (ref 30.0–36.0)
MCV: 86.8 fL (ref 80.0–100.0)
Platelets: 128 10*3/uL — ABNORMAL LOW (ref 150–400)
RBC: 4.61 MIL/uL (ref 4.22–5.81)
RDW: 12.7 % (ref 11.5–15.5)
WBC: 19.7 10*3/uL — ABNORMAL HIGH (ref 4.0–10.5)
nRBC: 0 % (ref 0.0–0.2)

## 2019-10-29 LAB — GLUCOSE, CAPILLARY
Glucose-Capillary: 186 mg/dL — ABNORMAL HIGH (ref 70–99)
Glucose-Capillary: 168 mg/dL — ABNORMAL HIGH (ref 70–99)
Glucose-Capillary: 144 mg/dL — ABNORMAL HIGH (ref 70–99)
Glucose-Capillary: 185 mg/dL — ABNORMAL HIGH (ref 70–99)

## 2019-10-29 LAB — HEMOGLOBIN A1C
Hgb A1c MFr Bld: 8.3 % — ABNORMAL HIGH (ref 4.8–5.6)
Mean Plasma Glucose: 191.51 mg/dL

## 2019-10-29 LAB — TROPONIN I (HIGH SENSITIVITY): Troponin I (High Sensitivity): 23 ng/L — ABNORMAL HIGH (ref <18)

## 2019-10-29 LAB — LIPID PANEL
Cholesterol: 114 mg/dL (ref 0–200)
HDL: 46 mg/dL (ref >40)
LDL Cholesterol: 56 mg/dL (ref 0–99)
Total CHOL/HDL Ratio: 2.5 RATIO
Triglycerides: 59 mg/dL (ref <150)
VLDL: 12 mg/dL (ref 0–40)

## 2019-10-29 LAB — ECHOCARDIOGRAM COMPLETE

## 2019-10-29 LAB — CREATININE, SERUM
Creatinine, Ser: 0.97 mg/dL (ref 0.61–1.24)
GFR calc Af Amer: >60 mL/min (ref >60)
GFR calc non Af Amer: >60 mL/min (ref >60)

## 2019-10-29 IMAGING — MR MR HEAD W/O CM
10 of 12 series · 38 of 48 positions shown · non-contrast
Comparison: Head CT [DATE]

CLINICAL DATA: Generalized weakness.  Coronavirus infection.

EXAM:
MRI HEAD WITHOUT CONTRAST
TECHNIQUE: Multiplanar, multiecho pulse sequences of the brain and surrounding
structures were obtained without intravenous contrast.

[Series 5: DWI · axial · 3.0mm · 0.88mm/px · z∈[-58,+79]mm · 8 of 94 slices shown (1 of 4)]
[im 1/94]
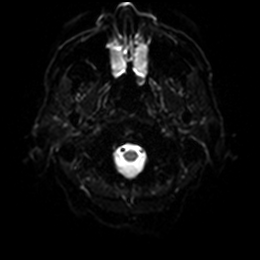
[im 14/94]
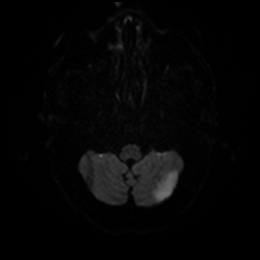
[im 27/94]
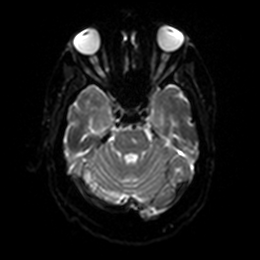
[im 40/94]
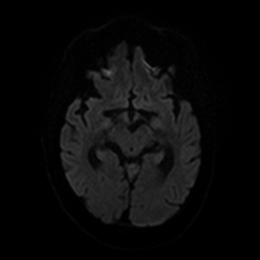
[im 54/94]
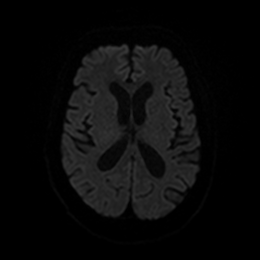
[im 67/94]
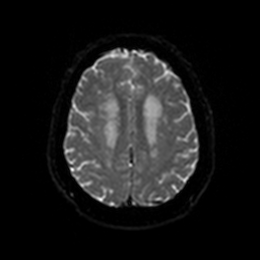
[im 80/94]
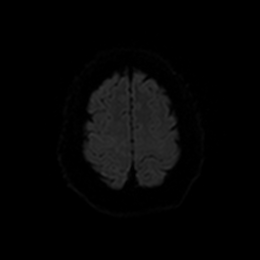
[im 94/94]
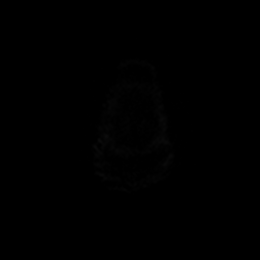

[Series 6: DWI · axial · 3.0mm · 0.88mm/px · z∈[-58,+79]mm · 4 of 47 slices shown (2 of 4)]
[im 1/47]
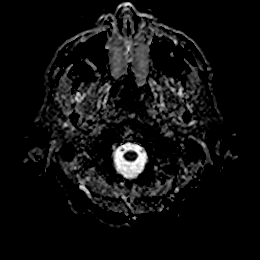
[im 16/47]
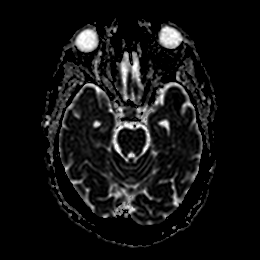
[im 31/47]
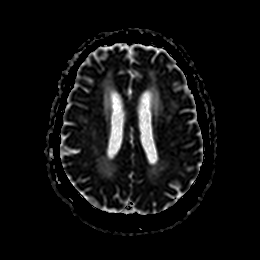
[im 47/47]
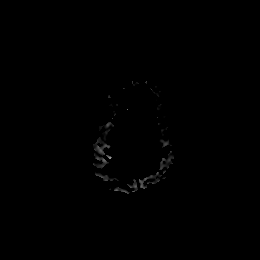

[Series 7: DWI · coronal · 4.0mm · 0.88mm/px · 6 of 70 slices shown (3 of 4)]
[im 1/70]
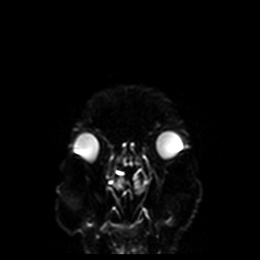
[im 14/70]
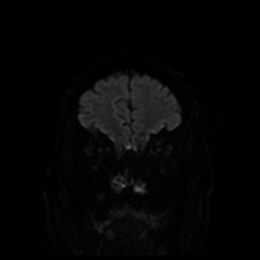
[im 28/70]
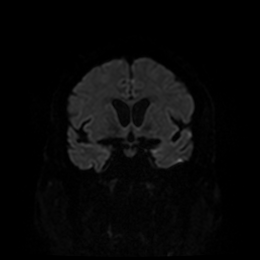
[im 42/70]
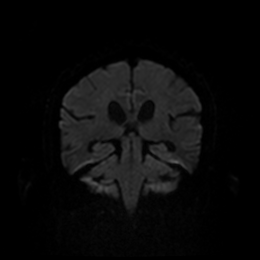
[im 56/70]
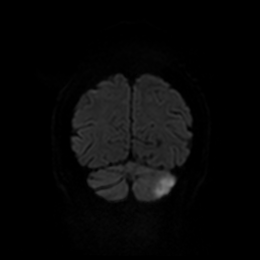
[im 70/70]
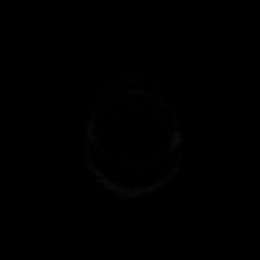

[Series 8: DWI · coronal · 4.0mm · 0.88mm/px · 3 of 34 slices shown (4 of 4)]
[im 1/34]
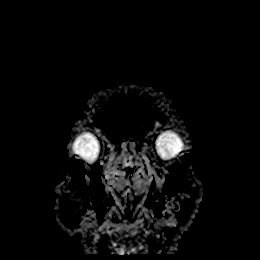
[im 17/34]
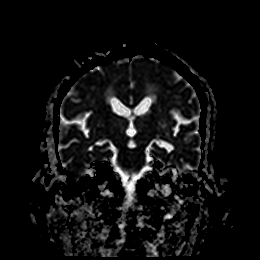
[im 34/34]
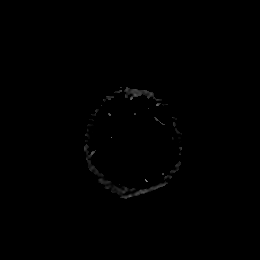

[Series 9: T1 · sagittal · 5.0mm · 0.76mm/px · 2 of 25 slices shown]
[im 1/25]
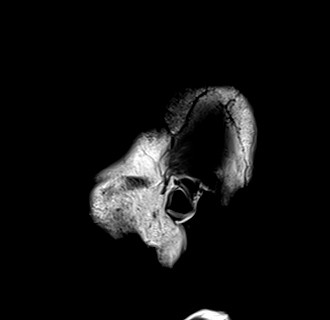
[im 25/25]
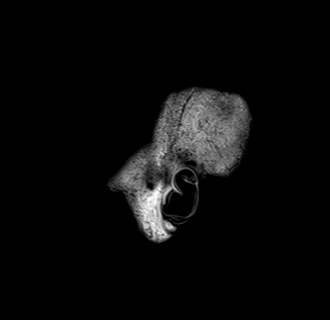

[Series 10: T2 · axial · 5.0mm · 0.72mm/px · z∈[-72,+83]mm · 2 of 27 slices shown (1 of 2)]
[im 1/27]
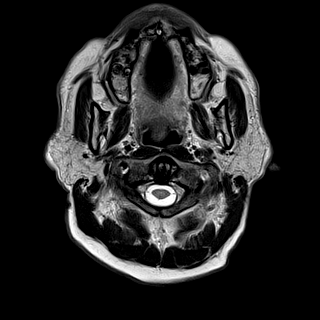
[im 27/27]
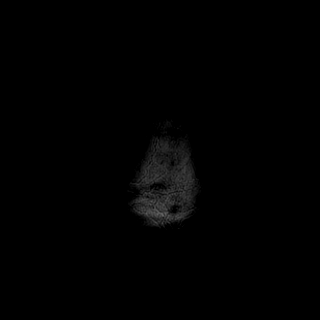

[Series 11: FLAIR · axial · 5.0mm · 0.45mm/px · z∈[-71,+84]mm · 2 of 27 slices shown]
[im 1/27]
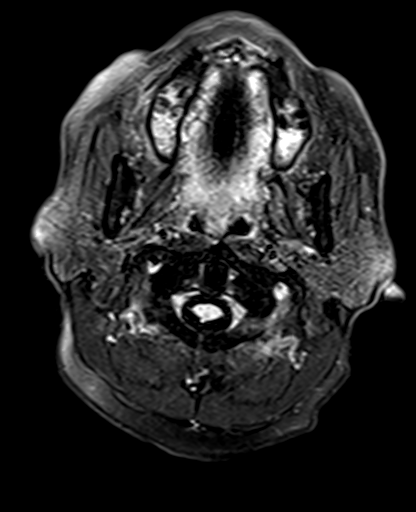
[im 27/27]
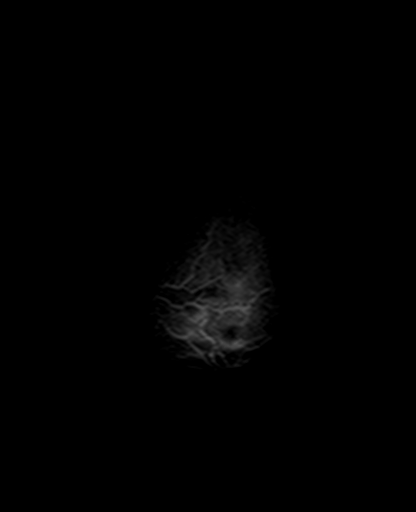

[Series 13: pha_images · axial · 3.0mm · 0.90mm/px · z∈[-73,+100]mm · 4 of 55 slices shown]
[im 1/55]
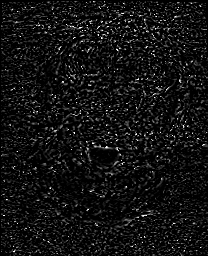
[im 19/55]
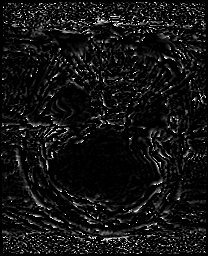
[im 37/55]
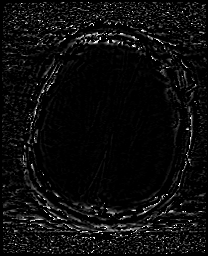
[im 55/55]
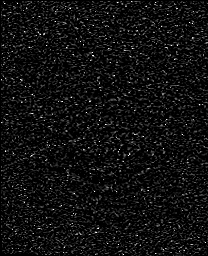

[Series 14: swi_images · axial · 3.0mm · 0.90mm/px · z∈[-76,+100]mm · 5 of 60 slices shown]
[im 1/60]
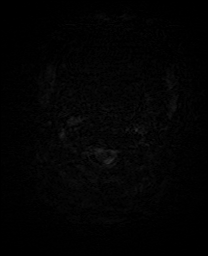
[im 15/60]
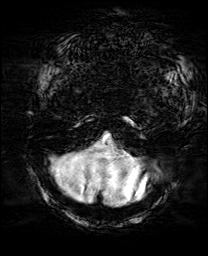
[im 30/60]
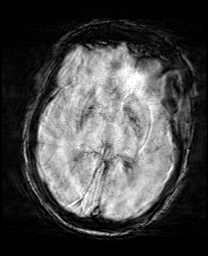
[im 45/60]
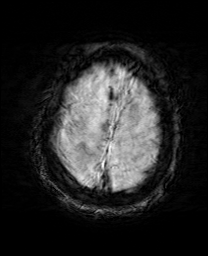
[im 60/60]
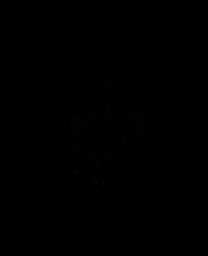

[Series 18: T2 · coronal · 5.0mm · 0.34mm/px · 2 of 29 slices shown (2 of 2)]
[im 1/29]
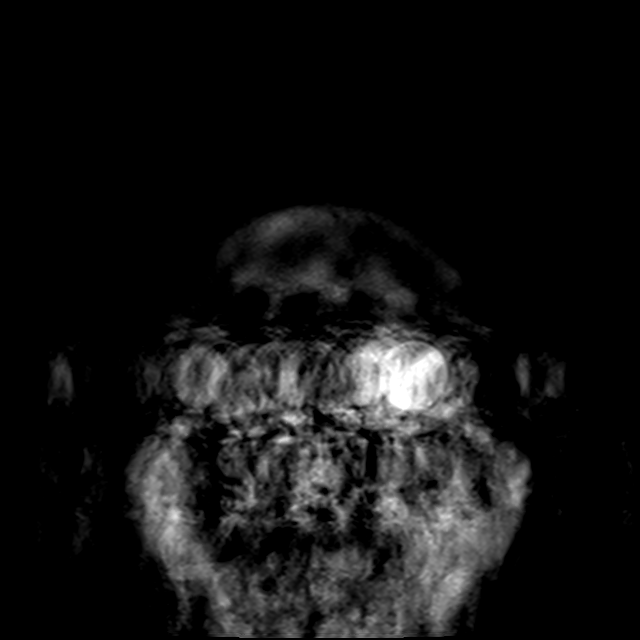
[im 29/29]
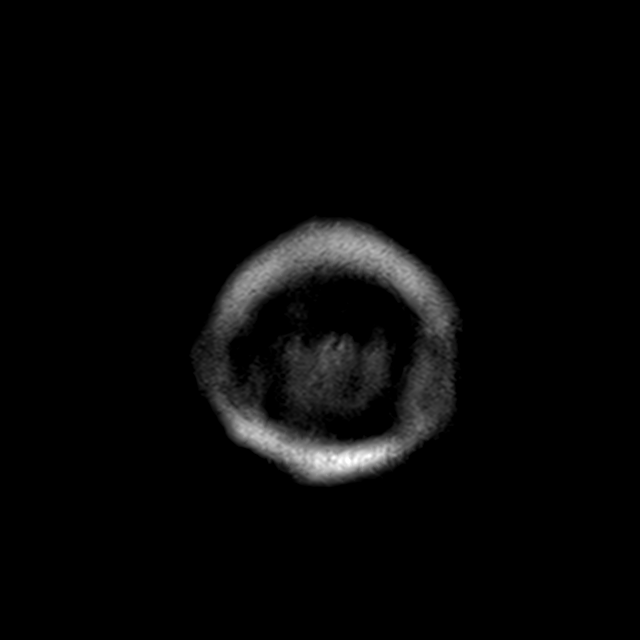

[38 of 48 positions shown; findings below may reference images not displayed]

FINDINGS: Brain: Acute/early subacute infarction of the left peripheral
midportion of the cerebellum. No other acute intracranial finding.
Old small vessel infarction of the pons. Cerebral hemispheres show
chronic small-vessel ischemic changes of a moderate to marked degree
throughout the white matter. Old lacunar infarction right thalamus.
No mass, hemorrhage, hydrocephalus or extra-axial collection. Many
of the sequences are degraded by motion.

Vascular: Major vessels at the base of the brain show flow.

Skull and upper cervical spine: Negative

Sinuses/Orbits: Clear/normal

Other: None
IMPRESSION: Acute to early subacute infarction in the peripheral left midportion
of the cerebellum. Mild swelling but no hemorrhage.

Old small vessel infarctions of the pons, right thalamus and
extensively throughout the cerebral hemispheric white matter.

## 2019-10-29 IMAGING — CT CT ANGIO HEAD
3 of 7 series · 10 of 36 positions shown · IV contrast (OMNI 350)
Comparison: Brain MRI from earlier today
COMPARISON: Brain MRI from earlier today

Addendum:
CLINICAL DATA: Stroke follow-up.

EXAM:
CT ANGIOGRAPHY HEAD AND NECK
TECHNIQUE: Multidetector CT imaging of the head and neck was performed using
the standard protocol during bolus administration of intravenous
contrast. Multiplanar CT image reconstructions and MIPs were
obtained to evaluate the vascular anatomy. Carotid stenosis
measurements (when applicable) are obtained utilizing NASCET
criteria, using the distal internal carotid diameter as the
denominator.
CONTRAST:  75mL OMNIPAQUE IOHEXOL 350 MG/ML SOLN

[Series 5: cta neck · axial · 0.46mm/px · z∈[-189,-69]mm · 2 of 180 slices shown]
[im 60/180  soft-tissue]
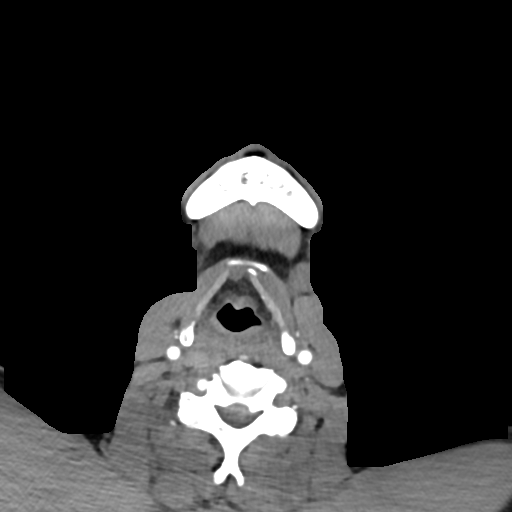
[im 120/180  bone]
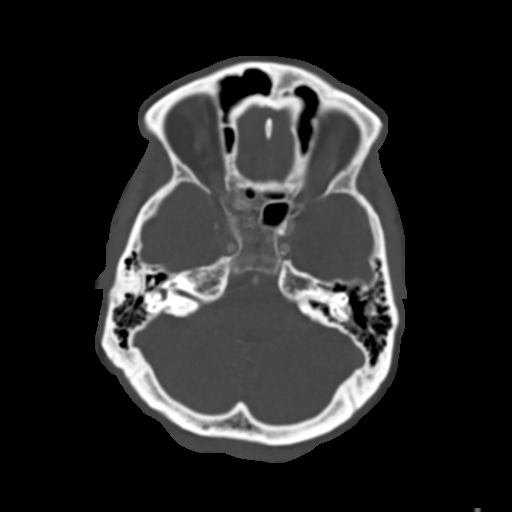

[Series 7: cta neck axial · axial · 0.39mm/px · z∈[-284,-48]mm · 6 of 358 slices shown]
[im 52/358  soft-tissue]
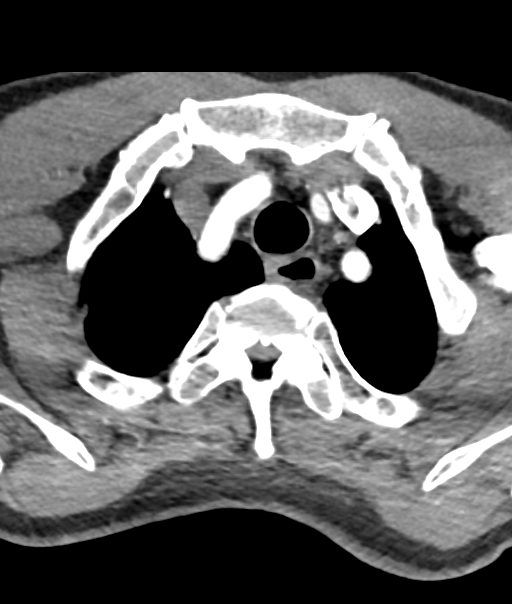
[im 103/358  soft-tissue]
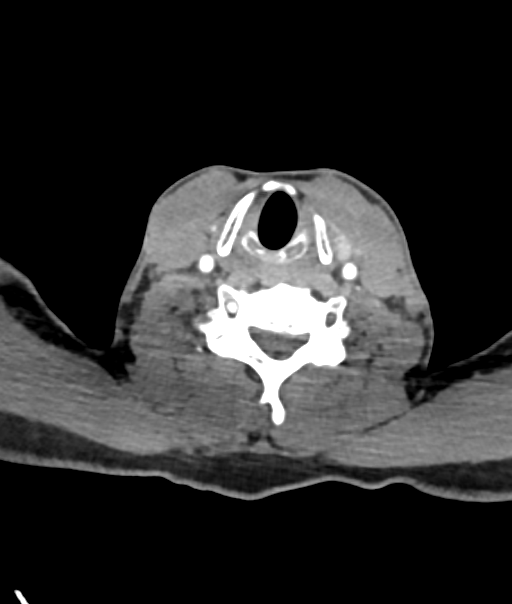
[im 154/358  soft-tissue]
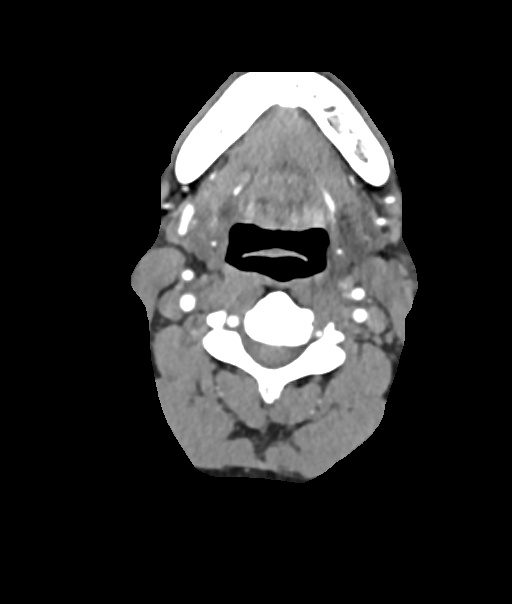
[im 205/358  soft-tissue]
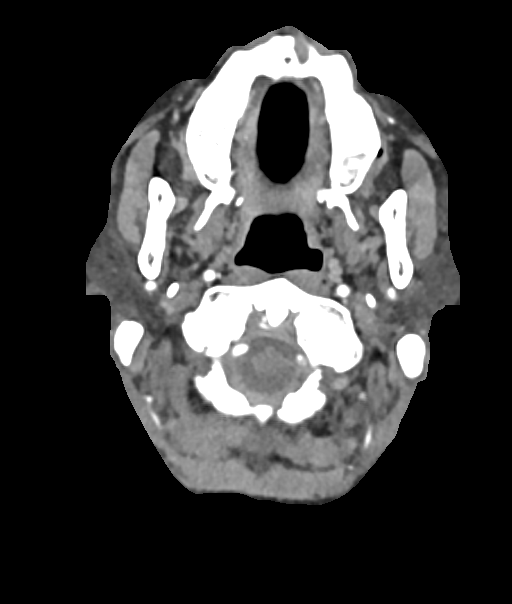
[im 256/358  soft-tissue]
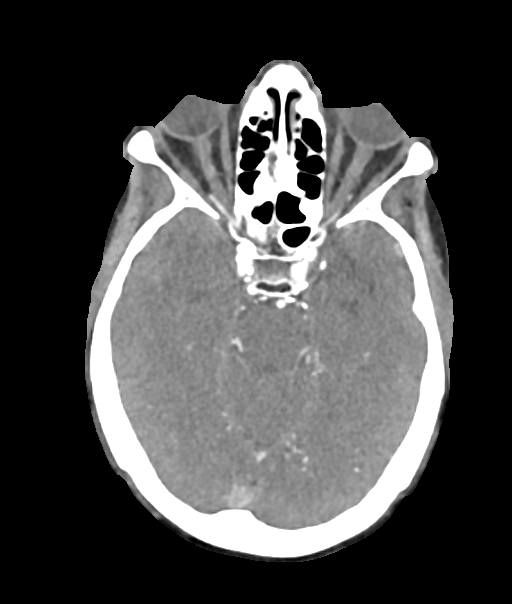
[im 307/358  soft-tissue]
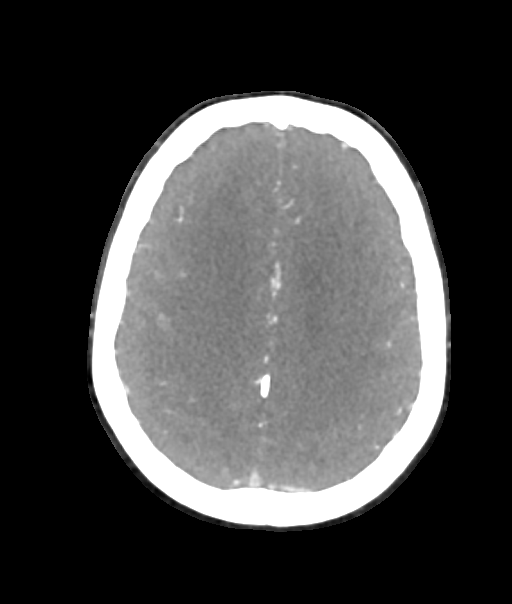

[Series 9: cta neck sagittal · sagittal · 0.47mm/px · 2 of 201 slices shown]
[im 29/201  soft-tissue]
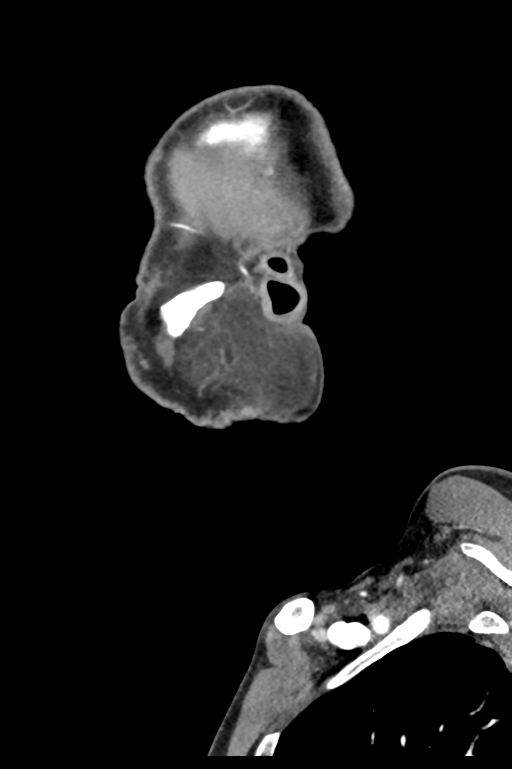
[im 173/201  soft-tissue]
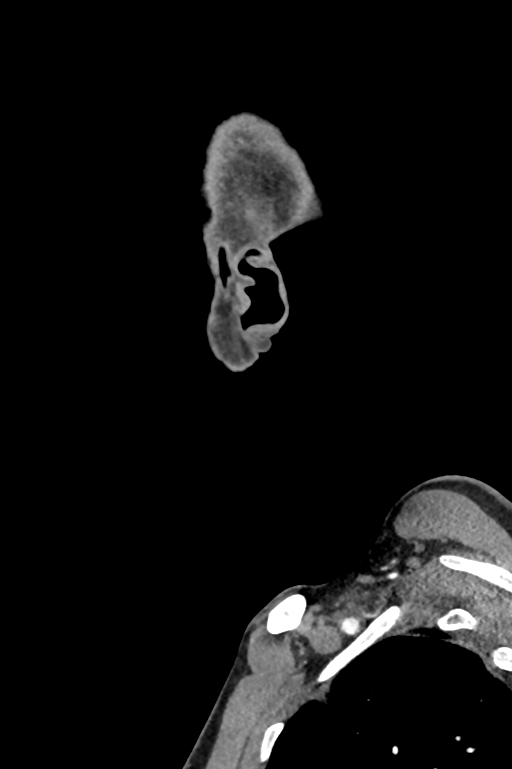

[10 of 36 positions shown; findings below may reference images not displayed]

FINDINGS: CTA NECK FINDINGS

Aortic arch: Atheromatous arch with irregular plaque at the isthmus.

Right carotid system: Mild calcified plaque at the bulb. No
stenosis, ulceration, or beading.

Left carotid system: Small web seen at the ICA bulb. No stenosis or
beading.

Vertebral arteries: 60% proximal left subclavian stenosis due to
vessel kink and plaque. Soon after its origin the left vertebral
artery is occluded with thready intermittent reconstitution and
patency at the level of the dura. The right vertebral artery is
smooth and widely patent.

Skeleton: Cervical spine degeneration.  Negative

Other neck: No acute finding.

Upper chest: Mild patchy ground-glass opacity in the apical lungs.

Review of the MIP images confirms the above findings

CTA HEAD FINDINGS

Anterior circulation: Atherosclerotic plaque along the carotid
siphons which is moderate. Accounting for artifact from calcified
plaque blooming no focal flow reducing stenosis is suspected. There
is no branch occlusion, beading, or aneurysm. Scattered atheromatous
changes of medium size branches, most notably right A3 and left
M3/4.

Posterior circulation: The left vertebral artery is patent at the
dura but there is prominent narrowing on sagittal reformats as it
crosses through the dura, likely with low-density plaque along the
upper wall. The left vertebral be comes attenuated at the PICA and
thereafter is occluded until there is a wisp of reconstitution at
the basilar. This is likely in acute occlusion given the findings in
the neck and on MRI. No aneurysm, beading, or more distal embolus is
seen. Distal PCA atheromatous changes.

Venous sinuses: Negative in the arterial phase

Anatomic variants: No significant finding

Review of the MIP images confirms the above findings
IMPRESSION: 1. Left V1 segment occlusion with thready intermittent
reconstitution, likely acute given the appearance and MRI findings.
2. 60% left proximal subclavian stenosis due to vessel kink and
plaque.
3. Intracranial atherosclerosis mainly affecting medium size vessels
and the siphons.

ADDENDUM:
Patchy opacity in the apical lungs, please ensure patient is
[6U] negative.

*** End of Addendum ***
FINDINGS: CTA NECK FINDINGS

Aortic arch: Atheromatous arch with irregular plaque at the isthmus.

Right carotid system: Mild calcified plaque at the bulb. No
stenosis, ulceration, or beading.

Left carotid system: Small web seen at the ICA bulb. No stenosis or
beading.

Vertebral arteries: 60% proximal left subclavian stenosis due to
vessel kink and plaque. Soon after its origin the left vertebral
artery is occluded with thready intermittent reconstitution and
patency at the level of the dura. The right vertebral artery is
smooth and widely patent.

Skeleton: Cervical spine degeneration.  Negative

Other neck: No acute finding.

Upper chest: Mild patchy ground-glass opacity in the apical lungs.

Review of the MIP images confirms the above findings

CTA HEAD FINDINGS

Anterior circulation: Atherosclerotic plaque along the carotid
siphons which is moderate. Accounting for artifact from calcified
plaque blooming no focal flow reducing stenosis is suspected. There
is no branch occlusion, beading, or aneurysm. Scattered atheromatous
changes of medium size branches, most notably right A3 and left
M3/4.

Posterior circulation: The left vertebral artery is patent at the
dura but there is prominent narrowing on sagittal reformats as it
crosses through the dura, likely with low-density plaque along the
upper wall. The left vertebral be comes attenuated at the PICA and
thereafter is occluded until there is a wisp of reconstitution at
the basilar. This is likely in acute occlusion given the findings in
the neck and on MRI. No aneurysm, beading, or more distal embolus is
seen. Distal PCA atheromatous changes.

Venous sinuses: Negative in the arterial phase

Anatomic variants: No significant finding

Review of the MIP images confirms the above findings
IMPRESSION: 1. Left V1 segment occlusion with thready intermittent
reconstitution, likely acute given the appearance and MRI findings.
2. 60% left proximal subclavian stenosis due to vessel kink and
plaque.
3. Intracranial atherosclerosis mainly affecting medium size vessels
and the siphons.

## 2019-10-29 MED ORDER — ASPIRIN 325 MG PO TABS
325.0000 mg | ORAL_TABLET | Freq: Every day | ORAL | Status: DC
  Administered 2019-10-29 – 2019-10-31 (×3): 325 mg via ORAL
  Filled 2019-10-29 (×3): qty 1

## 2019-10-29 MED ORDER — ATORVASTATIN CALCIUM 40 MG PO TABS
40.0000 mg | ORAL_TABLET | Freq: Every day | ORAL | Status: DC
  Administered 2019-10-29: 17:00:00 40 mg via ORAL
  Filled 2019-10-29: qty 1

## 2019-10-29 MED ORDER — ATORVASTATIN CALCIUM 10 MG PO TABS
20.0000 mg | ORAL_TABLET | Freq: Every day | ORAL | Status: DC
  Administered 2019-10-30: 20 mg via ORAL
  Filled 2019-10-29: qty 2

## 2019-10-29 MED ORDER — ACETAMINOPHEN 650 MG RE SUPP: 650.0000 mg | RECTAL | Status: DC | PRN

## 2019-10-29 MED ORDER — ENOXAPARIN SODIUM 40 MG/0.4ML ~~LOC~~ SOLN
40.0000 mg | SUBCUTANEOUS | Status: DC
  Administered 2019-10-29 – 2019-10-31 (×3): 40 mg via SUBCUTANEOUS
  Filled 2019-10-29 (×3): qty 0.4

## 2019-10-29 MED ORDER — BRIMONIDINE TARTRATE 0.2 % OP SOLN
1.0000 [drp] | Freq: Two times a day (BID) | OPHTHALMIC | Status: DC
  Administered 2019-10-29 – 2019-10-31 (×5): 1 [drp] via OPHTHALMIC
  Filled 2019-10-29: qty 5

## 2019-10-29 MED ORDER — INSULIN ASPART 100 UNIT/ML ~~LOC~~ SOLN
0.0000 [IU] | Freq: Three times a day (TID) | SUBCUTANEOUS | Status: DC
  Administered 2019-10-29 (×2): 2 [IU] via SUBCUTANEOUS
  Administered 2019-10-29: 1 [IU] via SUBCUTANEOUS
  Administered 2019-10-30: 12:00:00 5 [IU] via SUBCUTANEOUS
  Administered 2019-10-30 – 2019-10-31 (×3): 2 [IU] via SUBCUTANEOUS
  Administered 2019-10-31: 13:00:00 3 [IU] via SUBCUTANEOUS

## 2019-10-29 MED ORDER — ATORVASTATIN CALCIUM 10 MG PO TABS: 20.0000 mg | ORAL_TABLET | Freq: Every day | ORAL | Status: DC

## 2019-10-29 MED ORDER — ACETAMINOPHEN 325 MG PO TABS: 650.0000 mg | ORAL_TABLET | ORAL | Status: DC | PRN

## 2019-10-29 MED ORDER — STROKE: EARLY STAGES OF RECOVERY BOOK
Freq: Once | Status: AC
  Filled 2019-10-29: qty 1

## 2019-10-29 MED ORDER — ASPIRIN 300 MG RE SUPP: 300.0000 mg | Freq: Every day | RECTAL | Status: DC

## 2019-10-29 MED ORDER — SODIUM CHLORIDE 0.9 % IV SOLN
1.0000 g | INTRAVENOUS | Status: DC
  Administered 2019-10-29 – 2019-10-30 (×2): 1 g via INTRAVENOUS
  Filled 2019-10-29 (×2): qty 10

## 2019-10-29 MED ORDER — SODIUM CHLORIDE 0.9 % IV SOLN: INTRAVENOUS | Status: AC

## 2019-10-29 MED ORDER — CLOPIDOGREL BISULFATE 75 MG PO TABS
75.0000 mg | ORAL_TABLET | Freq: Every day | ORAL | Status: DC
  Administered 2019-10-29 – 2019-10-31 (×3): 75 mg via ORAL
  Filled 2019-10-29 (×3): qty 1

## 2019-10-29 MED ORDER — ACETAMINOPHEN 160 MG/5ML PO SOLN: 650.0000 mg | ORAL | Status: DC | PRN

## 2019-10-29 MED ORDER — IOHEXOL 350 MG/ML SOLN
75.0000 mL | Freq: Once | INTRAVENOUS | Status: AC | PRN
  Administered 2019-10-29: 75 mL via INTRAVENOUS

## 2019-10-29 NOTE — Progress Notes (Signed)
  Echocardiogram 2D Echocardiogram has been performed.  Gerda Diss 10/29/2019, 10:27 AM

## 2019-10-29 NOTE — H&P (Signed)
History and Physical    Aaron Mendez BHA:[id number] DOB: Aug 01, 1946 DOA: 10/28/2019  PCP: Everrett Coombe, DO   Patient coming from: Home.  History obtained from patient's daughter.  Chief Complaint: Confusion.  HPI: Aaron Mendez is a 74 y.o. male with history of hyperlipidemia and glaucoma was recently admitted and discharged on February 8 2 weeks ago after being treated for Covid infection lives at home alone and usually visited by patient's daughter was found to be increasingly confused and worsening confusion over the last 1 week.  Yesterday when patient's daughter went to check on the patient patient was walking naked and and was not able to recognize the patient started.  No new changes in medications except for patient was prescribed Metformin and was on Decadron which is not sure if patient has taken it.  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  Clinical neurologist Dr. Otelia Limes has been consulted.  In addition patient's labs also show WBC count of 21.3 patient is febrile 100.4 degrees temperature and UA is consistent with UTI.  Chest x-ray does not show any infiltrates.  EKG shows normal sinus rhythm.  Patient admitted for CVA with fever likely from UTI and acute encephalopathy likely from both.  Review of Systems: As per HPI, rest all negative.   Past Medical History:  Diagnosis Date  . Bilateral primary osteoarthritis of knee   . Sickle cell trait (HCC)     History reviewed. No pertinent surgical history.   reports that he has never smoked. He has never used smokeless tobacco. He reports that he does not drink alcohol or use drugs.  No Known Allergies  Family History  Problem Relation Age of Onset  . Sickle cell anemia Son     Prior to Admission medications   Medication Sig Start Date End Date Taking? Authorizing Provider  atorvastatin (LIPITOR) 20 MG tablet TAKE 1  TABLET(20 MG) BY MOUTH DAILY Patient taking differently: Take 20 mg by mouth daily.  08/19/19   Everrett Coombe, DO  brimonidine (ALPHAGAN) 0.2 % ophthalmic solution Place 1 drop into both eyes 2 (two) times daily.  08/22/19   [provider]  cephALEXin (KEFLEX) 500 MG capsule Take 1 capsule (500 mg total) by mouth 3 (three) times daily. 10/28/19   Rancour, Jeannett Senior, MD  dexamethasone (DECADRON) 6 MG tablet Take 1 tablet (6 mg total) by mouth daily. 10/17/19   Leatha Gilding, MD  ibuprofen (ADVIL) 800 MG tablet Take 800 mg by mouth 3 (three) times daily as needed (pain.).  07/07/19   [provider]  metFORMIN (GLUCOPHAGE) 500 MG tablet Take 1 tablet (500 mg total) by mouth 2 (two) times daily with a meal. 10/17/19   Gherghe, Daylene Katayama, MD  Multiple Vitamin (MULTIVITAMIN WITH MINERALS) TABS tablet Take 1 tablet by mouth daily.    [provider]  naproxen (NAPROSYN) 500 MG tablet TAKE 1 TABLET(500 MG) BY MOUTH TWICE DAILY WITH A MEAL Patient taking differently: Take 500 mg by mouth 2 (two) times daily as needed (pain.).  10/04/19   Everrett Coombe, DO    Physical Exam: Constitutional: Moderately built and nourished. Vitals:   10/29/19 0545 10/29/19 0600 10/29/19 0615 10/29/19 0630  BP:  114/67  121/71  Pulse:      Resp: (!) 22 19 19 18   Temp:      TempSrc:      SpO2:  Eyes: Anicteric no pallor. ENMT: No discharge from the ears eyes nose or mouth. Neck: No mass felt.  No neck rigidity. Respiratory: No rhonchi or crepitations. Cardiovascular: S1-S2 heard. Abdomen: Soft nontender bowel sounds present. Musculoskeletal: No edema. Skin: No rash. Neurologic: Alert awake oriented to name moves all extremities 5 x 5.  No facial asymmetry and is benign. Psychiatric: Oriented to his name.  Mildly slow.   Labs on Admission: I have personally reviewed following labs and imaging studies  CBC: Recent Labs  Lab 10/28/19 1600  WBC 21.3*  NEUTROABS 18.6*  HGB 14.2    HCT 44.1  MCV 86.8  PLT 158   Basic Metabolic Panel: Recent Labs  Lab 10/28/19 1600  NA 134*  K 4.1  CL 100  CO2 24  GLUCOSE 242*  BUN 7*  CREATININE 1.08  CALCIUM 8.3*   GFR: Estimated Creatinine Clearance: 67.6 mL/min (by C-G formula based on SCr of 1.08 mg/dL). Liver Function Tests: Recent Labs  Lab 10/28/19 1600  AST 19  ALT 24  ALKPHOS 56  BILITOT 0.8  PROT 6.4*  ALBUMIN 3.2*   No results for input(s): LIPASE, AMYLASE in the last 168 hours. No results for input(s): AMMONIA in the last 168 hours. Coagulation Profile: No results for input(s): INR, PROTIME in the last 168 hours. Cardiac Enzymes: No results for input(s): CKTOTAL, CKMB, CKMBINDEX, TROPONINI in the last 168 hours. BNP (last 3 results) No results for input(s): PROBNP in the last 8760 hours. HbA1C: No results for input(s): HGBA1C in the last 72 hours. CBG: Recent Labs  Lab 10/28/19 1532 10/28/19 1939  GLUCAP 238* 204*   Lipid Profile: No results for input(s): CHOL, HDL, LDLCALC, TRIG, CHOLHDL, LDLDIRECT in the last 72 hours. Thyroid Function Tests: No results for input(s): TSH, T4TOTAL, FREET4, T3FREE, THYROIDAB in the last 72 hours. Anemia Panel: No results for input(s): VITAMINB12, FOLATE, FERRITIN, TIBC, IRON, RETICCTPCT in the last 72 hours. Urine analysis:    Component Value Date/Time   COLORURINE YELLOW 10/28/2019 1733   APPEARANCEUR HAZY (A) 10/28/2019 1733   LABSPEC 1.011 10/28/2019 1733   PHURINE 7.0 10/28/2019 1733   GLUCOSEU 150 (A) 10/28/2019 1733   HGBUR NEGATIVE 10/28/2019 1733   BILIRUBINUR NEGATIVE 10/28/2019 1733   KETONESUR NEGATIVE 10/28/2019 1733   PROTEINUR NEGATIVE 10/28/2019 1733   NITRITE NEGATIVE 10/28/2019 1733   LEUKOCYTESUR SMALL (A) 10/28/2019 1733   Sepsis Labs: @LABRCNTIP (procalcitonin:4,lacticidven:4) )No results found for this or any previous visit (from the past 240 hour(s)).   Radiological Exams on Admission: CT Head Wo Contrast  Result Date:  10/28/2019 CLINICAL DATA:  Recent onset memory difficulty. EXAM: CT HEAD WITHOUT CONTRAST TECHNIQUE: Contiguous axial images were obtained from the base of the skull through the vertex without intravenous contrast. COMPARISON:  None. FINDINGS: Brain: No evidence of acute infarction, hemorrhage, hydrocephalus, extra-axial collection or mass lesion/mass effect. There is chronic microvascular ischemic change of hypoattenuation seen in the subcortical and periventricular deep white matter. Remote left cerebellar infarct is noted. Vascular: Extensive atherosclerosis. Skull: Intact.  No focal lesion. Sinuses/Orbits: Status post maxillary antrostomy. Otherwise negative. Other: None. IMPRESSION: No acute intracranial abnormality. 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 infarct. Electronically Signed   By: 10/30/2019 M.D.   On: 10/28/2019 19:14   MR BRAIN WO CONTRAST  Result Date: 10/29/2019 CLINICAL DATA:  Generalized weakness.  Coronavirus infection. EXAM: MRI HEAD WITHOUT CONTRAST TECHNIQUE: Multiplanar, multiecho pulse sequences of the brain and surrounding structures were obtained without intravenous contrast. COMPARISON:  Head CT 10/28/2019 FINDINGS: Brain: Acute/early subacute infarction of the left peripheral midportion of the cerebellum. No other acute intracranial finding. 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 Cerebral hemispheres show chronic small-vessel ischemic changes of a moderate to marked degree throughout the white matter. Old lacunar infarction right thalamus. No mass, hemorrhage, hydrocephalus or extra-axial collection. Many of the sequences are degraded by motion. Vascular: Major vessels at the base of the brain show flow. Skull and upper cervical spine: Negative Sinuses/Orbits: Clear/normal Other: None IMPRESSION: Acute to early subacute infarction in the peripheral left midportion of the cerebellum. Mild swelling but no hemorrhage. Old small vessel infarctions of the  pons, right thalamus and extensively throughout the cerebral hemispheric white matter. Electronically Signed   By: Nelson Chimes M.D.   On: 10/29/2019 02:39   DG Chest Portable 1 View  Result Date: 10/28/2019 CLINICAL DATA:  Altered mental status. EXAM: PORTABLE CHEST 1 VIEW COMPARISON:  10/11/2019 FINDINGS: The cardiac silhouette, mediastinal and hilar contours are within normal limits and stable. Slightly low lung volumes with mild vascular crowding and streaky basilar atelectasis. No infiltrates, edema or effusions. The bony thorax is intact. IMPRESSION: Low lung volumes with vascular crowding and streaky basilar atelectasis. No infiltrates or effusions. Electronically Signed   By: Marijo Sanes M.D.   On: 10/28/2019 16:26    EKG: Independently reviewed.  Normal sinus rhythm.  Assessment/Plan Principal Problem:   Acute CVA (cerebrovascular accident) ([place]) Active Problems:   Acute lower UTI   Hyperglycemia    1. Acute/subacute CVA -appreciate neurology consult.  MRA of the brain 2D echo carotid Doppler swallow evaluation neurochecks hemoglobin A1c lipid panel aspirin and statins.  Physical therapy consult. 2. Fever likely from UTI however patient has significant leukocytosis we will get blood cultures.  Chest x-ray unremarkable.  On ceftriaxone. 3. Acute encephalopathy likely from stroke and fever. 4. Hyperglycemia -recent hemoglobin A1c was 5.8.  We will keep patient on sliding scale coverage and I suspect patient has diabetes. 5. Hyperlipidemia on statins. 6. Recent COVID-19 infection was treated.  Given the acute CVA with fever and UTI will need close monitoring for any deterioration in inpatient status.   DVT prophylaxis: Lovenox. Code Status: DNR as confirmed with patient's daughter. Family Communication: Patient's daughter. Disposition Plan: To be determined. Consults called: Neurology. Admission status: Inpatient.   Rise Patience MD [facility] Pager [phone].  If 7PM-7AM, please contact night-coverage [email or web address] Password [facility]  10/29/2019, 6:35 AM

## 2019-10-29 NOTE — ED Provider Notes (Signed)
3:52 AM Assumed care from Dr. Manus Gunning, please see their note for full history, physical and decision making until this point. In brief this is a 74 y.o. year old male who presented to the ED tonight with Hyperglycemia     Assumed care pending MRI to rule out neurologic cause for her symptoms.  Apparently has a UTI but apparently had an episode today of difficulty speaking and altered mental status which is since resolved.  Had discussed it with neurology who recommended MRI.  MRI shows early stroke.  Will consult neurology and admit to medicine.  Aaron Mendez was evaluated in Emergency Department on 10/29/2019 for the symptoms described in the history of present illness. He was evaluated in the context of the global COVID-19 pandemic, which necessitated consideration that the patient might be at risk for infection with the SARS-CoV-2 virus that causes COVID-19. Institutional protocols and algorithms that pertain to the evaluation of patients at risk for COVID-19 are in a state of rapid change based on information released by regulatory bodies including the CDC and federal and state organizations. These policies and algorithms were followed during the patient's care in the ED.   Labs, studies and imaging reviewed by myself and considered in medical decision making if ordered. Imaging interpreted by radiology.  Labs Reviewed  CBC WITH DIFFERENTIAL/PLATELET - Abnormal; Notable for the following components:      Result Value   WBC 21.3 (*)    Neutro Abs 18.6 (*)    Monocytes Absolute 1.7 (*)    Abs Immature Granulocytes 0.10 (*)    All other components within normal limits  COMPREHENSIVE METABOLIC PANEL - Abnormal; Notable for the following components:   Sodium 134 (*)    Glucose, Bld 242 (*)    BUN 7 (*)    Calcium 8.3 (*)    Total Protein 6.4 (*)    Albumin 3.2 (*)    All other components within normal limits  URINALYSIS, ROUTINE W REFLEX MICROSCOPIC - Abnormal; Notable for the following  components:   APPearance HAZY (*)    Glucose, UA 150 (*)    Leukocytes,Ua SMALL (*)    Bacteria, UA MANY (*)    All other components within normal limits  CBG MONITORING, ED - Abnormal; Notable for the following components:   Glucose-Capillary 238 (*)    All other components within normal limits  CBG MONITORING, ED - Abnormal; Notable for the following components:   Glucose-Capillary 204 (*)    All other components within normal limits  TROPONIN I (HIGH SENSITIVITY) - Abnormal; Notable for the following components:   Troponin I (High Sensitivity) 23 (*)    All other components within normal limits  TROPONIN I (HIGH SENSITIVITY) - Abnormal; Notable for the following components:   Troponin I (High Sensitivity) 20 (*)    All other components within normal limits  TROPONIN I (HIGH SENSITIVITY) - Abnormal; Notable for the following components:   Troponin I (High Sensitivity) 23 (*)    All other components within normal limits  URINE CULTURE  TROPONIN I (HIGH SENSITIVITY)    MR BRAIN WO CONTRAST  Final Result    CT Head Wo Contrast  Final Result    DG Chest Portable 1 View  Final Result      No follow-ups on file.    Infinity Jeffords, Barbara Cower, MD 10/29/19 [phone]

## 2019-10-29 NOTE — Progress Notes (Signed)
Bilateral lower extremity venous duplex completed. Refer to "CV Proc" under chart review to view preliminary results.  10/29/2019 4:13 PM Eula Fried., MHA, RVT, RDCS, RDMS

## 2019-10-29 NOTE — Progress Notes (Addendum)
STROKE TEAM PROGRESS NOTE   INTERVAL HISTORY His RN is at the bedside.  I also talked with patient daughter over the phone.  As per daughter, patient has confusion last week make it at home, not able to recognize daughter with EMS.  However, patient stated that he was aware of everything, he was not naked he was having short at home which he did not feel inappropriate.  He did not recommend daughter because he was pay attention to the MS not appear attention to the daughter.  On exam, patient awake alert, orientated x3, no focal deficit except left mild dysmetria.  OBJECTIVE Vitals:   10/29/19 0345 10/29/19 0400 10/29/19 0415 10/29/19 0430  BP:  130/76  125/70  Pulse: 65 71 69   Resp: 16 18 (!) 21 17  Temp:      TempSrc:      SpO2: 95% 100% 100%     CBC:  Recent Labs  Lab 10/28/19 1600  WBC 21.3*  NEUTROABS 18.6*  HGB 14.2  HCT 44.1  MCV 86.8  PLT 440    Basic Metabolic Panel:  Recent Labs  Lab 10/28/19 1600  NA 134*  K 4.1  CL 100  CO2 24  GLUCOSE 242*  BUN 7*  CREATININE 1.08  CALCIUM 8.3*    Lipid Panel:     Component Value Date/Time   CHOL 221 (H) 12/21/2017 1609   TRIG 82 10/11/2019 2330   HDL 49.50 12/21/2017 1609   CHOLHDL 4 12/21/2017 1609   VLDL 23.4 12/21/2017 1609   LDLCALC 148 (H) 12/21/2017 1609   HgbA1c:  Lab Results  Component Value Date   HGBA1C 5.8 (H) 10/16/2019   Urine Drug Screen: No results found for: LABOPIA, COCAINSCRNUR, LABBENZ, AMPHETMU, THCU, LABBARB  Alcohol Level No results found for: [facility]  IMAGING  CT Head Wo Contrast 10/28/2019 IMPRESSION:  No acute intracranial abnormality. 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 infarct.   MR BRAIN WO CONTRAST 10/29/2019  IMPRESSION:  Acute to early subacute infarction in the peripheral left midportion of the cerebellum. Mild swelling but no hemorrhage. Old small vessel infarctions of the pons, right thalamus and extensively throughout the cerebral hemispheric  white matter.   CTA Head and Neck 1. 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 2. 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 3. Intracranial atherosclerosis mainly affecting medium size vessels and the siphons.  DG Chest Portable 1 View 10/28/2019 IMPRESSION:  Low lung volumes with vascular crowding and streaky basilar atelectasis. No infiltrates or effusions.   Transthoracic Echocardiogram  00/00/2021 Pending   ECG - SR rate 78 BPM. (See cardiology reading for complete details)   PHYSICAL EXAM  Temp:  [98.2 F (36.8 C)-100.2 F (37.9 C)] 98.2 F (36.8 C) (02/20 0800) Pulse Rate:  [64-87] 64 (02/20 1217) Resp:  [14-23] 19 (02/20 1217) BP: (105-131)/(56-78) 105/74 (02/20 1217) SpO2:  [93 %-100 %] 99 % (02/20 1217)  General - Well nourished, well developed, in no apparent distress.  Ophthalmologic - fundi not visualized due to noncooperation.  Cardiovascular - Regular rhythm and rate.  Mental Status -  Level of arousal and orientation to time, place, and person were intact. Language including expression, naming, repetition, comprehension was assessed and found intact. Fund of Knowledge was assessed and was intact.  Cranial Nerves II - XII - II - Visual field intact OU. III, IV, VI - Extraocular movements intact. V -  Facial sensation intact bilaterally. VII - Facial movement intact bilaterally. VIII - Hearing & vestibular intact bilaterally. X - Palate elevates symmetrically. XI - Chin turning & shoulder shrug intact bilaterally. XII - Tongue protrusion intact.  Motor Strength - The patient's strength was normal in all extremities and pronator drift was absent.  Bulk was normal and fasciculations were absent.   Motor Tone - Muscle tone was assessed at the neck and appendages and was normal.  Reflexes - The patient's reflexes were symmetrical in all extremities  and he had no pathological reflexes.  Sensory - Light touch, temperature/pinprick were assessed and were symmetrical.    Coordination - The patient had normal movements in the right hand and foo feet with no ataxia or dysmetria.  However, mild dysmetria on left finger-to-nose and heel-to-shin.  Tremor was absent.  Gait and Station - deferred.   ASSESSMENT/PLAN Aaron Mendez is a 74 y.o. male with history of sickle cell trait, steroid-induced hyperglycemia, recent Covid pneumonia, hyperlipidemia, and previous strokes by imaging presenting with hyperglycemia, generalized weakness and recent confusion. He did not receive IV t-PA due to late presentation (>4.5 hours from time of onset).   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   MRI head - Subacute infarction in the peripheral left midportion of the cerebellum. Mild swelling but no hemorrhage. Old small vessel infarctions of the pons, right thalamus and extensively throughout the cerebral hemispheric white matter.   CTA H&N - left VA nondominant, left V1 occlusion with thready intermittent reconstitution.  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  Patient counseled to be compliant with his antithrombotic medications  Ongoing aggressive stroke risk factor management  Therapy recommendations:  pending  Disposition:  Pending  COVID infection  Recent admission 2/2 -2/8 at [facility] for Covid pneumonia  As per daughter, patient not completed back to normal yet  2/19 T-max 100.2  CTA neck showed patchy opacity in the apical lungs, recommend Covid  testing  UTI  UA WBC 21-50  WBC 14.7-21.3-19.7  On Rocephin  Urine and blood culture pending  Hyperlipidemia  Home Lipid lowering medication: Lipitor 20 mg daily  LDL 56, goal < 70  Resume Lipitor 20 mg daily  Continue statin at discharge  Diabetes  Home diabetic meds: metformin  Current diabetic meds: SSI and metformin  HgbA1c 5.8, goal < 7.0  CBG monitoring  Other Stroke Risk Factors  Advanced age  Hx stroke/TIA by imaging  Other Active Problems  Code status - DNR  Hyponatremia Na - 134  Hospital day # 0  Neurology will sign off. Please call with questions. Pt will follow up with stroke clinic NP at [facility] in about 4 weeks. Thanks for the consult.   Marvel Plan, MD PhD Stroke Neurology 10/29/2019 7:38 PM   To contact Stroke Continuity provider, please refer to [email or web address]. After hours, contact General Neurology

## 2019-10-29 NOTE — Evaluation (Signed)
Speech Language Pathology Evaluation Patient Details Name: Aaron Mendez MRN: [id number] DOB: [birth year] Today's Date: 10/29/2019 Time: 2536-6440 SLP Time Calculation (min) (ACUTE ONLY): 60 min  Problem List:  Patient Active Problem List   Diagnosis Date Noted  . Acute CVA (cerebrovascular accident) (HCC) 10/29/2019  . Acute lower UTI 10/29/2019  . Hyperglycemia 10/29/2019  . Acute cystitis without hematuria   . Fever due to COVID-19 10/12/2019  . Dehydration 10/12/2019  . Acute bilateral low back pain without sciatica 05/06/2019  . Poison ivy dermatitis 02/04/2019  . Viral URI with cough 06/29/2018  . Erectile dysfunction 06/29/2018  . Encounter for well adult exam with abnormal findings 12/21/2017  . Enlarged prostate on rectal examination 12/21/2017  . Unilateral primary osteoarthritis, left knee 12/21/2017   Past Medical History:  Past Medical History:  Diagnosis Date  . Bilateral primary osteoarthritis of knee   . Sickle cell trait (HCC)    Past Surgical History: History reviewed. No pertinent surgical history. HPI:  Nieko Clarin is a 74 y.o. male with history of hyperlipidemia and glaucoma was recently admitted and discharged on February 8 after being treated for Covid infection. Lives at home alone and usually visited by patient's daughter (daughter is Producer, television/film/video).  Admitted for acute CVA (MRI- left cerebellar stroke), Hyperglycemia, and UTI with sepsis.   Assessment / Plan / Recommendation Clinical Impression  Pt participated in speech/language/cognitive assessment - speech with occasional dysfluencies.  No dysarthria.  Expressive and receptive language are WNL.  Pt with difficulty with tasks of verbal memory and alternating attention.  Orientation WNL.  He expressed feelings of overwhelming grief/loss regarding events of the last few years and months. Provided active listening and encouragement. Offered opportunity to speak with chaplain's service for support and he  enthusiastically accepted.  Contacted the on-call chaplain who will f/u sometime today.  SLP will f/u briefly while pt admitted for further cognitive assessment- suspect that his emotional status today may have impacted performance on cognitive testing.      SLP Assessment  SLP Recommendation/Assessment: Patient needs continued Speech Lanaguage Pathology Services    Follow Up Recommendations  Other (comment)(tba)    Frequency and Duration min 2x/week  1 week      SLP Evaluation Cognition  Overall Cognitive Status: Impaired/Different from baseline Arousal/Alertness: Awake/alert Orientation Level: Oriented X4 Attention: Sustained Sustained Attention: Appears intact Memory: Impaired Memory Impairment: Retrieval deficit;Decreased short term memory Decreased Short Term Memory: Verbal basic Awareness: Appears intact Problem Solving: Appears intact Executive Function: Reasoning Reasoning: Appears intact       Comprehension  Auditory Comprehension Overall Auditory Comprehension: Appears within functional limits for tasks assessed Reading Comprehension Reading Status: Not tested    Expression Verbal Expression Overall Verbal Expression: Appears within functional limits for tasks assessed   Oral / Motor  Motor Speech Overall Motor Speech: Appears within functional limits for tasks assessed   GO                    Blenda Mounts Laurice 10/29/2019, 3:30 PM  Gean Laursen L. Samson Frederic, MA CCC/SLP Acute Rehabilitation Services Office number [phone] Pager [phone]

## 2019-10-29 NOTE — Consult Note (Signed)
Referring Physician: Dr. Toniann Fail    Chief Complaint: Slurred speech and "taliking out of his head"  HPI: Aaron Mendez is an 74 y.o. male presenting from home with generalized weakness and hyperglycemia. Recently was discharged from [facility] (Feb 8) after admission there for Covid PNA. Of note, he was diagnosed at that time with steroid-induced hyperglycemia (he has no history of diabetes).    On initial ED evaluation this presentation, he endorsed feeling weak all over in the context of CBGs in the 200-300 range at home. He has not been taking any meds since he was discharged. He denied fever, focal weakness, numbness, trouble with speech, vision changes, CP, abdominal pain, nausea or vomiting.   "Discussed with patient's daughter [name] by phone.  She states patient has had confusion for the better part of the week.  Today EMS was called because he was "talking out of his head," slurring his words and walking around the house naked which is atypical for him.  Daughter concerned he has undiagnosed dementia.  She states she did fill his Metformin and Decadron which she has been taking."    Past Medical History:  Diagnosis Date  . Bilateral primary osteoarthritis of knee   . Sickle cell trait (HCC)     History reviewed. No pertinent surgical history.  Family History  Problem Relation Age of Onset  . Sickle cell anemia Son    Social History:  reports that he has never smoked. He has never used smokeless tobacco. He reports that he does not drink alcohol or use drugs.  Allergies: No Known Allergies  Home Medications:  Not currently taking any medications  ROS: As per HPI. Comprehensive ROS otherwise negative.   Physical Examination: Blood pressure 120/73, pulse 71, temperature 100.2 F (37.9 C), temperature source Oral, resp. rate 18, SpO2 95 %.  HEENT: [place]/AT Lungs: Respirations unlabored Ext: No edema  Neurologic Examination: Mental Status: Awake with mildly decreased level of  alertness. Increased latencies of verbal and motor responses. [place]. Speech is sparse but fluent without dysarthria. Able to name 2 out of 3 common objects but responses are delayed. Able to follow all motor commands. Has delayed responses to orientation questions and misses 2/5.  Cranial Nerves: II:  Visual fields intact with no extinction to DSS. PERRL.  III,IV, VI: No ptosis. EOM intact.   V,VII: Smile symmetric, facial temp sensation equal bilaterally VIII: hearing intact to voice IX,X: No hypophonia XI: Symmetric XII: midline tongue extension  Motor: 5/5 proximal and distal upper and lower extremities without asymmetry. Increased latencies of motor responses.  Sensory: Decreased temp sensation to RUE and RLE. Positive for extinction to DSS RLE.  Deep Tendon Reflexes:  2+ bilateral brachioradialis and biceps 3+ patellae bilaterally.  Plantars: Right: downgoing   Left: downgoing Cerebellar: No ataxia with FNF or H-S bilaterally, but with mildly bradykinetic movements. No rest or action tremor.  Gait: Deferred  Results for orders placed or performed during the hospital encounter of 10/28/19 (from the past 48 hour(s))  CBG monitoring, ED     Status: Abnormal   Collection Time: 10/28/19  3:32 PM  Result Value Ref Range   Glucose-Capillary 238 (H) 70 - 99 mg/dL  CBC with Differential/Platelet     Status: Abnormal   Collection Time: 10/28/19  4:00 PM  Result Value Ref Range   WBC 21.3 (H) 4.0 - 10.5 K/uL   RBC 5.08 4.22 - 5.81 MIL/uL   Hemoglobin 14.2 13.0 - 17.0 g/dL   HCT 17.6 16.0 -  52.0 %   MCV 86.8 80.0 - 100.0 fL   MCH 28.0 26.0 - 34.0 pg   MCHC 32.2 30.0 - 36.0 g/dL   RDW 12.4 11.5 - 15.5 %   Platelets 158 150 - 400 K/uL   nRBC 0.0 0.0 - 0.2 %   Neutrophils Relative % 87 %   Neutro Abs 18.6 (H) 1.7 - 7.7 K/uL   Lymphocytes Relative 4 %   Lymphs Abs 0.8 0.7 - 4.0 K/uL   Monocytes Relative 8 %   Monocytes Absolute 1.7 (H) 0.1 - 1.0 K/uL   Eosinophils Relative 0 %    Eosinophils Absolute 0.0 0.0 - 0.5 K/uL   Basophils Relative 0 %   Basophils Absolute 0.0 0.0 - 0.1 K/uL   Immature Granulocytes 1 %   Abs Immature Granulocytes 0.10 (H) 0.00 - 0.07 K/uL    Comment: Performed at [facility] [address] [place], [place] [address]  Comprehensive metabolic panel     Status: Abnormal   Collection Time: 10/28/19  4:00 PM  Result Value Ref Range   Sodium 134 (L) 135 - 145 mmol/L   Potassium 4.1 3.5 - 5.1 mmol/L   Chloride 100 98 - 111 mmol/L   CO2 24 22 - 32 mmol/L   Glucose, Bld 242 (H) 70 - 99 mg/dL   BUN 7 (L) 8 - 23 mg/dL   Creatinine, Ser 1.08 0.61 - 1.24 mg/dL   Calcium 8.3 (L) 8.9 - 10.3 mg/dL   Total Protein 6.4 (L) 6.5 - 8.1 g/dL   Albumin 3.2 (L) 3.5 - 5.0 g/dL   AST 19 15 - 41 U/L   ALT 24 0 - 44 U/L   Alkaline Phosphatase 56 38 - 126 U/L   Total Bilirubin 0.8 0.3 - 1.2 mg/dL   GFR calc non Af Amer >60 >60 mL/min   GFR calc Af Amer >60 >60 mL/min   Anion gap 10 5 - 15    Comment: Performed at [facility], [place] [address], [place], [place] [address]  Troponin I (High Sensitivity)     Status: None   Collection Time: 10/28/19  4:00 PM  Result Value Ref Range   Troponin I (High Sensitivity) 16 <18 ng/L    Comment: (NOTE) Elevated high sensitivity troponin I (hsTnI) values and significant  changes across serial measurements may suggest ACS but many other  chronic and acute conditions are known to elevate hsTnI results.  Refer to the "Links" section for chest pain algorithms and additional  guidance. Performed at [facility], [place] [address], [place], [place] [address]   Urinalysis, Routine w reflex microscopic     Status: Abnormal   Collection Time: 10/28/19  5:33 PM  Result Value Ref Range   Color, Urine YELLOW YELLOW   APPearance HAZY (A) CLEAR   Specific Gravity, Urine 1.011 1.005 - 1.030   pH 7.0 5.0 - 8.0   Glucose, UA 150 (A) NEGATIVE mg/dL   Hgb urine dipstick NEGATIVE NEGATIVE   Bilirubin Urine NEGATIVE  NEGATIVE   Ketones, ur NEGATIVE NEGATIVE mg/dL   Protein, ur NEGATIVE NEGATIVE mg/dL   Nitrite NEGATIVE NEGATIVE   Leukocytes,Ua SMALL (A) NEGATIVE   RBC / HPF 0-5 0 - 5 RBC/hpf   WBC, UA 21-50 0 - 5 WBC/hpf   Bacteria, UA MANY (A) NONE SEEN    Comment: Performed at [facility] [address], [place], [place] [address]  CBG monitoring, ED  Status: Abnormal   Collection Time: 10/28/19  7:39 PM  Result Value Ref Range   Glucose-Capillary 204 (H) 70 - 99 mg/dL  Troponin I (High Sensitivity)     Status: Abnormal   Collection Time: 10/28/19  7:46 PM  Result Value Ref Range   Troponin I (High Sensitivity) 23 (H) <18 ng/L    Comment: (NOTE) Elevated high sensitivity troponin I (hsTnI) values and significant  changes across serial measurements may suggest ACS but many other  chronic and acute conditions are known to elevate hsTnI results.  Refer to the "Links" section for chest pain algorithms and additional  guidance. Performed at [facility], [address], [place], [place] [address]   Troponin I (High Sensitivity)     Status: Abnormal   Collection Time: 10/28/19  9:33 PM  Result Value Ref Range   Troponin I (High Sensitivity) 20 (H) <18 ng/L    Comment: (NOTE) Elevated high sensitivity troponin I (hsTnI) values and significant  changes across serial measurements may suggest ACS but many other  chronic and acute conditions are known to elevate hsTnI results.  Refer to the "Links" section for chest pain algorithms and additional  guidance. Performed at [facility], [address], [place], [place] [address]   Troponin I (High Sensitivity)     Status: Abnormal   Collection Time: 10/28/19 11:33 PM  Result Value Ref Range   Troponin I (High Sensitivity) 23 (H) <18 ng/L    Comment: (NOTE) Elevated high sensitivity troponin I (hsTnI) values and significant  changes across serial measurements may suggest ACS but many other  chronic and acute conditions are  known to elevate hsTnI results.  Refer to the "Links" section for chest pain algorithms and additional  guidance. Performed at [facility], [address] [place], [place] [address]    CT Head Wo Contrast  Result Date: 10/28/2019 CLINICAL DATA:  Recent onset memory difficulty. EXAM: CT HEAD WITHOUT CONTRAST TECHNIQUE: Contiguous axial images were obtained from the base of the skull through the vertex without intravenous contrast. COMPARISON:  None. FINDINGS: Brain: No evidence of acute infarction, hemorrhage, hydrocephalus, extra-axial collection or mass lesion/mass effect. There is chronic microvascular ischemic change of hypoattenuation seen in the subcortical and periventricular deep white matter. Remote left cerebellar infarct is noted. Vascular: Extensive atherosclerosis. Skull: Intact.  No focal lesion. Sinuses/Orbits: Status post maxillary antrostomy. Otherwise negative. Other: None. IMPRESSION: No acute intracranial abnormality. 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 infarct. Electronically Signed   By: Drusilla Kanner M.D.   On: 10/28/2019 19:14   MR BRAIN WO CONTRAST  Result Date: 10/29/2019 CLINICAL DATA:  Generalized weakness.  Coronavirus infection. EXAM: MRI HEAD WITHOUT CONTRAST TECHNIQUE: Multiplanar, multiecho pulse sequences of the brain and surrounding structures were obtained without intravenous contrast. COMPARISON:  Head CT 10/28/2019 FINDINGS: Brain: Acute/early subacute infarction of the left peripheral midportion of the cerebellum. No other acute intracranial finding. 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 Cerebral hemispheres show chronic small-vessel ischemic changes of a moderate to marked degree throughout the white matter. Old lacunar infarction right thalamus. No mass, hemorrhage, hydrocephalus or extra-axial collection. Many of the sequences are degraded by motion. Vascular: Major vessels at the base of the brain show flow. Skull and  upper cervical spine: Negative Sinuses/Orbits: Clear/normal Other: None IMPRESSION: Acute to early subacute infarction in the peripheral left midportion of the cerebellum. Mild swelling but no hemorrhage. 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 Electronically Signed   By: Paulina Fusi M.D.   On: 10/29/2019 02:39   DG Chest Portable 1 View  Result Date: 10/28/2019 CLINICAL DATA:  Altered mental status. EXAM: PORTABLE CHEST 1 VIEW COMPARISON:  10/11/2019 FINDINGS: The cardiac silhouette, mediastinal and hilar contours are within normal limits and stable. Slightly low lung volumes with mild vascular crowding and streaky basilar atelectasis. No infiltrates, edema or effusions. The bony thorax is intact. IMPRESSION: Low lung volumes with vascular crowding and streaky basilar atelectasis. No infiltrates or effusions. Electronically Signed   By: Rudie Meyer M.D.   On: 10/28/2019 16:26    Assessment: 74 y.o. male with medium-sized, subacute left cerebellar ischemic infarction.  1. Exam reveals no ataxia despite the location of the subacute infarction. Cognitive findings are suggestive of possible dementia.  2. In addition to the subacute infarction seen on MRI, old small vessel infarctions of the pons and right thalamus are noted in conjunction with chronic small vessel ischemic changes and mild diffuse cerebral atrophy.  3. Stroke Risk Factors - Sickle cell trait  Recommendations: 1. HgbA1c, fasting lipid panel 2. MRA of the brain without contrast 3. PT consult, OT consult, Speech consult 4. Echocardiogram 5. Carotid dopplers 6. Prophylactic therapy- Start ASA 81 mg po qd 7. Risk factor modification 8. Telemetry monitoring 9. Frequent neuro checks 10. Out of permissive HTN time window.    @Electronically  signed: Dr.  10/29/2019, 4:30 AM

## 2019-10-29 NOTE — Progress Notes (Signed)
Patient arrived to 3W07 Alert and oriented x4. Denies pain. POC provided to patient. Call bell within reach.

## 2019-10-29 NOTE — TOC Initial Note (Signed)
Transition of Care [facility]) - Initial/Assessment Note    Patient Details  Name: Aaron Mendez MRN: [id number] Date of Birth: Apr 20, 1946  Transition of [facility] CM/SW Contact:    Lawerance Sabal, RN Phone Number: 10/29/2019, 4:00 PM  Clinical Narrative:                 Sherron Monday to patient and daughter [name]. They are agreeable to [facility]. Patient active w [facility], will place resumption orders for RN HHA PT OT SW Patient may DC to daughter's house at [address] [place] [address].  Daughter requested new script for Metformin at DC. I provided her with Good Rx app so she can use that if his insurance is not able to cover a second fill this month.  Patient has RW and needed DME at home already.    Expected Discharge Plan: Home w Home Health Services Barriers to Discharge: Continued Medical Work up   Patient Goals and CMS Choice Patient states their goals for this hospitalization and ongoing recovery are:: to go home CMS Medicare.gov Compare Post Acute Care list provided to:: Patient Choice offered to / list presented to : Patient  Expected Discharge Plan and Services Expected Discharge Plan: Home w Home Health Services                                   HH Arranged: PT, OT, RN, Nurse's Aide, Disease Management HH Agency: [facility] Date [facility] Agency Contacted: 10/29/19 Time HH Agency Contacted: 1600 Representative spoke with at [facility] Agency: Kandee Keen  Prior Living Arrangements/Services                       Activities of Daily Living      Permission Sought/Granted                  Emotional Assessment              Admission diagnosis:  Hyperglycemia [R73.9] Acute cystitis without hematuria [N30.00] Acute CVA (cerebrovascular accident) (HCC) [I63.9] Cerebrovascular accident (CVA), unspecified mechanism (HCC) [I63.9] Patient Active Problem List   Diagnosis Date Noted  . Acute CVA (cerebrovascular accident) (HCC) 10/29/2019  . Acute lower UTI  10/29/2019  . Hyperglycemia 10/29/2019  . Acute cystitis without hematuria   . Fever due to COVID-19 10/12/2019  . Dehydration 10/12/2019  . Acute bilateral low back pain without sciatica 05/06/2019  . Poison ivy dermatitis 02/04/2019  . Viral URI with cough 06/29/2018  . Erectile dysfunction 06/29/2018  . Encounter for well adult exam with abnormal findings 12/21/2017  . Enlarged prostate on rectal examination 12/21/2017  . Unilateral primary osteoarthritis, left knee 12/21/2017   PCP:  Everrett Coombe, DO Pharmacy:   [id number] - [place], [place] - [address] AT [facility] [address] [place] [address] Phone: [phone] Fax: [phone]     Social Determinants of Health (SDOH) Interventions    Readmission Risk Interventions No flowsheet data found.

## 2019-10-29 NOTE — Evaluation (Signed)
Physical Therapy Evaluation Patient Details Name: Aaron Mendez MRN: [id number] DOB: 1945-12-14 Today's Date: 10/29/2019   History of Present Illness  Aaron Mendez is a 74 y.o. male with history of hyperlipidemia and glaucoma was recently admitted and discharged on February 8 after being treated for Covid infection lives at home alone and usually visited by patient's daughter.  Admitted for acute CVA, Hyperglycemia, and UTI.  Clinical Impression  Pt admitted with above diagnosis. Min assist for bed mobility however supervision to  Min guard with transfer and gait using RW for light support. Feels he is near baseline today and feeling much better. States he lives with a roommate and daughter checks on him regularly. Will continue to follow and progress during admission. HHPT to assess home environment for safety concerns and progress gait and strength back to full independence as he did not require device PTA a couple of weeks ago during [place] admission. Pt currently with functional limitations due to the deficits listed below (see PT Problem List). Pt will benefit from skilled PT to increase their independence and safety with mobility to allow discharge to the venue listed below.       Follow Up Recommendations Home health PT;Supervision - Intermittent    Equipment Recommendations  None recommended by PT    Recommendations for Other Services       Precautions / Restrictions Precautions Precautions: Fall Restrictions Weight Bearing Restrictions: No      Mobility  Bed Mobility Overal bed mobility: Needs Assistance Bed Mobility: Supine to Sit     Supine to sit: Min assist     General bed mobility comments: Slow to rise, min assist for hand to pull through to EOB, cues for technique.  Transfers Overall transfer level: Needs assistance Equipment used: Rolling walker (2 wheeled) Transfers: Sit to/from Stand Sit to Stand: Min guard         General transfer comment: Min guard  for safety. Good strength and stability once upright with RW for support lightly as needed. Cues for technique.  Ambulation/Gait Ambulation/Gait assistance: Supervision Gait Distance (Feet): 50 Feet Assistive device: Rolling walker (2 wheeled) Gait Pattern/deviations: Step-through pattern;Decreased stride length Gait velocity: decreased   General Gait Details: Slow but steady with decreased gait speed. Feels stable with RW, no overt instability noted with device. Cues for walker placement when turning to sit in transport chair.  Stairs            Wheelchair Mobility    Modified Rankin (Stroke Patients Only) Modified Rankin (Stroke Patients Only) Pre-Morbid Rankin Score: No significant disability Modified Rankin: Moderate disability     Balance Overall balance assessment: Mild deficits observed, not formally tested                                           Pertinent Vitals/Pain Pain Assessment: No/denies pain    Home Living Family/patient expects to be discharged to:: Private residence Living Arrangements: Non-relatives/Friends Available Help at Discharge: Family;Friend(s);Available PRN/intermittently Type of Home: House Home Access: Stairs to enter Entrance Stairs-Rails: Left Entrance Stairs-Number of Steps: 3 Home Layout: One level Home Equipment: Walker - 2 wheels;Bedside commode;Transport chair Additional Comments: States he lives with "a lady" and daughter checks on him.(Daughter visits regularly to check)    Prior Function Level of Independence: Independent         Comments: Driving and working sometimes  Hand Dominance   Dominant Hand: Right    Extremity/Trunk Assessment   Upper Extremity Assessment Upper Extremity Assessment: Defer to OT evaluation    Lower Extremity Assessment Lower Extremity Assessment: Generalized weakness       Communication   Communication: No difficulties  Cognition Arousal/Alertness:  Awake/alert Behavior During Therapy: WFL for tasks assessed/performed Overall Cognitive Status: Within Functional Limits for tasks assessed                                 General Comments: A&O x4      General Comments General comments (skin integrity, edema, etc.): Transport arrived to take to CT. Pt states he feels much better today. VSS during observation.    Exercises     Assessment/Plan    PT Assessment Patient needs continued PT services  PT Problem List Decreased strength;Decreased activity tolerance;Decreased balance;Decreased mobility;Decreased knowledge of use of DME       PT Treatment Interventions DME instruction;Gait training;Stair training;Functional mobility training;Therapeutic activities;Therapeutic exercise;Balance training;Neuromuscular re-education    PT Goals (Current goals can be found in the Care Plan section)  Acute Rehab PT Goals Patient Stated Goal: Return home PT Goal Formulation: With patient Time For Goal Achievement: 11/12/19 Potential to Achieve Goals: Good    Frequency Min 4X/week   Barriers to discharge Decreased caregiver support lives with roommate although states roommate can assist with housework if needed. Daughter visits regularly.    Co-evaluation               AM-PAC PT "6 Clicks" Mobility  Outcome Measure Help needed turning from your back to your side while in a flat bed without using bedrails?: None Help needed moving from lying on your back to sitting on the side of a flat bed without using bedrails?: A Little Help needed moving to and from a bed to a chair (including a wheelchair)?: A Little Help needed standing up from a chair using your arms (e.g., wheelchair or bedside chair)?: A Little Help needed to walk in hospital room?: A Little Help needed climbing 3-5 steps with a railing? : A Little 6 Click Score: 19    End of Session Equipment Utilized During Treatment: Gait belt Activity Tolerance: Patient  tolerated treatment well Patient left: Other (comment)(Transport chair with transport team) Nurse Communication: Mobility status PT Visit Diagnosis: Muscle weakness (generalized) (M62.81);Other symptoms and signs involving the nervous system (R29.898);Other abnormalities of gait and mobility (R26.89)    Time: 7673-4193 PT Time Calculation (min) (ACUTE ONLY): 22 min   Charges:   PT Evaluation $PT Eval Low Complexity: 1 Low          Charlsie Merles, PT Acute Rehabilitation Services  [place] 10/29/2019, 10:55 AM

## 2019-10-29 NOTE — ED Notes (Signed)
Pt transported to MRI 

## 2019-10-29 NOTE — Progress Notes (Signed)
PROGRESS NOTE  Aaron Mendez HBZ:[id number] DOB: [birth year] DOA: 10/28/2019 PCP: Luetta Nutting, DO  HPI/Recap of past 24 hours: HPI from Dr Filiberto Pinks Aaron Mendez is a 74 y.o. male with history of hyperlipidemia and glaucoma was recently admitted and discharged on February 8, 2 weeks ago after being treated for Covid infection lives at home alone and usually visited by patient's daughter was found to be increasingly confused and worsening confusion over the last 1 week. PTA, patient's daughter went to check on the patient, met pt walking naked and and was not able to recognize her. No new changes in medications except for patient was prescribed Metformin and was on Decadron which is not sure if patient has taken it. In the ED, pt alert awake, oriented to his name moves all extremities and MRI brain shows left cerebellar stroke subacute. Neurologist Dr. Cheral Marker has been consulted.  In addition patient's labs also show WBC count of 21.3 patient is febrile 100.4 degrees temperature and UA is consistent with UTI.  Chest x-ray does not show any infiltrates.  EKG shows normal sinus rhythm.  Patient admitted for CVA with fever likely from UTI and acute encephalopathy likely from both.     Today, patient alert, awake, oriented to self, still appears to be somewhat disoriented, able to follow commands.  Patient denies any chest pain, shortness of breath, abdominal pain, nausea/vomiting, fever/chills.   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  No infiltrates or effusions Continue IV ceftriaxone Monitor closely on telemetry  Acute metabolic encephalopathy Likely due to above Management as above  ??Type 2 diabetes mellitus May be worsened by recent steroid use A1c 5.8 on 10/16/2019, currently 8.3 on 10/29/2019 Recently on steroid for Covid infection SSI, Accu-Cheks, hypoglycemic protocol  Hyperlipidemia Continue statins  Recent COVID-19 infection Tested Covid positive on 10/11/2019 No respiratory symptoms, currently not requiring oxygen Monitor closely        Malnutrition Type:      Malnutrition Characteristics:      Nutrition Interventions:       Estimated body mass index is 28.27 kg/m as calculated from the following:   Height as of 10/19/19: _0  (1.778 m).   Weight as of 10/19/19: 89.4 kg.     Code Status: DNR  Family Communication: None at bedside  Disposition Plan: To be determined, once clinical improvement, awaiting PT/OT    Consultants:  Neurology  Procedures:  None  Antimicrobials:  Ceftriaxone  DVT prophylaxis: Lovenox   Objective: Vitals:   10/29/19 0600 10/29/19 0615 10/29/19 0630 10/29/19 0800  BP: 114/67  121/71 106/77  Pulse:    67  Resp: _1 Temp:    98.2 F (36.8 C)  TempSrc:    Oral  SpO2:    96%    Intake/Output Summary (Last 24 hours) at 10/29/2019 1020 Last data filed at 10/28/2019 2025 Gross per 24 hour  Intake 975 ml  Output --  Net 975 ml   There were no vitals filed for this visit.  Exam:  General: NAD, ill-appearing  Cardiovascular: S1, S2 present  Respiratory: CTAB  Abdomen: Soft, nontender, nondistended, bowel sounds present  Musculoskeletal: No bilateral pedal edema noted  Skin: Normal  Psychiatry:  Normal mood  Neurology: Strength equal in all extremities, no obvious focal neurologic deficits noted   Data Reviewed: CBC: Recent Labs  Lab 10/28/19 1600 10/29/19 0809  WBC 21.3* 19.7*  NEUTROABS 18.6*  --   HGB 14.2 13.0  HCT 44.1 40.0  MCV 86.8 86.8  PLT 158 510*   Basic Metabolic Panel: Recent Labs  Lab 10/28/19 1600 10/29/19 0809  NA 134*  --   K 4.1  --   CL 100  --   CO2 24  --   GLUCOSE 242*  --   BUN 7*  --   CREATININE 1.08 0.97  CALCIUM 8.3*  --    GFR: Estimated Creatinine Clearance: 75.2 mL/min (by C-G formula based on SCr of 0.97 mg/dL). Liver Function Tests: Recent Labs  Lab 10/28/19 1600  AST 19  ALT 24  ALKPHOS 56  BILITOT 0.8  PROT 6.4*  ALBUMIN 3.2*   No results for input(s): LIPASE, AMYLASE in the last 168 hours. No results for input(s): AMMONIA in the last 168 hours. Coagulation Profile: No results for input(s): INR, PROTIME in the last 168 hours. Cardiac Enzymes: No results for input(s): CKTOTAL, CKMB, CKMBINDEX, TROPONINI in the last 168 hours. BNP (last 3 results) No results for input(s): PROBNP in the last 8760 hours. HbA1C: Recent Labs    10/29/19 0809  HGBA1C 8.3*   CBG: Recent Labs  Lab 10/28/19 1532 10/28/19 1939 10/29/19 0827  GLUCAP 238* 204* 186*   Lipid Profile: Recent Labs    10/29/19 0809  CHOL 114  HDL 46  LDLCALC 56  TRIG 59  CHOLHDL 2.5   Thyroid Function Tests: No results for input(s): TSH, T4TOTAL, FREET4, T3FREE, THYROIDAB in the last 72 hours. Anemia Panel: No results for input(s): VITAMINB12, FOLATE, FERRITIN, TIBC, IRON, RETICCTPCT in the last 72 hours. Urine analysis:    Component Value Date/Time   COLORURINE YELLOW 10/28/2019 1733   APPEARANCEUR HAZY (A) 10/28/2019 1733   LABSPEC 1.011 10/28/2019 1733   PHURINE 7.0 10/28/2019 1733   GLUCOSEU 150 (A) 10/28/2019 1733   HGBUR NEGATIVE 10/28/2019 1733   BILIRUBINUR NEGATIVE 10/28/2019 1733   KETONESUR NEGATIVE 10/28/2019 1733    PROTEINUR NEGATIVE 10/28/2019 1733   NITRITE NEGATIVE 10/28/2019 1733   LEUKOCYTESUR SMALL (A) 10/28/2019 1733   Sepsis Labs: _0 (procalcitonin:4,lacticidven:4)  )No results found for this or any previous visit (from the past 240 hour(s)).    Studies: CT Head Wo Contrast  Result Date: 10/28/2019 CLINICAL DATA:  Recent onset memory difficulty. EXAM: CT HEAD WITHOUT CONTRAST TECHNIQUE: Contiguous axial images were obtained from the base of the skull through the vertex without intravenous contrast. COMPARISON:  None. FINDINGS: Brain: No evidence of acute infarction, hemorrhage, hydrocephalus, extra-axial collection or mass lesion/mass effect. There is chronic microvascular ischemic change of hypoattenuation seen in the subcortical and periventricular deep white matter. Remote left cerebellar infarct is noted. Vascular: Extensive atherosclerosis. Skull: Intact.  No focal lesion. Sinuses/Orbits: Status post maxillary antrostomy. Otherwise negative. Other: None. IMPRESSION: No acute intracranial abnormality. 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 infarct. Electronically Signed   By: Inge Rise M.D.   On: 10/28/2019 19:14   MR BRAIN WO CONTRAST  Result Date: 10/29/2019  CLINICAL DATA:  Generalized weakness.  Coronavirus infection. EXAM: MRI HEAD WITHOUT CONTRAST TECHNIQUE: Multiplanar, multiecho pulse sequences of the brain and surrounding structures were obtained without intravenous contrast. COMPARISON:  Head CT 10/28/2019 FINDINGS: Brain: Acute/early subacute infarction of the left peripheral midportion of the cerebellum. No other acute intracranial finding. 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 Cerebral hemispheres show chronic small-vessel ischemic changes of a moderate to marked degree throughout the white matter. Old lacunar infarction right thalamus. No mass, hemorrhage, hydrocephalus or extra-axial collection. Many of the sequences are degraded by motion.  Vascular: Major vessels at the base of the brain show flow. Skull and upper cervical spine: Negative Sinuses/Orbits: Clear/normal Other: None IMPRESSION: Acute to early subacute infarction in the peripheral left midportion of the cerebellum. Mild swelling but no hemorrhage. Old small vessel infarctions of the pons, right thalamus and extensively throughout the cerebral hemispheric white matter. Electronically Signed   By: Nelson Chimes M.D.   On: 10/29/2019 02:39   DG Chest Portable 1 View  Result Date: 10/28/2019 CLINICAL DATA:  Altered mental status. EXAM: PORTABLE CHEST 1 VIEW COMPARISON:  10/11/2019 FINDINGS: The cardiac silhouette, mediastinal and hilar contours are within normal limits and stable. Slightly low lung volumes with mild vascular crowding and streaky basilar atelectasis. No infiltrates, edema or effusions. The bony thorax is intact. IMPRESSION: Low lung volumes with vascular crowding and streaky basilar atelectasis. No infiltrates or effusions. Electronically Signed   By: Marijo Sanes M.D.   On: 10/28/2019 16:26    Scheduled Meds: . aspirin  300 mg Rectal Daily   Or  . aspirin  325 mg Oral Daily  . atorvastatin  20 mg Oral q1800  . brimonidine  1 drop Both Eyes BID  . enoxaparin (LOVENOX) injection  40 mg Subcutaneous Q24H  . insulin aspart  0-9 Units Subcutaneous TID WC    Continuous Infusions: . sodium chloride 75 mL/hr at 10/29/19 0840  . cefTRIAXone (ROCEPHIN)  IV       LOS: 0 days     Alma Friendly, MD [facility]  If 7PM-7AM, please contact night-coverage [email or web address] 10/29/2019, 10:20 AM

## 2019-10-29 NOTE — Progress Notes (Signed)
Responded to page from SLP indicating Mr. Aaron Mendez actively grieving from the many losses he has experienced.  Initiated a relationship of care and concern.  Employed active listening as Mr. Aaron Mendez talked about what is on his heart.  Affirmed his faith and acknowledged how his faith is what keeps him going at times.  Offered prayer at bedside.  Plan to revisit Mr. Aaron Mendez again.  Vernell Morgans Chaplain Resident

## 2019-10-29 NOTE — ED Notes (Signed)
Pt transported back from MRI

## 2019-10-30 DIAGNOSIS — U071 COVID-19: Secondary | ICD-10-CM

## 2019-10-30 DIAGNOSIS — E785 Hyperlipidemia, unspecified: Secondary | ICD-10-CM

## 2019-10-30 DIAGNOSIS — N390 Urinary tract infection, site not specified: Secondary | ICD-10-CM

## 2019-10-30 LAB — BASIC METABOLIC PANEL
Anion gap: 9 (ref 5–15)
BUN: 11 mg/dL (ref 8–23)
CO2: 21 mmol/L — ABNORMAL LOW (ref 22–32)
Calcium: 7.9 mg/dL — ABNORMAL LOW (ref 8.9–10.3)
Chloride: 104 mmol/L (ref 98–111)
Creatinine, Ser: 0.97 mg/dL (ref 0.61–1.24)
GFR calc Af Amer: >60 mL/min (ref >60)
GFR calc non Af Amer: >60 mL/min (ref >60)
Glucose, Bld: 215 mg/dL — ABNORMAL HIGH (ref 70–99)
Potassium: 4.2 mmol/L (ref 3.5–5.1)
Sodium: 134 mmol/L — ABNORMAL LOW (ref 135–145)

## 2019-10-30 LAB — CBC WITH DIFFERENTIAL/PLATELET
Abs Immature Granulocytes: 0.1 10*3/uL — ABNORMAL HIGH (ref 0.00–0.07)
Basophils Absolute: 0 10*3/uL (ref 0.0–0.1)
Basophils Relative: 0 %
Eosinophils Absolute: 0.1 10*3/uL (ref 0.0–0.5)
Eosinophils Relative: 1 %
HCT: 38.9 % — ABNORMAL LOW (ref 39.0–52.0)
Hemoglobin: 12.8 g/dL — ABNORMAL LOW (ref 13.0–17.0)
Immature Granulocytes: 1 %
Lymphocytes Relative: 10 %
Lymphs Abs: 1.1 10*3/uL (ref 0.7–4.0)
MCH: 28.1 pg (ref 26.0–34.0)
MCHC: 32.9 g/dL (ref 30.0–36.0)
MCV: 85.3 fL (ref 80.0–100.0)
Monocytes Absolute: 1.2 10*3/uL — ABNORMAL HIGH (ref 0.1–1.0)
Monocytes Relative: 11 %
Neutro Abs: 8.7 10*3/uL — ABNORMAL HIGH (ref 1.7–7.7)
Neutrophils Relative %: 77 %
Platelets: 108 10*3/uL — ABNORMAL LOW (ref 150–400)
RBC: 4.56 MIL/uL (ref 4.22–5.81)
RDW: 12.6 % (ref 11.5–15.5)
WBC: 11.3 10*3/uL — ABNORMAL HIGH (ref 4.0–10.5)
nRBC: 0 % (ref 0.0–0.2)

## 2019-10-30 LAB — GLUCOSE, CAPILLARY
Glucose-Capillary: 181 mg/dL — ABNORMAL HIGH (ref 70–99)
Glucose-Capillary: 158 mg/dL — ABNORMAL HIGH (ref 70–99)
Glucose-Capillary: 141 mg/dL — ABNORMAL HIGH (ref 70–99)

## 2019-10-30 LAB — RAPID URINE DRUG SCREEN, HOSP PERFORMED
Amphetamines: NOT DETECTED
Barbiturates: NOT DETECTED
Benzodiazepines: NOT DETECTED
Cocaine: NOT DETECTED
Opiates: NOT DETECTED
Tetrahydrocannabinol: NOT DETECTED

## 2019-10-30 LAB — URINE CULTURE: Culture: >=100000 — AB

## 2019-10-30 NOTE — Progress Notes (Signed)
PROGRESS NOTE  Aaron Mendez KZL:[id number] DOB: 11-17-1945 DOA: 10/28/2019 PCP: Luetta Nutting, DO  HPI/Recap of past 24 hours: HPI from Dr Filiberto Pinks Bozman is a 74 y.o. male with history of hyperlipidemia and glaucoma was recently admitted and discharged on February 8, 2 weeks ago after being treated for Covid infection lives at home alone and usually visited by patient's daughter was found to be increasingly confused and worsening confusion over the last 1 week. PTA, patient's daughter went to check on the patient, met pt walking naked and and was not able to recognize her. No new changes in medications except for patient was prescribed Metformin and was on Decadron which is not sure if patient has taken it. In the ED, pt alert awake, oriented to his name moves all extremities and MRI brain shows left cerebellar stroke subacute. Neurologist Dr. Cheral Marker has been consulted.  In addition patient's labs also show WBC count of 21.3 patient is febrile 100.4 degrees temperature and UA is consistent with UTI.  Chest x-ray does not show any infiltrates.  EKG shows normal sinus rhythm.  Patient admitted for CVA with fever likely from UTI and acute encephalopathy likely from both.     Today, patient noted to be have generalized weakness with decreased activity tolerance.  Patient denies chest pain, shortness of breath, abdominal pain, nausea/vomiting, fever/chills.   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  No infiltrates or effusions Continue IV ceftriaxone Monitor closely on telemetry  Acute metabolic encephalopathy Likely due to above Management as above  ??Type 2 diabetes mellitus May be worsened by recent steroid use A1c 5.8 on 10/16/2019, currently 8.3 on 10/29/2019 Recently on steroid for Covid infection SSI, Accu-Cheks, hypoglycemic protocol  Hyperlipidemia Continue statins  Recent COVID-19 infection Tested Covid positive on 10/11/2019 No respiratory symptoms, currently not requiring oxygen Monitor closely        Malnutrition Type:      Malnutrition Characteristics:      Nutrition Interventions:       Estimated body mass index is 28.27 kg/m as calculated from the following:   Height as of 10/19/19: _0  (1.778 m).   Weight as of 10/19/19: 89.4 kg.     Code Status: DNR  Family Communication: Discussed with the daughter who is a Electrical engineer here at [facility] on 10/30/2019  Disposition Plan: Likely home 10/31/2019, once significant clinical improvement    Consultants:  Neurology  Procedures:  None  Antimicrobials:  Ceftriaxone  DVT prophylaxis: Lovenox   Objective: Vitals:   10/30/19 0749 10/30/19 1151 10/30/19 1528 10/30/19 1529  BP: 129/75 111/76 125/68   Pulse: 68 68 (!) 55 (!) 56  Resp: 14 17 (!) 22 17  Temp: 98.4 F (36.9 C) (!) 97.4 F (36.3 C) 98.7 F (37.1 C)  TempSrc: Oral Oral Oral   SpO2: 95% 96% 97% 97%    Intake/Output Summary (Last 24 hours) at 10/30/2019 1725 Last data filed at 10/30/2019 1533 Gross per 24 hour  Intake --  Output 2820 ml  Net -2820 ml   There were no vitals filed for this  visit.  Exam:  General: NAD, ill-appearing  Cardiovascular: S1, S2 present  Respiratory: CTAB  Abdomen: Soft, nontender, nondistended, bowel sounds present  Musculoskeletal: No bilateral pedal edema noted  Skin: Normal  Psychiatry: Normal mood  Neurology: No obvious focal neurologic deficits noted   Data Reviewed: CBC: Recent Labs  Lab 10/28/19 1600 10/29/19 0809 10/30/19 0529  WBC 21.3* 19.7* 11.3*  NEUTROABS 18.6*  --  8.7*  HGB 14.2 13.0 12.8*  HCT 44.1 40.0 38.9*  MCV 86.8 86.8 85.3  PLT 158 128* 607*   Basic Metabolic Panel: Recent Labs  Lab 10/28/19 1600 10/29/19 0809 10/30/19 0744  NA 134*  --  134*  K 4.1  --  4.2  CL 100  --  104  CO2 24  --  21*  GLUCOSE 242*  --  215*  BUN 7*  --  11  CREATININE 1.08 0.97 0.97  CALCIUM 8.3*  --  7.9*   GFR: Estimated Creatinine Clearance: 75.2 mL/min (by C-G formula based on SCr of 0.97 mg/dL). Liver Function Tests: Recent Labs  Lab 10/28/19 1600  AST 19  ALT 24  ALKPHOS 56  BILITOT 0.8  PROT 6.4*  ALBUMIN 3.2*   No results for input(s): LIPASE, AMYLASE in the last 168 hours. No results for input(s): AMMONIA in the last 168 hours. Coagulation Profile: No results for input(s): INR, PROTIME in the last 168 hours. Cardiac Enzymes: No results for input(s): CKTOTAL, CKMB, CKMBINDEX, TROPONINI in the last 168 hours. BNP (last 3 results) No results for input(s): PROBNP in the last 8760 hours. HbA1C: Recent Labs    10/29/19 0809  HGBA1C 8.3*   CBG: Recent Labs  Lab 10/29/19 1216 10/29/19 1627 10/29/19 2108 10/30/19 0627 10/30/19 1610  GLUCAP 168* 144* 185* 181* 158*   Lipid Profile: Recent Labs    10/29/19 0809  CHOL 114  HDL 46  LDLCALC 56  TRIG 59  CHOLHDL 2.5   Thyroid Function Tests: No results for input(s): TSH, T4TOTAL, FREET4, T3FREE, THYROIDAB in the last 72 hours. Anemia Panel: No results for input(s): VITAMINB12, FOLATE, FERRITIN, TIBC, IRON, RETICCTPCT in the last 72  hours. Urine analysis:    Component Value Date/Time   COLORURINE YELLOW 10/28/2019 1733   APPEARANCEUR HAZY (A) 10/28/2019 1733   LABSPEC 1.011 10/28/2019 1733   PHURINE 7.0 10/28/2019 1733   GLUCOSEU 150 (A) 10/28/2019 1733   HGBUR NEGATIVE 10/28/2019 1733   BILIRUBINUR NEGATIVE 10/28/2019 1733   KETONESUR NEGATIVE 10/28/2019 1733   PROTEINUR NEGATIVE 10/28/2019 1733   NITRITE NEGATIVE 10/28/2019 1733   LEUKOCYTESUR SMALL (A) 10/28/2019 1733   Sepsis Labs: _0 (procalcitonin:4,lacticidven:4)  ) Recent Results (from the past 240 hour(s))  Urine Culture     Status: Abnormal   Collection Time: 10/28/19  7:46 PM   Specimen: Urine, Clean Catch  Result Value Ref Range Status   Specimen Description URINE, CLEAN CATCH  Final   Special Requests   Final    NONE Performed at [facility], [place] [address], [place], [place] [address]    Culture >=100,000 COLONIES/mL KLEBSIELLA OXYTOCA (A)  Final   Report Status 10/30/2019 FINAL  Final   Organism ID, Bacteria KLEBSIELLA OXYTOCA (  A)  Final      Susceptibility   Klebsiella oxytoca - MIC*    AMPICILLIN >=32 RESISTANT Resistant     CEFAZOLIN 16 SENSITIVE Sensitive     CEFTRIAXONE <=0.25 SENSITIVE Sensitive     CIPROFLOXACIN <=0.25 SENSITIVE Sensitive     GENTAMICIN <=1 SENSITIVE Sensitive     IMIPENEM <=0.25 SENSITIVE Sensitive     NITROFURANTOIN <=16 SENSITIVE Sensitive     TRIMETH/SULFA <=20 SENSITIVE Sensitive     AMPICILLIN/SULBACTAM 8 SENSITIVE Sensitive     PIP/TAZO <=4 SENSITIVE Sensitive     * >=100,000 COLONIES/mL KLEBSIELLA OXYTOCA  Culture, blood (routine x 2)     Status: None (Preliminary result)   Collection Time: 10/29/19  8:10 AM   Specimen: BLOOD RIGHT HAND  Result Value Ref Range Status   Specimen Description BLOOD RIGHT HAND  Final   Special Requests   Final    BOTTLES DRAWN AEROBIC AND ANAEROBIC Blood Culture results may not be optimal due to an excessive volume of blood received in culture bottles    Culture   Final    NO GROWTH 1 DAY Performed at [facility], [place] [address] [place], [place] [address]    Report Status PENDING  Incomplete  Culture, blood (routine x 2)     Status: None (Preliminary result)   Collection Time: 10/29/19  8:20 AM   Specimen: BLOOD LEFT ARM  Result Value Ref Range Status   Specimen Description BLOOD LEFT ARM  Final   Special Requests   Final    BOTTLES DRAWN AEROBIC AND ANAEROBIC Blood Culture adequate volume   Culture   Final    NO GROWTH 1 DAY Performed at [facility], [place] [address], [place], [place] [address]    Report Status PENDING  Incomplete      Studies: No results found.  Scheduled Meds: . aspirin  325 mg Oral Daily  . atorvastatin  20 mg Oral q1800  . brimonidine  1 drop Both Eyes BID  . clopidogrel  75 mg Oral Daily  . enoxaparin (LOVENOX) injection  40 mg Subcutaneous Q24H  . insulin aspart  0-9 Units Subcutaneous TID WC    Continuous Infusions: . cefTRIAXone (ROCEPHIN)  IV 1 g (10/30/19 1721)     LOS: 1 day     Alma Friendly, MD [facility]  If 7PM-7AM, please contact night-coverage [email or web address] 10/30/2019, 5:25 PM

## 2019-10-30 NOTE — Evaluation (Signed)
Occupational Therapy Evaluation Patient Details Name: Aaron Mendez MRN: [id number] DOB: 10-16-1945 Today's Date: 10/30/2019    History of Present Illness Aaron Mendez is a 74 y.o. male with history of hyperlipidemia and glaucoma was recently admitted and discharged on February 8 after being treated for Covid infection lives at home alone and usually visited by patient's daughter.  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   Clinical Impression   Pt admitted with above. He demonstrates the below listed deficits and will benefit from continued OT to maximize safety and independence with BADLs.  Pt presents to OT with decreased activity tolerance, mild balance deficits, generalized weakness, as well as impaired cognition.   He currently requires min guard assist for ADLs, but requires an extensive amount of time to complete as he frequently self distracts.  He appears to be struggling emotionally with all that has occurred to him.  He reports he lives with a roommate and daughter checks on him daily.  Recommend that he have 24 hour supervision initially at discharge, that he have assist with medications, and meal prep, and recommend no driving at this time.  Will follow acutely.       Follow Up Recommendations  Home health OT;Supervision - Intermittent    Equipment Recommendations  3 in 1 bedside commode    Recommendations for Other Services       Precautions / Restrictions Precautions Precautions: Fall Restrictions Weight Bearing Restrictions: No      Mobility Bed Mobility Overal bed mobility: Needs Assistance Bed Mobility: Supine to Sit     Supine to sit: Min assist     General bed mobility comments: Slow to rise, min assist for hand to pull through to EOB, cues for technique.  Transfers Overall transfer level: Needs assistance Equipment used: Rolling walker (2  wheeled) Transfers: Sit to/from Stand Sit to Stand: Min guard         General transfer comment: Min guard for safety. Good strength and stability once upright with RW for support lightly as needed. Cues for technique.    Balance Overall balance assessment: Mild deficits observed, not formally tested                                         ADL either performed or assessed with clinical judgement   ADL Overall ADL's : Needs assistance/impaired Eating/Feeding: Modified independent   Grooming: Wash/dry face;Wash/dry hands;Oral care;Brushing hair;Min guard;Standing   Upper Body Bathing: Supervision/ safety;Set up;Sitting   Lower Body Bathing: Min guard;Sit to/from stand   Upper Body Dressing : Set up;Supervision/safety;Sitting   Lower Body Dressing: Min guard;Sit to/from stand   Toilet Transfer: Min guard;Ambulation;Comfort height toilet;Grab bars;RW   Toileting- Architect and Hygiene: Min guard;Sit to/from stand       Functional mobility during ADLs: Min guard;Rolling walker       Vision Baseline Vision/History: Cataracts;Glaucoma Patient Visual Report: No change from baseline Additional Comments: To be assessed      Perception Perception Perception Tested?: Yes   Praxis Praxis Praxis tested?: Within functional limits    Pertinent Vitals/Pain Pain Assessment: Faces Faces Pain Scale: Hurts little more Pain Location: back  Pain Descriptors / Indicators: Grimacing;Guarding Pain Intervention(s): Monitored during session     Hand Dominance Right   Extremity/Trunk Assessment Upper Extremity Assessment Upper  Extremity Assessment: RUE deficits/detail;LUE deficits/detail;Generalized weakness RUE Deficits / Details: bil UEs tremulous  RUE Coordination: decreased fine motor LUE Deficits / Details: bil UEs tremulous LUE Coordination: decreased fine motor   Lower Extremity Assessment Lower Extremity Assessment: Defer to PT evaluation    Cervical / Trunk Assessment Cervical / Trunk Assessment: Normal   Communication Communication Communication: No difficulties   Cognition Arousal/Alertness: Awake/alert Behavior During Therapy: WFL for tasks assessed/performed Overall Cognitive Status: Impaired/Different from baseline Area of Impairment: Orientation;Attention;Memory;Following commands;Safety/judgement;Awareness;Problem solving                 Orientation Level: Disoriented to;Time;Situation Current Attention Level: Sustained;Selective Memory: Decreased short-term memory Following Commands: Follows one step commands consistently;Follows multi-step commands inconsistently Safety/Judgement: Decreased awareness of deficits Awareness: Intellectual Problem Solving: Slow processing;Decreased initiation;Requires verbal cues;Requires tactile cues General Comments: Pt not oriented to day.  He thinks it's Wednesday and suprised it is Sunday.  He was unaware of why he is in hospital.  He states know one has told him anything, but when asked if he had a stroke, he is aware of this.  He follows one step commands consistently with cues.   He gets "lost" in his thoughts and reminiscing about his deceased family members thus requiring > 30 mins to don socks as he frequently self distracted.   He endorses an extreme sense of loss.  He requires min - mod cues for problem solving during ADLs    General Comments  Pt requires extensive amount of time to complete tasks     Exercises     Shoulder Instructions      Home Living Family/patient expects to be discharged to:: Private residence Living Arrangements: Non-relatives/Friends Available Help at Discharge: Family;Friend(s);Available PRN/intermittently Type of Home: House Home Access: Stairs to enter [employer] of Steps: 3 Entrance Stairs-Rails: Left Home Layout: One level     Bathroom Shower/Tub: Tub/shower unit         Home Equipment: Environmental consultant - 2  wheels;Bedside commode;Transport chair;Shower seat   Additional Comments: Aaron Mendez is a 74 y.o. male with history of hyperlipidemia and glaucoma was recently admitted and discharged on February 8 after being treated for Covid infection lives at home alone and usually visited by patient's daughter.  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  He reports he has not driven since that time         OT Problem List: Decreased strength;Decreased activity tolerance;Impaired balance (sitting and/or standing);Decreased coordination;Decreased cognition;Decreased safety awareness;Decreased knowledge of use of DME or AE      OT Treatment/Interventions: Self-care/ADL training;Therapeutic exercise;Neuromuscular education;DME and/or AE instruction;Therapeutic activities;Cognitive remediation/compensation;Patient/family education;Balance training    OT Goals(Current goals can be found in the care plan section) Acute Rehab OT Goals Patient Stated Goal: to go home today  OT Goal Formulation: With patient Time For Goal Achievement: 11/13/19 Potential to Achieve Goals: Good ADL Goals Pt Will Perform Grooming: with supervision;standing Pt Will Perform Upper Body Bathing: with set-up;sitting Pt Will Perform Lower Body Bathing:  with supervision;sit to/from stand Pt Will Perform Upper Body Dressing: with set-up;sitting Pt Will Perform Lower Body Dressing: with supervision;sit to/from stand Pt  Will Transfer to Toilet: with supervision;ambulating;regular height toilet;grab bars Pt Will Perform Toileting - Clothing Manipulation and hygiene: with supervision;sit to/from stand Additional ADL Goal #1: Pt will complete full ADL within 25 mins with no more than 2 rest breaks  OT Frequency: Min 2X/week   Barriers to D/C: Decreased caregiver support          Co-evaluation              AM-PAC OT "6 Clicks" Daily Activity     Outcome Measure Help from another person eating meals?: A Little Help from another person taking care of personal grooming?: A Little Help from another person toileting, which includes using toliet, bedpan, or urinal?: A Little Help from another person bathing (including washing, rinsing, drying)?: A Little Help from another person to put on and taking off regular upper body clothing?: A Little Help from another person to put on and taking off regular lower body clothing?: A Little 6 Click Score: 18   End of Session Equipment Utilized During Treatment: Gait belt;Rolling walker Nurse Communication: Mobility status  Activity Tolerance: Patient tolerated treatment well Patient left: in bed;with bed alarm set;with call bell/phone within reach;with nursing/sitter in room  OT Visit Diagnosis: Unsteadiness on feet (R26.81);Cognitive communication deficit (R41.841) Symptoms and signs involving cognitive functions: Cerebral infarction                Time: 2952-8413 OT Time Calculation (min): 59 min Charges:  OT General Charges $OT Visit: 1 Visit OT Evaluation $OT Eval Moderate Complexity: 1 Mod OT Treatments $Self Care/Home Management : 23-37 mins $Therapeutic Activity: 8-22 mins  Nilsa Nutting., OTR/L Acute Rehabilitation Services Pager [phone] Office [place], [place] 10/30/2019, 12:09 PM

## 2019-10-31 LAB — CBC WITH DIFFERENTIAL/PLATELET
Abs Immature Granulocytes: 0.06 10*3/uL (ref 0.00–0.07)
Basophils Absolute: 0 10*3/uL (ref 0.0–0.1)
Basophils Relative: 0 %
Eosinophils Absolute: 0.1 10*3/uL (ref 0.0–0.5)
Eosinophils Relative: 2 %
HCT: 36.2 % — ABNORMAL LOW (ref 39.0–52.0)
Hemoglobin: 11.7 g/dL — ABNORMAL LOW (ref 13.0–17.0)
Immature Granulocytes: 1 %
Lymphocytes Relative: 16 %
Lymphs Abs: 1.1 10*3/uL (ref 0.7–4.0)
MCH: 27.9 pg (ref 26.0–34.0)
MCHC: 32.3 g/dL (ref 30.0–36.0)
MCV: 86.4 fL (ref 80.0–100.0)
Monocytes Absolute: 1.2 10*3/uL — ABNORMAL HIGH (ref 0.1–1.0)
Monocytes Relative: 17 %
Neutro Abs: 4.4 10*3/uL (ref 1.7–7.7)
Neutrophils Relative %: 64 %
Platelets: 145 10*3/uL — ABNORMAL LOW (ref 150–400)
RBC: 4.19 MIL/uL — ABNORMAL LOW (ref 4.22–5.81)
RDW: 12.7 % (ref 11.5–15.5)
WBC: 6.9 10*3/uL (ref 4.0–10.5)
nRBC: 0 % (ref 0.0–0.2)

## 2019-10-31 LAB — BASIC METABOLIC PANEL
Anion gap: 11 (ref 5–15)
BUN: 9 mg/dL (ref 8–23)
CO2: 21 mmol/L — ABNORMAL LOW (ref 22–32)
Calcium: 8 mg/dL — ABNORMAL LOW (ref 8.9–10.3)
Chloride: 101 mmol/L (ref 98–111)
Creatinine, Ser: 1.04 mg/dL (ref 0.61–1.24)
GFR calc Af Amer: >60 mL/min (ref >60)
GFR calc non Af Amer: >60 mL/min (ref >60)
Glucose, Bld: 154 mg/dL — ABNORMAL HIGH (ref 70–99)
Potassium: 3.9 mmol/L (ref 3.5–5.1)
Sodium: 133 mmol/L — ABNORMAL LOW (ref 135–145)

## 2019-10-31 LAB — GLUCOSE, CAPILLARY
Glucose-Capillary: 255 mg/dL — ABNORMAL HIGH (ref 70–99)
Glucose-Capillary: 155 mg/dL — ABNORMAL HIGH (ref 70–99)
Glucose-Capillary: 248 mg/dL — ABNORMAL HIGH (ref 70–99)
Glucose-Capillary: 177 mg/dL — ABNORMAL HIGH (ref 70–99)

## 2019-10-31 MED ORDER — ASPIRIN 325 MG PO TABS: 325.0000 mg | ORAL_TABLET | Freq: Every day | ORAL | 0 refills | Status: DC

## 2019-10-31 MED ORDER — CEFDINIR 300 MG PO CAPS: 300.0000 mg | ORAL_CAPSULE | Freq: Two times a day (BID) | ORAL | 0 refills | Status: AC

## 2019-10-31 MED ORDER — ATORVASTATIN CALCIUM 20 MG PO TABS: 20.0000 mg | ORAL_TABLET | Freq: Every day | ORAL | 0 refills | Status: DC

## 2019-10-31 MED ORDER — CLOPIDOGREL BISULFATE 75 MG PO TABS: 75.0000 mg | ORAL_TABLET | Freq: Every day | ORAL | 0 refills | Status: DC

## 2019-10-31 MED ORDER — BRIMONIDINE TARTRATE 0.2 % OP SOLN: 1.0000 [drp] | Freq: Two times a day (BID) | OPHTHALMIC | 0 refills | Status: AC

## 2019-10-31 MED ORDER — METFORMIN HCL 500 MG PO TABS: 500.0000 mg | ORAL_TABLET | Freq: Two times a day (BID) | ORAL | 0 refills | Status: DC

## 2019-10-31 NOTE — Progress Notes (Signed)
Patient discharged with daughter to home. Discharge paperwork reviewed with daughter, [name]. Daughter verbalized understanding to pick up prescriptions from pharmacy. Patient discharged with all personal belongings. No additional concerns or questions asked at time of discharge. Patient departed unit via wheelchair escorted by staff.

## 2019-10-31 NOTE — Discharge Summary (Signed)
Discharge Summary  Aaron Mendez YIF:[id number] DOB: 1946/07/06  PCP: Aaron Nutting, DO  Admit date: 10/28/2019 Discharge date: 10/31/2019  Time spent: 40 mins  Recommendations for Outpatient Follow-up:  1. PCP within 1 week 2. Neurology in 4 weeks  Discharge Diagnoses:  Active Hospital Problems   Diagnosis Date Noted   Acute CVA (cerebrovascular accident) ([place]) 10/29/2019   Acute lower UTI 10/29/2019   Hyperglycemia 10/29/2019    Resolved Hospital Problems  No resolved problems to display.    Discharge Condition: Stable  Diet recommendation: As tolerated  Vitals:   10/31/19 0308 10/31/19 0800  BP: 112/74 (!) 104/58  Pulse: 68 60  Resp: 19 19  Temp: 98.3 F (36.8 C) 98.2 F (36.8 C)  SpO2: 96% 90%    History of present illness:  Aaron Mendez a 74 y.o.malewithhistory of hyperlipidemia and glaucoma was recently admitted and discharged on February 8, 2 weeks ago after being treated for Covid infection lives at home alone and usually visited by patient's daughter was found to be increasingly confused and worsening confusion over the last 1 week. PTA, patient's daughter went to check on the patient, met pt walking naked and and was not able to recognize her.No new changes in medications except for patient was prescribed Metformin and was on Decadron which is not sure if patient has taken it. In the ED, pt alert awake, oriented to his name moves all extremities and MRI brain shows left cerebellar stroke subacute. Neurologist Aaron Mendez has been consulted. In addition patient's labs also show WBC count of 21.3 patient is febrile 100.4 degrees temperature and UA is consistent with UTI. Chest x-ray does not show any infiltrates. EKG shows normal sinus rhythm. Patient admitted for CVA with fever likely from UTI and acute encephalopathy likely from both.    Today, patient denies any new complaints, was able to ambulate the hallway without any significant difficulties.   Patient noted to be oriented, alert, awake, denies any chest pain, shortness of breath, abdominal pain, nausea/vomiting, fever/chills.  Patient advised to go home with daughter for 24-hour supervision for the next couple of days until transition back to his own home with home health PT/OT/RN/aide/social worker.  Discussed extensively with daughter Aaron Mendez on 10/31/2019 who is a nursing tech at the cardiac cath lab.    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  No infiltrates or effusions S/P IV ceftriaxone, will switch to p.o. cefdinir to complete 7 days of treatment Follow-up with PCP  Acute metabolic encephalopathy Resolved Likely due to above Management as above  ??Type 2 diabetes mellitus May be worsened by recent steroid use A1c 5.8 on 10/16/2019, currently 8.3 on 10/29/2019 Recently on steroid for Covid infection Continue Metformin Follow-up with PCP with repeat A1c and further management  Hyperlipidemia Continue statins  Recent  COVID-19 infection Tested Covid positive on 10/11/2019 No respiratory symptoms, currently not requiring oxygen Follow-up with PCP         Malnutrition Type:       Malnutrition Characteristics:      Nutrition Interventions:      Estimated body mass index is 28.27 kg/m as calculated from the following:   Height as of 10/19/19: 5' 10"  (1.778 m).   Weight as of 10/19/19: 89.4 kg.    Procedures:  None  Consultations:  Neurology  Discharge Exam: BP (!) 104/58 (BP Location: Left Arm)    Pulse 60    Temp 98.2 F (36.8 C) (Oral)    Resp 19    SpO2 90%   General: NAD Cardiovascular: S1, S2 present Respiratory: CTA B Neurology: No obvious focal neurologic deficits noted    Discharge Instructions You were cared for by a hospitalist during your hospital stay. If you have any questions about your discharge medications or the care you received while you were in the hospital after you are discharged, you can call the unit and asked to speak with the hospitalist on call if the hospitalist that took care of you is not available. Once you are discharged, your primary care physician will handle any further medical issues. Please note that NO REFILLS for any discharge medications will be authorized once you are discharged, as it is imperative that you return to your primary care physician (or establish a relationship with a primary care physician if you do not have one) for your aftercare needs so that they can reassess your need for medications and monitor your lab values.  Discharge Instructions    Ambulatory referral to Neurology   Complete by: As directed    Follow up with stroke clinic NP (Aaron Mendez or Aaron Mendez, if both not available, consider Aaron Mendez, or Aaron Mendez) at [facility] in about 4 weeks. Thanks.   Diet - low sodium heart healthy   Complete by: As directed    Increase activity slowly   Complete by: As directed      Allergies as of 10/31/2019   No Known Allergies     Medication List    STOP taking these medications   dexamethasone 6 MG tablet Commonly known as: DECADRON   ibuprofen 800 MG tablet Commonly known  as: ADVIL   naproxen 500 MG tablet Commonly known as: NAPROSYN     TAKE these medications   aspirin 325 MG tablet Take 1 tablet (325 mg total) by mouth daily. Start taking on: November 01, 2019   atorvastatin 20 MG tablet Commonly known as: LIPITOR Take 1 tablet (20 mg total) by mouth daily. What changed: See the new instructions.   brimonidine 0.2 % ophthalmic solution Commonly known as: ALPHAGAN Place 1 drop into both eyes 2 (two) times daily.   cefdinir 300 MG capsule Commonly known as: OMNICEF Take 1 capsule (300 mg total) by mouth 2 (two) times daily for 5 days.   clopidogrel 75 MG tablet Commonly known as: PLAVIX Take 1 tablet (75 mg total) by mouth daily. Start taking on: November 01, 2019   metFORMIN 500 MG tablet Commonly known as: GLUCOPHAGE Take 1 tablet (500 mg total) by mouth 2 (two) times daily with a meal.   multivitamin with minerals Tabs tablet Take 1 tablet by mouth daily.      No Known Allergies Follow-up Information    Aaron Nutting, DO. Schedule an appointment as soon as possible for a visit in 2 days.  Specialty: Family Medicine Contact information: [address] [place] [address] [place], [facility].   Specialty: Home Health Services Contact information: [place] [address] [phone]        [facility]. Schedule an appointment as soon as possible for a visit in 4 week(s).   Specialty: Neurology Contact information: [address] [place] [phone]           The results of significant diagnostics from this hospitalization (including imaging, microbiology, ancillary and laboratory) are listed below for reference.    Significant Diagnostic Studies: CT ANGIO HEAD W OR WO CONTRAST  Addendum Date: 10/29/2019   ADDENDUM REPORT: 10/29/2019 12:25 ADDENDUM: Patchy opacity in  the apical lungs, please ensure patient is COVID-19 negative. Electronically Signed   By: Monte Fantasia M.D.   On: 10/29/2019 12:25   Result Date: 10/29/2019 CLINICAL DATA:  Stroke follow-up. EXAM: CT ANGIOGRAPHY HEAD AND NECK TECHNIQUE: Multidetector CT imaging of the head and neck was performed using the standard protocol during bolus administration of intravenous contrast. Multiplanar CT image reconstructions and MIPs were obtained to evaluate the vascular anatomy. Carotid stenosis measurements (when applicable) are obtained utilizing NASCET criteria, using the distal internal carotid diameter as the denominator. CONTRAST:  71m OMNIPAQUE IOHEXOL 350 MG/ML SOLN COMPARISON:  Brain MRI from earlier today FINDINGS: CTA NECK FINDINGS Aortic arch: Atheromatous arch with irregular plaque at the isthmus. Right carotid system: Mild calcified plaque at the bulb. No stenosis, ulceration, or beading. Left carotid system: Small web seen at the ICA bulb. No stenosis or beading. Vertebral arteries: 60% proximal left subclavian stenosis due to vessel kink and plaque. Soon after its origin the left vertebral artery is occluded with thready intermittent reconstitution and patency at the level of the dura. The right vertebral artery is smooth and widely patent. Skeleton: Cervical spine degeneration.  Negative Other neck: No acute finding. Upper chest: Mild patchy ground-glass opacity in the apical lungs. Review of the MIP images confirms the above findings CTA HEAD FINDINGS Anterior circulation: Atherosclerotic plaque along the carotid siphons which is moderate. Accounting for artifact from calcified plaque blooming no focal flow reducing stenosis is suspected. There is no branch occlusion, beading, or aneurysm. Scattered atheromatous changes of medium size branches, most notably right A3 and left M3/4. Posterior circulation: The left vertebral artery is patent at the dura but there is prominent narrowing on sagittal reformats  as it crosses through the dura, likely with low-density plaque along the upper wall. The left vertebral be comes attenuated at the PICA and thereafter is occluded until there is a wisp of reconstitution at the basilar. This is likely in acute occlusion given the findings in the neck and on MRI. No aneurysm, beading, or more distal embolus is seen. Distal PCA atheromatous changes. Venous sinuses: Negative in the arterial phase Anatomic variants: No significant finding Review of the MIP images confirms the above findings IMPRESSION: 1. 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 2. 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 3. Intracranial atherosclerosis mainly affecting medium size vessels and the siphons. Electronically Signed: By: JMonte FantasiaM.D. On: 10/29/2019 11:51   CT Head Wo Contrast  Result Date: 10/28/2019 CLINICAL DATA:  Recent onset memory difficulty. EXAM: CT HEAD WITHOUT CONTRAST TECHNIQUE: Contiguous axial images were  obtained from the base of the skull through the vertex without intravenous contrast. COMPARISON:  None. FINDINGS: Brain: No evidence of acute infarction, hemorrhage, hydrocephalus, extra-axial collection or mass lesion/mass effect. There is chronic microvascular ischemic change of hypoattenuation seen in the subcortical and periventricular deep white matter. Remote left cerebellar infarct is noted. Vascular: Extensive atherosclerosis. Skull: Intact.  No focal lesion. Sinuses/Orbits: Status post maxillary antrostomy. Otherwise negative. Other: None. IMPRESSION: No acute intracranial abnormality. 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 infarct. Electronically Signed   By: Inge Rise M.D.   On: 10/28/2019 19:14   CT ANGIO NECK W OR WO CONTRAST  Addendum Date: 10/29/2019   ADDENDUM REPORT: 10/29/2019 12:25 ADDENDUM: Patchy opacity in the apical lungs,  please ensure patient is COVID-19 negative. Electronically Signed   By: Monte Fantasia M.D.   On: 10/29/2019 12:25   Result Date: 10/29/2019 CLINICAL DATA:  Stroke follow-up. EXAM: CT ANGIOGRAPHY HEAD AND NECK TECHNIQUE: Multidetector CT imaging of the head and neck was performed using the standard protocol during bolus administration of intravenous contrast. Multiplanar CT image reconstructions and MIPs were obtained to evaluate the vascular anatomy. Carotid stenosis measurements (when applicable) are obtained utilizing NASCET criteria, using the distal internal carotid diameter as the denominator. CONTRAST:  42m OMNIPAQUE IOHEXOL 350 MG/ML SOLN COMPARISON:  Brain MRI from earlier today FINDINGS: CTA NECK FINDINGS Aortic arch: Atheromatous arch with irregular plaque at the isthmus. Right carotid system: Mild calcified plaque at the bulb. No stenosis, ulceration, or beading. Left carotid system: Small web seen at the ICA bulb. No stenosis or beading. Vertebral arteries: 60% proximal left subclavian stenosis due to vessel kink and plaque. Soon after its origin the left vertebral artery is occluded with thready intermittent reconstitution and patency at the level of the dura. The right vertebral artery is smooth and widely patent. Skeleton: Cervical spine degeneration.  Negative Other neck: No acute finding. Upper chest: Mild patchy ground-glass opacity in the apical lungs. Review of the MIP images confirms the above findings CTA HEAD FINDINGS Anterior circulation: Atherosclerotic plaque along the carotid siphons which is moderate. Accounting for artifact from calcified plaque blooming no focal flow reducing stenosis is suspected. There is no branch occlusion, beading, or aneurysm. Scattered atheromatous changes of medium size branches, most notably right A3 and left M3/4. Posterior circulation: The left vertebral artery is patent at the dura but there is prominent narrowing on sagittal reformats as it crosses  through the dura, likely with low-density plaque along the upper wall. The left vertebral be comes attenuated at the PICA and thereafter is occluded until there is a wisp of reconstitution at the basilar. This is likely in acute occlusion given the findings in the neck and on MRI. No aneurysm, beading, or more distal embolus is seen. Distal PCA atheromatous changes. Venous sinuses: Negative in the arterial phase Anatomic variants: No significant finding Review of the MIP images confirms the above findings IMPRESSION: 1. 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 2. 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 3. Intracranial atherosclerosis mainly affecting medium size vessels and the siphons. Electronically Signed: By: JMonte FantasiaM.D. On: 10/29/2019 11:51   MR BRAIN WO CONTRAST  Result Date: 10/29/2019 CLINICAL DATA:  Generalized weakness.  Coronavirus infection. EXAM: MRI HEAD WITHOUT CONTRAST TECHNIQUE: Multiplanar, multiecho pulse sequences of the brain and surrounding structures were obtained without intravenous contrast. COMPARISON:  Head CT 10/28/2019 FINDINGS: Brain: Acute/early  subacute infarction of the left peripheral midportion of the cerebellum. No other acute intracranial finding. 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 Cerebral hemispheres show chronic small-vessel ischemic changes of a moderate to marked degree throughout the white matter. Old lacunar infarction right thalamus. No mass, hemorrhage, hydrocephalus or extra-axial collection. Many of the sequences are degraded by motion. Vascular: Major vessels at the base of the brain show flow. Skull and upper cervical spine: Negative Sinuses/Orbits: Clear/normal Other: None IMPRESSION: Acute to early subacute infarction in the peripheral left midportion of the cerebellum. Mild swelling but no hemorrhage. Old small vessel infarctions of the  pons, right thalamus and extensively throughout the cerebral hemispheric white matter. Electronically Signed   By: Nelson Chimes M.D.   On: 10/29/2019 02:39   DG Chest Portable 1 View  Result Date: 10/28/2019 CLINICAL DATA:  Altered mental status. EXAM: PORTABLE CHEST 1 VIEW COMPARISON:  10/11/2019 FINDINGS: The cardiac silhouette, mediastinal and hilar contours are within normal limits and stable. Slightly low lung volumes with mild vascular crowding and streaky basilar atelectasis. No infiltrates, edema or effusions. The bony thorax is intact. IMPRESSION: Low lung volumes with vascular crowding and streaky basilar atelectasis. No infiltrates or effusions. Electronically Signed   By: Marijo Sanes M.D.   On: 10/28/2019 16:26   DG Chest Port 1 View  Result Date: 10/11/2019 CLINICAL DATA:  Loss of appetite for 1 week with fatigue. Diagnosed with COVID-19 infection yesterday. EXAM: PORTABLE CHEST 1 VIEW COMPARISON:  Radiographs 07/26/2015. FINDINGS: 1353 hours. The heart size and mediastinal contours are normal. The lungs are clear. There is no pleural effusion or pneumothorax. No acute osseous findings are identified. IMPRESSION: No active cardiopulmonary process. Electronically Signed   By: Richardean Sale M.D.   On: 10/11/2019 14:21   ECHOCARDIOGRAM COMPLETE  Result Date: 10/29/2019    ECHOCARDIOGRAM REPORT   Patient Name:   Bryn Meanor Date of Exam: 10/29/2019 Medical Rec #:  [id number]    Height:       70.0 in Accession #:    [id number]   Weight:       197.0 lb Date of Birth:  10-26-1945    BSA:          2.074 m Patient Age:    56 years     BP:           106/77 mmHg Patient Gender: M            HR:           67 bpm. Exam Location:  Inpatient Procedure: 2D Echo, Cardiac Doppler and Color Doppler Indications:    Stroke 434.91 / I163.9  History:        Patient has no prior history of Echocardiogram examinations.                 Risk Factors:Dyslipidemia. Released from [facility] Feb.                  8, 2021.  Sonographer:    Clayton Lefort RDCS (AE) Referring Phys: [place]  1. Left ventricular ejection fraction, by estimation, is 50 to 55%. The left ventricle has low normal function. The left ventricle demonstrates global hypokinesis. There is moderate left ventricular hypertrophy. Left ventricular diastolic parameters were normal.  2. Right ventricular systolic function is normal. The right ventricular size is normal. Tricuspid regurgitation signal is inadequate for assessing PA pressure.  3. Thickening of the anterior mitral leaflet without clear  prolapse. Trivial mitral valve regurgitation.  4. The aortic valve is tricuspid. Aortic valve regurgitation is not visualized.  5. The inferior vena cava is normal in size with greater than 50% respiratory variability, suggesting right atrial pressure of 3 mmHg.  6. No atrial level shunt detected by color flow Doppler. FINDINGS  Left Ventricle: Left ventricular ejection fraction, by estimation, is 50 to 55%. The left ventricle has low normal function. The left ventricle demonstrates global hypokinesis. The left ventricular internal cavity size was normal in size. There is moderate left ventricular hypertrophy. Left ventricular diastolic parameters were normal. Right Ventricle: The right ventricular size is normal. No increase in right ventricular wall thickness. Right ventricular systolic function is normal. Tricuspid regurgitation signal is inadequate for assessing PA pressure. Left Atrium: Left atrial size was normal in size. Right Atrium: Right atrial size was normal in size. Pericardium: There is no evidence of pericardial effusion. Mitral Valve: The mitral valve is grossly normal. There is mild thickening of the mitral valve leaflet(s). Trivial mitral valve regurgitation. MV peak gradient, 2.4 mmHg. The mean mitral valve gradient is 1.0 mmHg. Tricuspid Valve: The tricuspid valve is grossly normal. Tricuspid valve regurgitation is mild.  Aortic Valve: The aortic valve is tricuspid. Aortic valve regurgitation is not visualized. Aortic valve mean gradient measures 3.0 mmHg. Aortic valve peak gradient measures 5.7 mmHg. Aortic valve area, by VTI measures 2.62 cm. Pulmonic Valve: The pulmonic valve was not well visualized. Pulmonic valve regurgitation is not visualized. Aorta: The aortic root is normal in size and structure. Venous: The inferior vena cava is normal in size with greater than 50% respiratory variability, suggesting right atrial pressure of 3 mmHg. IAS/Shunts: No atrial level shunt detected by color flow Doppler.  LEFT VENTRICLE PLAX 2D LVIDd:         4.40 cm  Diastology LVIDs:         3.00 cm  LV e' lateral:   10.70 cm/s LV PW:         1.20 cm  LV E/e' lateral: 6.1 LV IVS:        1.40 cm  LV e' medial:    7.18 cm/s LVOT diam:     2.00 cm  LV E/e' medial:  9.1 LV SV:         64.09 ml LV SV Index:   30.90 LVOT Area:     3.14 cm  RIGHT VENTRICLE             IVC RV Basal diam:  3.20 cm     IVC diam: 0.90 cm RV S prime:     13.80 cm/s TAPSE (M-mode): 2.0 cm LEFT ATRIUM             Index       RIGHT ATRIUM           Index LA diam:        3.60 cm 1.74 cm/m  RA Area:     13.00 cm LA Vol (A2C):   33.5 ml 16.15 ml/m RA Volume:   30.00 ml  14.47 ml/m LA Vol (A4C):   46.7 ml 22.52 ml/m LA Biplane Vol: 43.2 ml 20.83 ml/m  AORTIC VALVE AV Area (Vmax):    2.53 cm AV Area (Vmean):   2.15 cm AV Area (VTI):     2.62 cm AV Vmax:           119.00 cm/s AV Vmean:          84.200 cm/s AV VTI:  0.245 m AV Peak Grad:      5.7 mmHg AV Mean Grad:      3.0 mmHg LVOT Vmax:         96.00 cm/s LVOT Vmean:        57.600 cm/s LVOT VTI:          0.204 m LVOT/AV VTI ratio: 0.83  AORTA Ao Root diam: 3.30 cm Ao Asc diam:  3.70 cm MITRAL VALVE MV Area (PHT): 2.50 cm    SHUNTS MV Peak grad:  2.4 mmHg    Systemic VTI:  0.20 m MV Mean grad:  1.0 mmHg    Systemic Diam: 2.00 cm MV Vmax:       0.78 m/s MV Vmean:      48.4 cm/s MV Decel Time: 303 msec MV E  velocity: 65.60 cm/s MV A velocity: 75.80 cm/s MV E/A ratio:  0.87 Rozann Lesches MD Electronically signed by Rozann Lesches MD Signature Date/Time: 10/29/2019/3:35:05 PM    Final    VAS [place] LOWER EXTREMITY VENOUS (DVT)  Result Date: 10/29/2019  Lower Venous DVTStudy Indications: Stroke.  Comparison Study: No prior study Performing Technologist: Maudry Mayhew MHA, RDMS, RVT, RDCS  Examination Guidelines: A complete evaluation includes B-mode imaging, spectral Doppler, color Doppler, and power Doppler as needed of all accessible portions of each vessel. Bilateral testing is considered an integral part of a complete examination. Limited examinations for reoccurring indications may be performed as noted. The reflux portion of the exam is performed with the patient in reverse Trendelenburg.  +---------+---------------+---------+-----------+----------+--------------+  RIGHT     Compressibility Phasicity Spontaneity Properties Thrombus Aging  +---------+---------------+---------+-----------+----------+--------------+  CFV       Full            Yes       Yes                                    +---------+---------------+---------+-----------+----------+--------------+  SFJ       Full                                                             +---------+---------------+---------+-----------+----------+--------------+  FV Prox   Full                                                             +---------+---------------+---------+-----------+----------+--------------+  FV Mid    Full                                                             +---------+---------------+---------+-----------+----------+--------------+  FV Distal Full                                                             +---------+---------------+---------+-----------+----------+--------------+  PFV       Full                                                             +---------+---------------+---------+-----------+----------+--------------+   POP       Full            Yes       Yes                                    +---------+---------------+---------+-----------+----------+--------------+  PTV       Full                                                             +---------+---------------+---------+-----------+----------+--------------+  PERO      Full                                                             +---------+---------------+---------+-----------+----------+--------------+   +---------+---------------+---------+-----------+----------+--------------+  LEFT      Compressibility Phasicity Spontaneity Properties Thrombus Aging  +---------+---------------+---------+-----------+----------+--------------+  CFV       Full            Yes       Yes                                    +---------+---------------+---------+-----------+----------+--------------+  SFJ       Full                                                             +---------+---------------+---------+-----------+----------+--------------+  FV Prox   Full                                                             +---------+---------------+---------+-----------+----------+--------------+  FV Mid    Full                                                             +---------+---------------+---------+-----------+----------+--------------+  FV Distal Full                                                             +---------+---------------+---------+-----------+----------+--------------+  PFV       Full                                                             +---------+---------------+---------+-----------+----------+--------------+  POP       Full            Yes       Yes                                    +---------+---------------+---------+-----------+----------+--------------+  PTV       Full                                                             +---------+---------------+---------+-----------+----------+--------------+  PERO      Full                                                              +---------+---------------+---------+-----------+----------+--------------+     Summary: RIGHT: - There is no evidence of deep vein thrombosis in the lower extremity.  - No cystic structure found in the popliteal fossa.  LEFT: - There is no evidence of deep vein thrombosis in the lower extremity.  - No cystic structure found in the popliteal fossa.  *See table(s) above for measurements and observations. Electronically signed by Deitra Mayo MD on 10/29/2019 at 4:55:18 PM.    Final     Microbiology: Recent Results (from the past 240 hour(s))  Urine Culture     Status: Abnormal   Collection Time: 10/28/19  7:46 PM   Specimen: Urine, Clean Catch  Result Value Ref Range Status   Specimen Description URINE, CLEAN CATCH  Final   Special Requests   Final    NONE Performed at [facility], [address] [place], [place] [address]    Culture >=100,000 COLONIES/mL KLEBSIELLA OXYTOCA (A)  Final   Report Status 10/30/2019 FINAL  Final   Organism ID, Bacteria KLEBSIELLA OXYTOCA (A)  Final      Susceptibility   Klebsiella oxytoca - MIC*    AMPICILLIN >=32 RESISTANT Resistant     CEFAZOLIN 16 SENSITIVE Sensitive     CEFTRIAXONE <=0.25 SENSITIVE Sensitive     CIPROFLOXACIN <=0.25 SENSITIVE Sensitive     GENTAMICIN <=1 SENSITIVE Sensitive     IMIPENEM <=0.25 SENSITIVE Sensitive     NITROFURANTOIN <=16 SENSITIVE Sensitive     TRIMETH/SULFA <=20 SENSITIVE Sensitive     AMPICILLIN/SULBACTAM 8 SENSITIVE Sensitive     PIP/TAZO <=4 SENSITIVE Sensitive     * >=100,000 COLONIES/mL KLEBSIELLA OXYTOCA  Culture, blood (routine x 2)     Status: None (Preliminary result)   Collection Time: 10/29/19  8:10 AM   Specimen: BLOOD RIGHT HAND  Result Value Ref Range Status   Specimen Description BLOOD RIGHT HAND  Final   Special Requests  Final    BOTTLES DRAWN AEROBIC AND ANAEROBIC Blood Culture results may not be optimal due to an excessive volume of blood received in  culture bottles   Culture   Final    NO GROWTH 2 DAYS Performed at [facility] [address] [place], [place] [address]    Report Status PENDING  Incomplete  Culture, blood (routine x 2)     Status: None (Preliminary result)   Collection Time: 10/29/19  8:20 AM   Specimen: BLOOD LEFT ARM  Result Value Ref Range Status   Specimen Description BLOOD LEFT ARM  Final   Special Requests   Final    BOTTLES DRAWN AEROBIC AND ANAEROBIC Blood Culture adequate volume   Culture   Final    NO GROWTH 2 DAYS Performed at [facility], [place] [address] [place], [place] [address]    Report Status PENDING  Incomplete     Labs: Basic Metabolic Panel: Recent Labs  Lab 10/28/19 1600 10/29/19 0809 10/30/19 0744 10/31/19 0302  NA 134*  --  134* 133*  K 4.1  --  4.2 3.9  CL 100  --  104 101  CO2 24  --  21* 21*  GLUCOSE 242*  --  215* 154*  BUN 7*  --  11 9  CREATININE 1.08 0.97 0.97 1.04  CALCIUM 8.3*  --  7.9* 8.0*   Liver Function Tests: Recent Labs  Lab 10/28/19 1600  AST 19  ALT 24  ALKPHOS 56  BILITOT 0.8  PROT 6.4*  ALBUMIN 3.2*   No results for input(s): LIPASE, AMYLASE in the last 168 hours. No results for input(s): AMMONIA in the last 168 hours. CBC: Recent Labs  Lab 10/28/19 1600 10/29/19 0809 10/30/19 0529 10/31/19 0302  WBC 21.3* 19.7* 11.3* 6.9  NEUTROABS 18.6*  --  8.7* 4.4  HGB 14.2 13.0 12.8* 11.7*  HCT 44.1 40.0 38.9* 36.2*  MCV 86.8 86.8 85.3 86.4  PLT 158 128* 108* 145*   Cardiac Enzymes: No results for input(s): CKTOTAL, CKMB, CKMBINDEX, TROPONINI in the last 168 hours. BNP: BNP (last 3 results) No results for input(s): BNP in the last 8760 hours.  ProBNP (last 3 results) No results for input(s): PROBNP in the last 8760 hours.  CBG: Recent Labs  Lab 10/30/19 0627 10/30/19 1148 10/30/19 1610 10/30/19 2135 10/31/19 0606  GLUCAP 181* 255* 158* 141* 155*       Signed:  Alma Friendly, MD [facility] 10/31/2019, 11:28 AM

## 2019-10-31 NOTE — Progress Notes (Signed)
Physical Therapy Treatment Patient Details Name: Aaron Mendez MRN: [id number] DOB: 1945/10/29 Today's Date: 10/31/2019    History of Present Illness Aaron Mendez is a 74 y.o. male with history of hyperlipidemia and glaucoma was recently admitted and discharged on February 8 after being treated for Covid infection lives at home alone and usually visited by patient's daughter.  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    PT Comments    Pt progressing towards physical therapy goals. Overall, cognition appears improved this session however pt continues to demonstrate slow processing and decreased safety awareness/awareness of deficits. From a mobility standpoint, feel pt would be safe to d/c home as he is not technically requiring physical assistance for basic transfers and ambulation with the RW. From a cognitive standpoint, feel pt will need some increased assistance at least initially. It appears that pt is agreeable to d/c to daughter's home however unsure if he would have long stretches during the day where he would be alone. At this time, PT recommendations remain unchanged, however will defer to OT if they find further cognitive deficits during their session and recommend a change in discharge disposition.    Follow Up Recommendations  Home health PT;Supervision for mobility/OOB   Equipment Recommendations  None recommended by PT    Recommendations for Other Services       Precautions / Restrictions Precautions Precautions: Fall Restrictions Weight Bearing Restrictions: No    Mobility  Bed Mobility Overal bed mobility: Needs Assistance Bed Mobility: Supine to Sit     Supine to sit: Supervision     General bed mobility comments: HOB flat and rails lowered to simulate home environment. No assist provided but increased time required.   Transfers Overall transfer level: Needs  assistance Equipment used: Rolling walker (2 wheeled) Transfers: Sit to/from Stand Sit to Stand: Min guard;Supervision         General transfer comment: Close guard to supervision for safety. VC's for hand placement on seated surface for safety. Utilizing momentum initially however by end of session was able to power-up to full stand without momentum and without therapist hands-on guarding.   Ambulation/Gait Ambulation/Gait assistance: Supervision Gait Distance (Feet): 300 Feet Assistive device: Rolling walker (2 wheeled) Gait Pattern/deviations: Step-through pattern;Decreased stride length Gait velocity: decreased Gait velocity interpretation: <1.8 ft/sec, indicate of risk for recurrent falls General Gait Details: Slow but generally steady without assistance. Close supervision for safety. Pt required cues to keep walker close during turns to sit prepare for stand>sit.    Stairs Pt was able to complete 5 stairs with 1 railing on the L to simulate home environment. Close guard for safety provided however no assist required.              Wheelchair Mobility    Modified Rankin (Stroke Patients Only) Modified Rankin (Stroke Patients Only) Pre-Morbid Rankin Score: No significant disability Modified Rankin: Moderate disability     Balance Overall balance assessment: Mild deficits observed, not formally tested                                          Cognition Arousal/Alertness: Awake/alert Behavior During Therapy: WFL for tasks assessed/performed Overall Cognitive Status: Impaired/Different from baseline Area of Impairment: Attention;Memory;Following commands;Safety/judgement;Awareness;Problem solving  Orientation Level: Disoriented to;Situation Current Attention Level: Selective Memory: Decreased short-term memory Following Commands: Follows one step commands consistently;Follows multi-step commands  inconsistently Safety/Judgement: Decreased awareness of deficits Awareness: Emergent Problem Solving: Slow processing;Decreased initiation;Requires verbal cues General Comments: Orientation improved however pt thinks he is being discharged "any minute now" despite being told several times that he is not going home just yet. Continues to require cues and increased time for problem solving.      Exercises      General Comments        Pertinent Vitals/Pain Pain Assessment: Faces Faces Pain Scale: Hurts little more Pain Location: back  Pain Descriptors / Indicators: Grimacing;Guarding Pain Intervention(s): Limited activity within patient's tolerance;Monitored during session;Repositioned    Home Living                      Prior Function            PT Goals (current goals can now be found in the care plan section) Acute Rehab PT Goals Patient Stated Goal: to go home today  PT Goal Formulation: With patient Time For Goal Achievement: 11/12/19 Potential to Achieve Goals: Good Progress towards PT goals: Progressing toward goals    Frequency    Min 4X/week      PT Plan Current plan remains appropriate    Co-evaluation              AM-PAC PT "6 Clicks" Mobility   Outcome Measure  Help needed turning from your back to your side while in a flat bed without using bedrails?: None Help needed moving from lying on your back to sitting on the side of a flat bed without using bedrails?: A Little Help needed moving to and from a bed to a chair (including a wheelchair)?: A Little Help needed standing up from a chair using your arms (e.g., wheelchair or bedside chair)?: A Little Help needed to walk in hospital room?: A Little Help needed climbing 3-5 steps with a railing? : A Little 6 Click Score: 19    End of Session Equipment Utilized During Treatment: Gait belt Activity Tolerance: Patient tolerated treatment well Patient left: in chair;with call bell/phone  within reach;with chair alarm set Nurse Communication: Mobility status PT Visit Diagnosis: Muscle weakness (generalized) (M62.81);Other symptoms and signs involving the nervous system (R29.898);Other abnormalities of gait and mobility (R26.89)     Time: 8299-3716 PT Time Calculation (min) (ACUTE ONLY): 36 min  Charges:  $Gait Training: 23-37 mins                     Conni Slipper, PT, DPT Acute Rehabilitation Services Pager: [phone] Office: [phone]    Marylynn Pearson 10/31/2019, 1:06 PM
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
## 2019-10-31 NOTE — Progress Notes (Signed)
The chaplain visited with the patient to complete an advanced directive. The paperwork was signed and notarized. The chaplain also provided an empathetic ear as the patient spoke about losing everything and feeling tired. The chaplain will follow-up if the patient is not discharged.  Lavone Neri Chaplain Resident For questions concerning this note please contact me by pager [phone]

## 2019-11-01 ENCOUNTER — Telehealth: Payer: Self-pay | Admitting: *Deleted

## 2019-11-01 NOTE — Telephone Encounter (Signed)
Unable to reach pt first attempt. 

## 2019-11-02 ENCOUNTER — Telehealth: Payer: Self-pay

## 2019-11-02 NOTE — Telephone Encounter (Signed)
I have made two attempts and have been unable to reach patient. I have mailed the patient a letter requesting they call the office to schedule a hospital follow up appointment.   

## 2019-11-02 NOTE — Telephone Encounter (Signed)
I believe patient will be seeing Dr. Doreene Burke as he wanted to remain at [facility] and their nurse has been trying to reach out for TCM appt.   Will forward to their office as well.

## 2019-11-02 NOTE — Telephone Encounter (Signed)
Aaron Mendez from [facility] called stating that Aaron Mendez has had several stays at the hospital. As per skilled nurse, the office has been trying to contact provider since 10/17/19 with updates. However they were calling the wrong medical office. Aaron Mendez was admitted on 10/11/19 due to complications of Covid. Aaron Mendez was than readmitted on 2/19 for hyperglycemia and CVA. Aaron Mendez is currently stable and at home. The hospital has provided orders for physical, occupational therapy, home nurse visits, and social work. Aaron Mendez stated that Aaron Mendez is still under provider's care, which is why the office was contacted. Aaron Mendez will continue to contact the office with new updates as needed.

## 2019-11-02 NOTE — Telephone Encounter (Signed)
Please see message and advise.  Thank you. ° °

## 2019-11-02 NOTE — Telephone Encounter (Signed)
Dr. Doreene Burke patient would like TOC from Dr. Ashley Royalty okay to see patient? Also please read messages below and advise.

## 2019-11-03 LAB — CULTURE, BLOOD (ROUTINE X 2)
Culture: NO GROWTH
Culture: NO GROWTH
Special Requests: ADEQUATE

## 2019-11-03 NOTE — Telephone Encounter (Signed)
Yes that would be okay

## 2019-11-08 ENCOUNTER — Telehealth: Payer: Self-pay

## 2019-11-08 NOTE — Telephone Encounter (Signed)
Aaron Mendez with [facility] wants verbal orders for PT, OT, skilled nursing and social worker. Post hospital- Covid/stroke. Please advise.

## 2019-11-08 NOTE — Telephone Encounter (Signed)
Ok to provide VO for [facility] services.

## 2019-11-08 NOTE — Telephone Encounter (Signed)
I talked to pt and he said he wanted to follow Dr Ashley Royalty to his new location

## 2019-11-09 ENCOUNTER — Other Ambulatory Visit: Payer: Self-pay | Admitting: Family Medicine

## 2019-11-09 ENCOUNTER — Telehealth: Payer: Self-pay | Admitting: Family Medicine

## 2019-11-09 DIAGNOSIS — E119 Type 2 diabetes mellitus without complications: Secondary | ICD-10-CM | POA: Insufficient documentation

## 2019-11-09 MED ORDER — ACCU-CHEK AVIVA PLUS VI STRP: ORAL_STRIP | 12 refills | Status: AC

## 2019-11-09 MED ORDER — ACCU-CHEK SOFTCLIX LANCETS MISC: 12 refills | Status: AC

## 2019-11-09 MED ORDER — ACCU-CHEK AVIVA PLUS W/DEVICE KIT: PACK | 0 refills | Status: AC

## 2019-11-09 NOTE — Telephone Encounter (Signed)
[facility] advised. 

## 2019-11-09 NOTE — Telephone Encounter (Signed)
I have sent rx for new meter, strips and lancets to pharmacy.

## 2019-11-09 NOTE — Telephone Encounter (Signed)
Patient daughter called and he was recently placed on Metformin. She is wanting to know if we need to send in a new glucose meter for him to check his sugar before the appointment tomorrow. Please advise.

## 2019-11-10 ENCOUNTER — Telehealth (INDEPENDENT_AMBULATORY_CARE_PROVIDER_SITE_OTHER): Payer: Medicare PPO | Admitting: Family Medicine

## 2019-11-10 ENCOUNTER — Encounter: Payer: Self-pay | Admitting: Family Medicine

## 2019-11-10 DIAGNOSIS — U071 COVID-19: Secondary | ICD-10-CM

## 2019-11-10 DIAGNOSIS — E119 Type 2 diabetes mellitus without complications: Secondary | ICD-10-CM | POA: Diagnosis not present

## 2019-11-10 DIAGNOSIS — I63442 Cerebral infarction due to embolism of left cerebellar artery: Secondary | ICD-10-CM | POA: Diagnosis not present

## 2019-11-10 DIAGNOSIS — R509 Fever, unspecified: Secondary | ICD-10-CM

## 2019-11-10 NOTE — Progress Notes (Signed)
Called the patient and left a message to call back for appointment time. Unable to reach patient and left a message.    Called the patient and he is having some pain around his waist line. He reports that it started yesterday.  He reports the pain comes and goes.   He had checked the blood sugar and it gave him an error.   He wants to take some OTC vitamins for him to take.

## 2019-11-10 NOTE — Telephone Encounter (Signed)
Called patient and notified med sent. KG LPN

## 2019-11-13 ENCOUNTER — Encounter: Payer: Self-pay | Admitting: Family Medicine

## 2019-11-13 DIAGNOSIS — I63442 Cerebral infarction due to embolism of left cerebellar artery: Secondary | ICD-10-CM | POA: Insufficient documentation

## 2019-11-13 NOTE — Assessment & Plan Note (Signed)
Symptoms related to COVID-19 have resolved.

## 2019-11-13 NOTE — Progress Notes (Signed)
Aaron Mendez - 74 y.o. male MRN [id number]  Date of birth: June 27, 1946   This visit type was conducted due to national recommendations for restrictions regarding the COVID-19 Pandemic (e.g. social distancing).  This format is felt to be most appropriate for this patient at this time.  All issues noted in this document were discussed and addressed.  No physical exam was performed (except for noted visual exam findings with Video Visits).  I discussed the limitations of evaluation and management by telemedicine and the availability of in person appointments. The patient expressed understanding and agreed to proceed.  I connected with@ on 11/10/19 at 10:50 AM EST by a video enabled telemedicine application and verified that I am speaking with the correct person using two identifiers.  Interactive audio and video telecommunications were attempted between this provider and patient, however failed, due to patient having technical difficulties OR patient did not have access to video capability.  We continued and completed visit with audio only.    Present at visit: Luetta Nutting, DO Vivi Ferns   Patient Location: Home [place]   Provider location:   [facility]  Chief Complaint  Patient presents with  . Follow-up    HPI  Aaron Mendez is a 74 y.o. male with history of T2DM who presents via audio/video conferencing for a telehealth visit today.  He is following up today from recent hospitalization.  He was initially hospitalized from 10/11/19-10/17/19 due to diagnosis of COVID-19.  He had fever, chills, cough and weakness upon initial presentation.   He was treated with fluids, dexamethasone and remdesivir.  He was able to we weaned from O2 prior to discharge and ambulating normally prior to discharge.  He had been doing well until  10/28/19 when we returned to ED for complaint of weakness and AMS.  MRI completed in ED showing left subacute cerebellar stroke.  He had PT/OT/speech  therapy eval in hospital.  Plavix and lipitor added.  He is tolerating these well.  He was discharged on 10/31/19 and with [facility] PT/OT/RN services.  He reports that they have been coming out to work with him some but he doesn't feel that he really needs anything at this time.  He denies difficulty ambulating at this time.   He does have follow up with neuro in a few weeks.     ROS:  A comprehensive ROS was completed and negative except as noted per HPI  Past Medical History:  Diagnosis Date  . Bilateral primary osteoarthritis of knee   . Sickle cell trait ([place])     No past surgical history on file.  Family History  Problem Relation Age of Onset  . Sickle cell anemia Son     Social History   Socioeconomic History  . Marital status: Single    Spouse name: Not on file  . Number of children: Not on file  . Years of education: Not on file  . Highest education level: Not on file  Occupational History  . Not on file  Tobacco Use  . Smoking status: Never Smoker  . Smokeless tobacco: Never Used  Substance and Sexual Activity  . Alcohol use: No  . Drug use: No  . Sexual activity: Yes  Other Topics Concern  . Not on file  Social History Narrative  . Not on file   Social Determinants of Health   Financial Resource Strain:   . Difficulty of Paying Living Expenses: Not on file  Food Insecurity:   .  Worried About Charity fundraiser in the Last Year: Not on file  . Ran Out of Food in the Last Year: Not on file  Transportation Needs:   . Lack of Transportation (Medical): Not on file  . Lack of Transportation (Non-Medical): Not on file  Physical Activity:   . Days of Exercise per Week: Not on file  . Minutes of Exercise per Session: Not on file  Stress:   . Feeling of Stress : Not on file  Social Connections:   . Frequency of Communication with Friends and Family: Not on file  . Frequency of Social Gatherings with Friends and Family: Not on file  . Attends Religious Services: Not  on file  . Active Member of Clubs or Organizations: Not on file  . Attends Archivist Meetings: Not on file  . Marital Status: Not on file  Intimate Partner Violence:   . Fear of Current or Ex-Partner: Not on file  . Emotionally Abused: Not on file  . Physically Abused: Not on file  . Sexually Abused: Not on file     Current Outpatient Medications:  .  Accu-Chek Softclix Lancets lancets, Use to check glucose up to two times per day.  Dx: E11.9, Disp: 100 each, Rfl: 12 .  aspirin 325 MG tablet, Take 1 tablet (325 mg total) by mouth daily., Disp: 30 tablet, Rfl: 0 .  atorvastatin (LIPITOR) 20 MG tablet, Take 1 tablet (20 mg total) by mouth daily., Disp: 30 tablet, Rfl: 0 .  Blood Glucose Monitoring Suppl (ACCU-CHEK AVIVA PLUS) w/Device KIT, Use to check glucose up to two times per day.  Dx: E11.9, Disp: 1 kit, Rfl: 0 .  brimonidine (ALPHAGAN) 0.2 % ophthalmic solution, Place 1 drop into both eyes 2 (two) times daily., Disp: 3 mL, Rfl: 0 .  clopidogrel (PLAVIX) 75 MG tablet, Take 1 tablet (75 mg total) by mouth daily., Disp: 30 tablet, Rfl: 0 .  glucose blood (ACCU-CHEK AVIVA PLUS) test strip, Use to check glucose up to two times per day.  Dx: E11.9, Disp: 100 each, Rfl: 12 .  metFORMIN (GLUCOPHAGE) 500 MG tablet, Take 1 tablet (500 mg total) by mouth 2 (two) times daily with a meal., Disp: 60 tablet, Rfl: 0 .  Multiple Vitamin (MULTIVITAMIN WITH MINERALS) TABS tablet, Take 1 tablet by mouth daily., Disp: , Rfl:   EXAM:  VITALS per patient if applicable: BP 160/73 Comment: Patient was unable to get BP  Temp (!) 97.5 F (36.4 C) (Temporal)   Ht 5' 10" (1.778 m)   Wt 180 lb (81.6 kg)   BMI 25.83 kg/m   GENERAL: alert, oriented,  in no acute distress  PSYCH/NEURO: pleasant and cooperative, no obvious depression or anxiety, speech and thought processing grossly intact  ASSESSMENT AND PLAN:  Discussed the following assessment and plan:  Type 2 diabetes mellitus without  complications (HCC) Lab Results  Component Value Date   HGBA1C 8.3 (H) 10/29/2019  Continue metformin at current dose.   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   Continue plavix and asa.  Continue atorvastatin.   Fever due to COVID-19 Symptoms related to COVID-19 have resolved.   25 minutes spent including pre visit preparation, review of prior notes and labs, encounter with patient via video visit and same day documentation.    I discussed the assessment and treatment plan with the patient. The patient was provided an opportunity to ask questions and all were  answered. The patient agreed with the plan and demonstrated an understanding of the instructions.   The patient was advised to call back or seek an in-person evaluation if the symptoms worsen or if the condition fails to improve as anticipated.    Luetta Nutting, DO

## 2019-11-13 NOTE — Assessment & Plan Note (Signed)
Lab Results  Component Value Date   HGBA1C 8.3 (H) 10/29/2019  Continue metformin at current dose.

## 2019-11-13 NOTE — Assessment & Plan Note (Signed)
No significant residual deficits at this time.   Continue plavix and asa.  Continue atorvastatin.

## 2019-11-15 ENCOUNTER — Other Ambulatory Visit: Payer: Self-pay

## 2019-11-15 NOTE — Patient Outreach (Signed)
[facility]) Care Management  11/15/2019  Aaron Mendez [birth year] [id number]   Referral Date: 11/15/19 Referral Source: Self referral Referral Reason: Patient not sure how to use new glucometer.  Per patient someone is to come tomorrow but does not know who.      Telephone call to [facility]. Spoke with [employer]. She states nurse is scheduled to come out on Thursday to see patient.   Outreach Attempt:  Telephone call to patient. He states he is doing well.  Discussed reason for call. He states he was not sure who was coming out to see him.  Advised patient that the [facility] nurse is scheduled to come on Thursday to see him. He verbalized understanding and thankful for the information.  Patient given contact information for [place] home [facility].     Plan: RN CM will check back with patient again within 7 business days and patient agreeable.     Aaron Leriche, RN, MSN [facility] Care Management Care Management Coordinator Direct Line [phone] Toll Free: [phone]  Fax: [phone]

## 2019-11-17 ENCOUNTER — Telehealth: Payer: Self-pay

## 2019-11-17 NOTE — Telephone Encounter (Signed)
Heather with [facility] called stating patient has requested just 1 more home visit. He has received his new glucometer and it is working well. Patient competent with usage of machine. Per Heather, blood sugar today was 132.  OK give for 1 additional visit   Heather CB # [phone]  FYI to PCP

## 2019-11-17 NOTE — Telephone Encounter (Signed)
Ok to provide VO for 1 additional visit.

## 2019-11-18 NOTE — Telephone Encounter (Signed)
Aaron Mendez was notified per PCP recommendation and did not have any questions.

## 2019-11-21 ENCOUNTER — Other Ambulatory Visit: Payer: Self-pay

## 2019-11-21 NOTE — Patient Outreach (Signed)
[facility]) Care Management  [place]  11/21/2019   Aaron Mendez [birth year] [id number]  Subjective: Telephone call to patient for follow up.  Patient states he is doing good now.  He states he knows how to use his new glucometer.  He states he wants to get control of his sugars.  He states he is watching his diet, checking his sugars, and exercising now.  Discussed with patient importance of controlling his sugars and taking his medications. He states his friend puts medication in his pill box for him and he takes them. He states he lives in a rooming house and that his daughter is active with his care when he needs her. Patient states he has a car for transportation but does not drive but his friend takes him around where he needs to go.  He states that he has to reschedule his eye doctor appointment as he was sick when his appointment came earlier this year.    Discussed [facility] services and support with patient. He is agreeable to further nurse follow up calls to help him with his diabetes. CM contact information given.    Objective:   Encounter Medications:  Outpatient Encounter Medications as of 11/21/2019  Medication Sig  . Accu-Chek Softclix Lancets lancets Use to check glucose up to two times per day.  Dx: E11.9  . aspirin 325 MG tablet Take 1 tablet (325 mg total) by mouth daily.  Marland Kitchen atorvastatin (LIPITOR) 20 MG tablet Take 1 tablet (20 mg total) by mouth daily.  . Blood Glucose Monitoring Suppl (ACCU-CHEK AVIVA PLUS) w/Device KIT Use to check glucose up to two times per day.  Dx: E11.9  . brimonidine (ALPHAGAN) 0.2 % ophthalmic solution Place 1 drop into both eyes 2 (two) times daily.  . clopidogrel (PLAVIX) 75 MG tablet Take 1 tablet (75 mg total) by mouth daily.  Marland Kitchen glucose blood (ACCU-CHEK AVIVA PLUS) test strip Use to check glucose up to two times per day.  Dx: E11.9  . metFORMIN (GLUCOPHAGE) 500 MG tablet Take 1 tablet (500 mg total) by mouth 2 (two) times  daily with a meal.  . Multiple Vitamin (MULTIVITAMIN WITH MINERALS) TABS tablet Take 1 tablet by mouth daily.   No facility-administered encounter medications on file as of 11/21/2019.    Functional Status:  In your present state of health, do you have any difficulty performing the following activities: 11/21/2019 10/12/2019  Hearing? N N  Vision? N N  Difficulty concentrating or making decisions? N N  Walking or climbing stairs? N N  Dressing or bathing? N N  Doing errands, shopping? N N  Preparing Food and eating ? N -  Using the Toilet? N -  In the past six months, have you accidently leaked urine? N -  Do you have problems with loss of bowel control? N -  Managing your Medications? N -  Managing your Finances? N -  Housekeeping or managing your Housekeeping? N -  Some recent data might be hidden    Fall/Depression Screening: Fall Risk  11/21/2019 10/19/2019 05/06/2019  Falls in the past year? 0 0 0   PHQ 2/9 Scores 11/21/2019 10/19/2019 05/06/2019 12/21/2017  PHQ - 2 Score 0 0 0 0    Assessment: Patient with recent hospitalizations for COVID-19, stroke, and hyperglycemia. Patient adjusting to diabetes diagnosis and eager to keep it under control.   Plan:  [facility] CM Care Plan Problem One     Most Recent Value  Care Plan  Problem One  Recent diagnosis of diabetes/recent hospitalization stroke & COVID 19  Role Documenting the Problem One  Care Management Telephonic Coordinator  Care Plan for Problem One  Active  [facility] Long Term Goal   Patient will not admit to hospital within the next 90 days.  [facility] Long Term Goal Start Date  11/21/19  Interventions for Problem One Long Term Goal  Discussed with patient signs of  stroke, COVID safety, and blood sugar management.  [facility] CM Short Term Goal #1   Patient will continue to check blood sugars at least daily within the next 30 days.  [facility] CM Short Term Goal #1 Start Date  11/21/19  Interventions for Short Term Goal #1  Discussed with patient  importance of monitoring blood sugars.  [facility] CM Short Term Goal #2   Patient will report limiting carbohydrates in diet and limiting sweets.  [facility] CM Short Term Goal #2 Start Date  11/21/19  Interventions for Short Term Goal #2  RN CM discussed with patient current diet.  Discussed high carbohydrate foods such as bread, potatoes, and rice to limit.     RN CM will provide ongoing education and support to patient through phone calls.   RN CM will send welcome packet with consent to patient.   RN CM will send initial barriers letter, assessment, and care plan to primary care physician.   RN CM will contact patient in the next 2-3 weeks and patient agrees to next contact.  Jone Baseman, RN, MSN [facility] Care Management Coordinator Direct Line [phone] Cell [phone] Toll Free: [phone]  Fax: [phone]

## 2019-11-22 ENCOUNTER — Other Ambulatory Visit: Payer: Self-pay

## 2019-11-22 ENCOUNTER — Other Ambulatory Visit: Payer: Self-pay | Admitting: Family Medicine

## 2019-11-22 MED ORDER — METFORMIN HCL 500 MG PO TABS: 500.0000 mg | ORAL_TABLET | Freq: Two times a day (BID) | ORAL | 1 refills | Status: DC

## 2019-11-22 NOTE — Telephone Encounter (Signed)
Patient called and wants to know if he can have a refill of these medications I have pended. Please advise.

## 2019-11-23 ENCOUNTER — Other Ambulatory Visit: Payer: Self-pay | Admitting: Family Medicine

## 2019-11-23 ENCOUNTER — Other Ambulatory Visit (INDEPENDENT_AMBULATORY_CARE_PROVIDER_SITE_OTHER): Payer: Self-pay | Admitting: Orthopaedic Surgery

## 2019-11-23 MED ORDER — ASPIRIN 325 MG PO TABS: 325.0000 mg | ORAL_TABLET | Freq: Every day | ORAL | 1 refills | Status: DC

## 2019-11-23 MED ORDER — CLOPIDOGREL BISULFATE 75 MG PO TABS: 75.0000 mg | ORAL_TABLET | Freq: Every day | ORAL | 1 refills | Status: DC

## 2019-12-01 ENCOUNTER — Other Ambulatory Visit: Payer: Self-pay

## 2019-12-01 ENCOUNTER — Encounter: Payer: Self-pay | Admitting: Family Medicine

## 2019-12-01 ENCOUNTER — Ambulatory Visit (INDEPENDENT_AMBULATORY_CARE_PROVIDER_SITE_OTHER): Payer: Medicare PPO | Admitting: Family Medicine

## 2019-12-01 ENCOUNTER — Ambulatory Visit (INDEPENDENT_AMBULATORY_CARE_PROVIDER_SITE_OTHER): Payer: Medicare PPO

## 2019-12-01 VITALS — BP 130/80 | HR 61 | Temp 97.9°F | Ht 68.5 in | Wt 190.0 lb

## 2019-12-01 DIAGNOSIS — R32 Unspecified urinary incontinence: Secondary | ICD-10-CM

## 2019-12-01 DIAGNOSIS — M545 Low back pain, unspecified: Secondary | ICD-10-CM

## 2019-12-01 DIAGNOSIS — E119 Type 2 diabetes mellitus without complications: Secondary | ICD-10-CM

## 2019-12-01 DIAGNOSIS — I63442 Cerebral infarction due to embolism of left cerebellar artery: Secondary | ICD-10-CM | POA: Diagnosis not present

## 2019-12-01 IMAGING — DX DG LUMBAR SPINE COMPLETE 4+V
5 series · 5 of 5 positions shown · non-contrast
Comparison: [DATE]

CLINICAL DATA: Low back pain with sciatica

EXAM:
LUMBAR SPINE - COMPLETE 4+ VIEW

[l-spine ap]
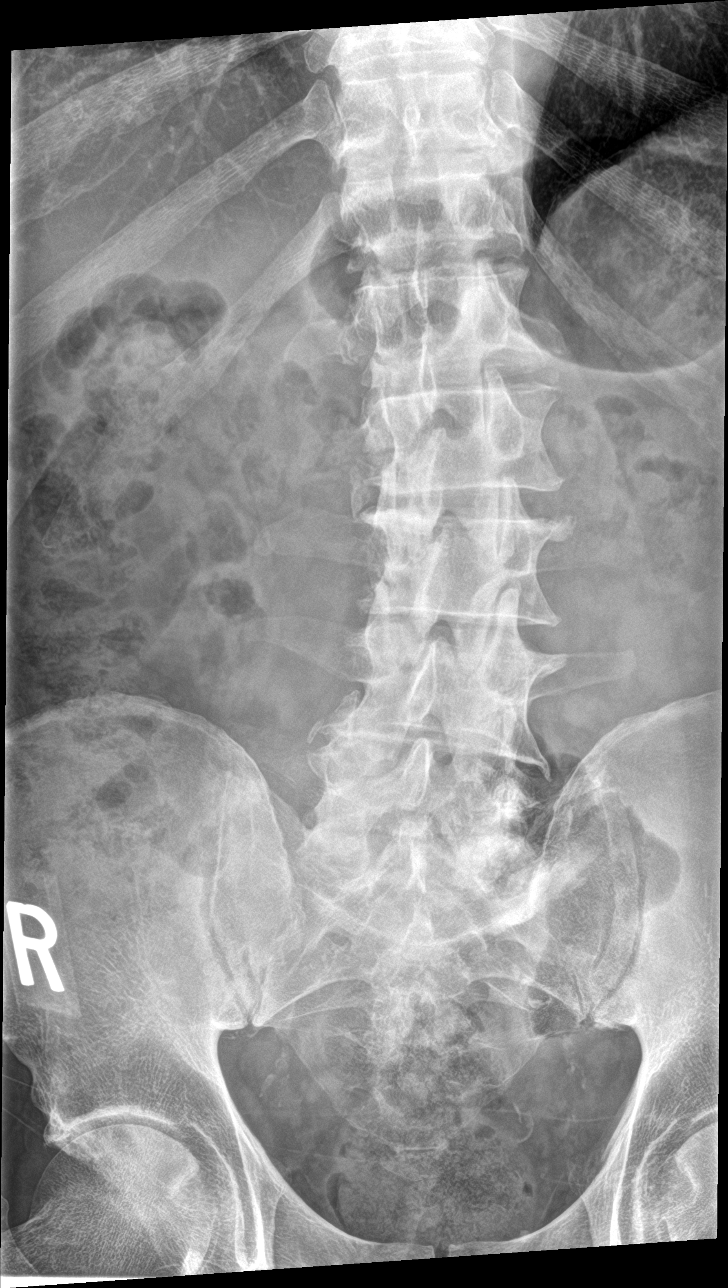

[l-spine obl (1 of 2)]
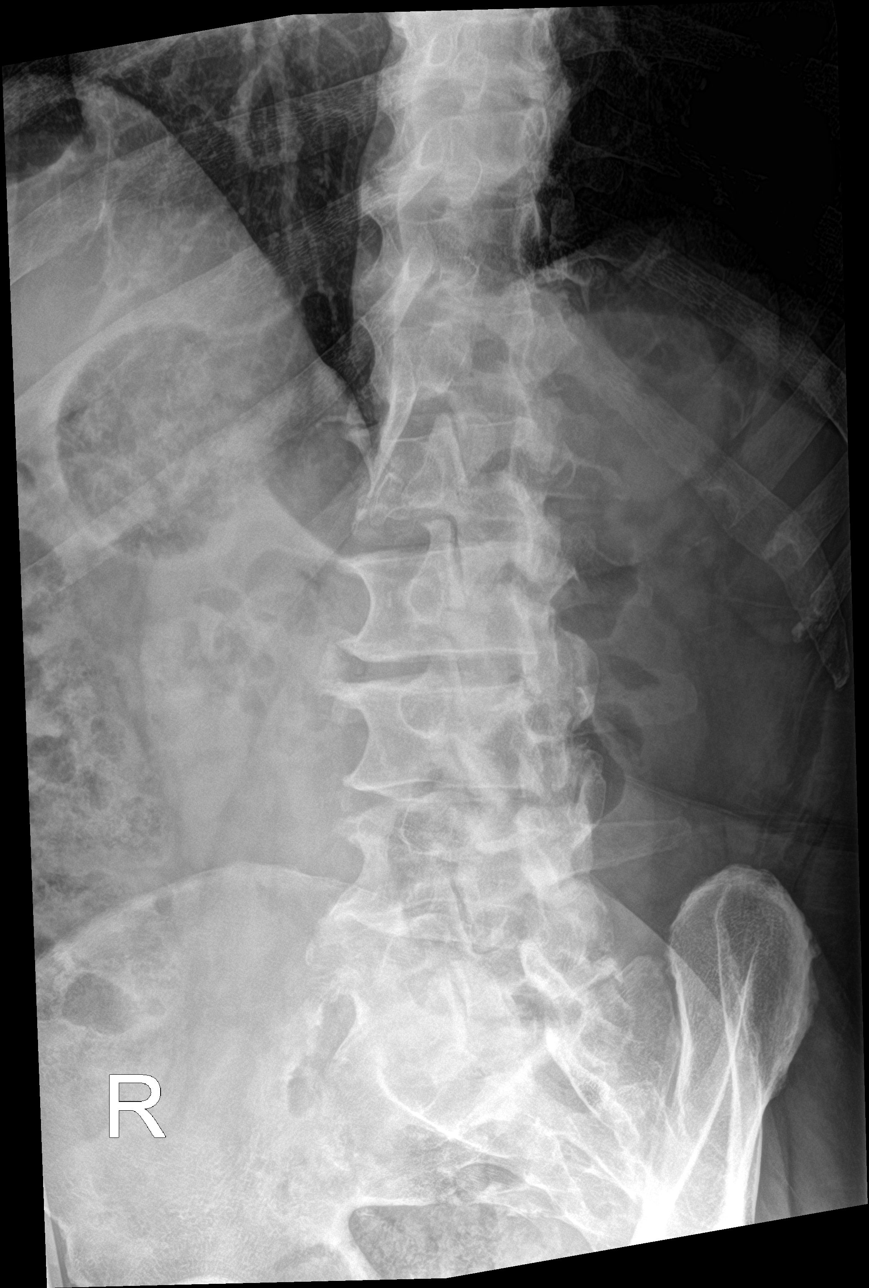

[l-spine obl (2 of 2)]
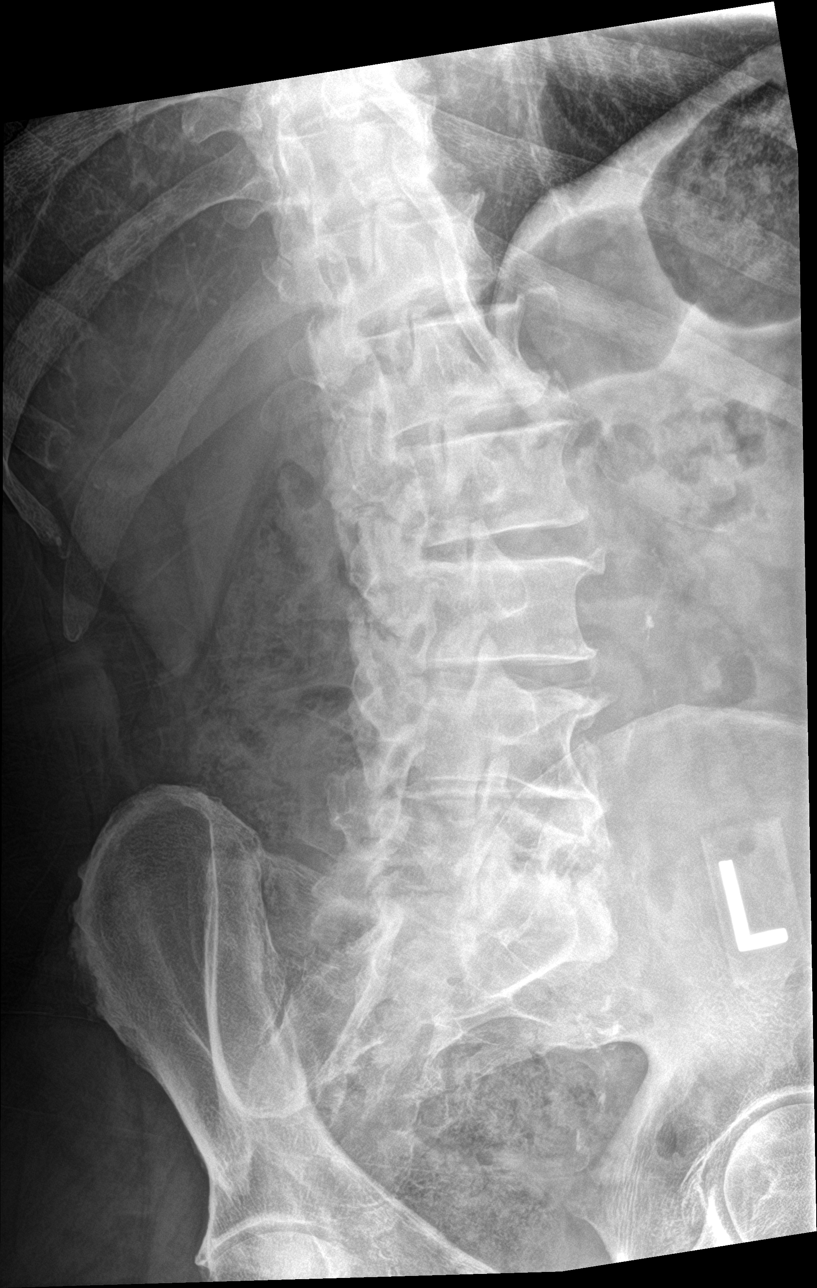

[l-spine lat]
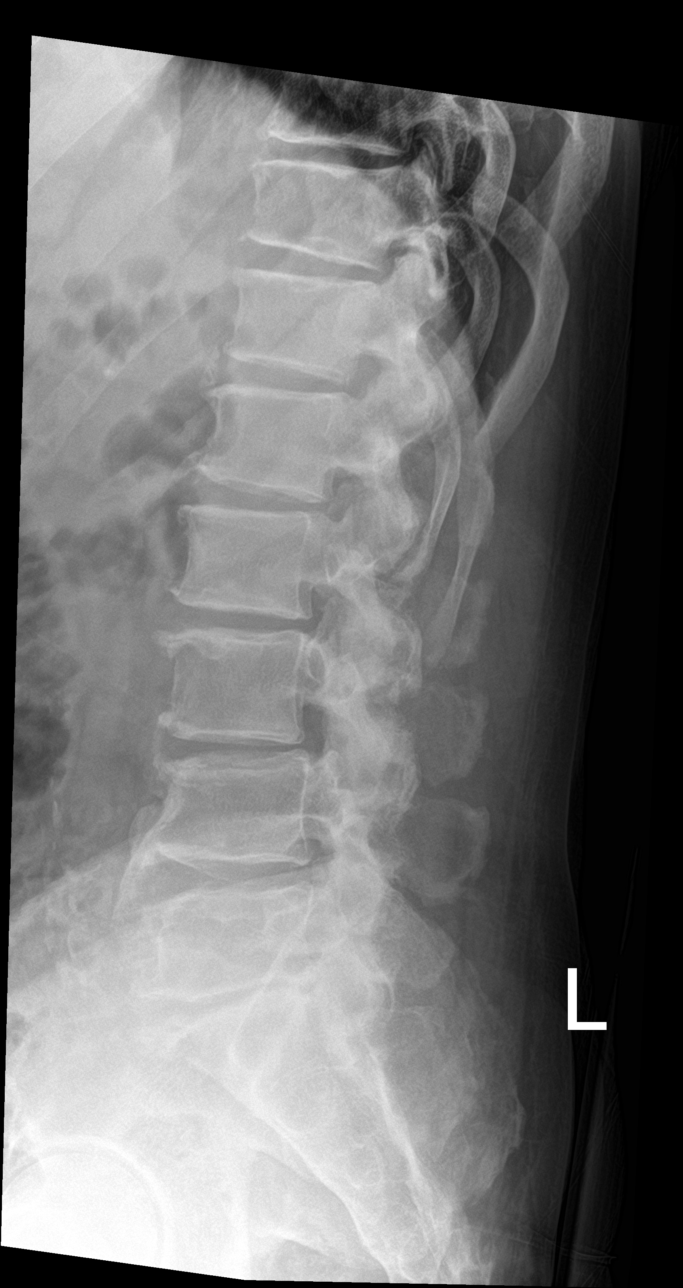

[l-spine spot]
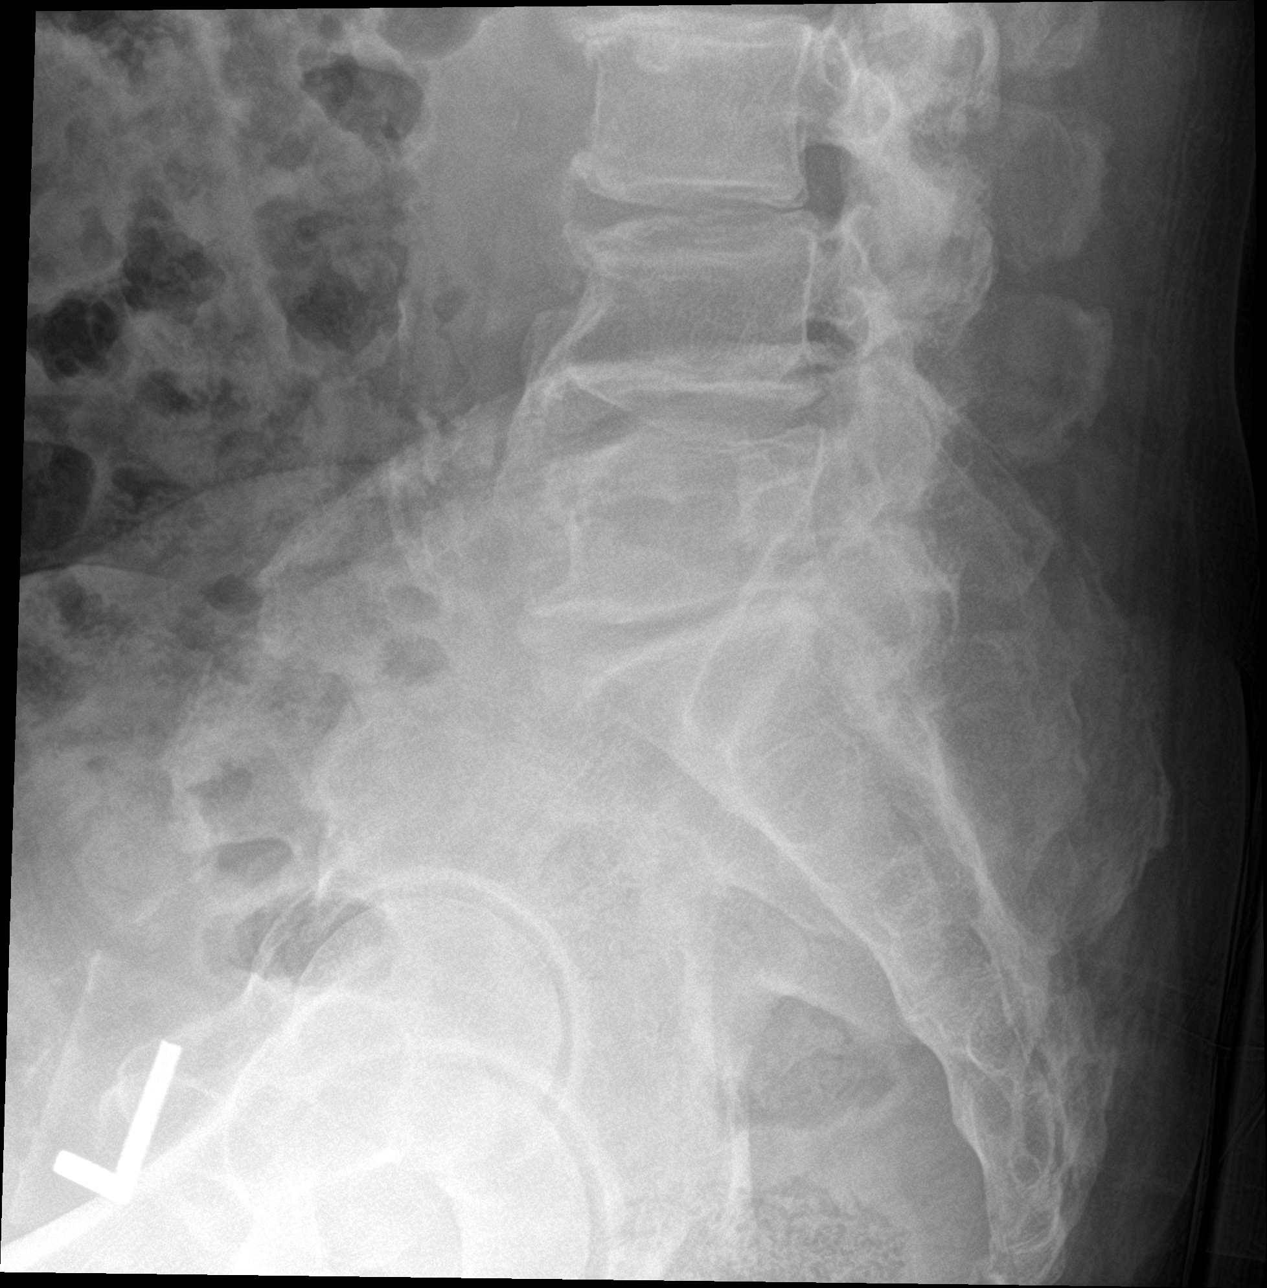

[5 of 5 positions shown; findings below may reference images not displayed]

FINDINGS: Mild levoscoliosis of the lumbar spine, unchanged. There is mild
height loss at L4 and L5, unchanged. Multilevel anterior
osteophytosis. Moderate lower lumbar facet arthrosis is unchanged.
Alignment is normal.
IMPRESSION: No acute abnormality of the lumbar spine. Unchanged mild height loss
at L4 and L5 and multilevel lower lumbar facet arthrosis.

## 2019-12-01 NOTE — Patient Instructions (Addendum)
Great to see you today!  Have labs and xrays completed today, we'll be in touch with results Follow up with me in about 2 months for routine diabetes follow up

## 2019-12-03 LAB — URINE CULTURE
MICRO NUMBER:: 10293891
SPECIMEN QUALITY:: ADEQUATE

## 2019-12-03 LAB — CBC
HCT: 37 % — ABNORMAL LOW (ref 38.5–50.0)
Hemoglobin: 11.5 g/dL — ABNORMAL LOW (ref 13.2–17.1)
MCH: 27.8 pg (ref 27.0–33.0)
MCHC: 31.1 g/dL — ABNORMAL LOW (ref 32.0–36.0)
MCV: 89.4 fL (ref 80.0–100.0)
MPV: 10.1 fL (ref 7.5–12.5)
Platelets: 311 10*3/uL (ref 140–400)
RBC: 4.14 10*6/uL — ABNORMAL LOW (ref 4.20–5.80)
RDW: 13.8 % (ref 11.0–15.0)
WBC: 4.7 10*3/uL (ref 3.8–10.8)

## 2019-12-03 LAB — URINALYSIS, ROUTINE W REFLEX MICROSCOPIC
Bilirubin Urine: NEGATIVE
Glucose, UA: NEGATIVE
Hgb urine dipstick: NEGATIVE
Hyaline Cast: NONE SEEN /LPF
Ketones, ur: NEGATIVE
Nitrite: POSITIVE — AB
Protein, ur: NEGATIVE
RBC / HPF: NONE SEEN /HPF (ref 0–2)
Specific Gravity, Urine: 1.012 (ref 1.001–1.03)
Squamous Epithelial / HPF: NONE SEEN /HPF (ref <=5)
pH: <=5 (ref 5.0–8.0)

## 2019-12-03 LAB — BASIC METABOLIC PANEL
BUN: 9 mg/dL (ref 7–25)
CO2: 27 mmol/L (ref 20–32)
Calcium: 9.3 mg/dL (ref 8.6–10.3)
Chloride: 108 mmol/L (ref 98–110)
Creat: 0.93 mg/dL (ref 0.70–1.18)
Glucose, Bld: 89 mg/dL (ref 65–99)
Potassium: 4.2 mmol/L (ref 3.5–5.3)
Sodium: 143 mmol/L (ref 135–146)

## 2019-12-04 ENCOUNTER — Encounter: Payer: Self-pay | Admitting: Family Medicine

## 2019-12-04 DIAGNOSIS — R32 Unspecified urinary incontinence: Secondary | ICD-10-CM | POA: Insufficient documentation

## 2019-12-04 NOTE — Assessment & Plan Note (Addendum)
  Check UA and send for culture.  He has history of BPH as well but denies retention symptoms.

## 2019-12-04 NOTE — Assessment & Plan Note (Signed)
X-rays of lumbar spine ordered. 

## 2019-12-04 NOTE — Progress Notes (Signed)
Aaron Mendez - 74 y.o. male MRN [id number]  Date of birth: 06/16/1946  Subjective Chief Complaint  Patient presents with  . Hospitalization Follow-up    HPI Aaron Mendez is a 74 y.o. male with history of T2DM, CVA of L cerebellar artery, BPH, and recent COVID-19 infection here today for follow up.  A virtual visit was completed with him after recent hospitalization on 11/10/2019 and he was doing well at that time.  He has completed rehab with [facility] PT/OT.  He feels that he has recovered well from COVID as well.  He is taking plavix and asa. He is compliant with statin and monitoring glucose at home regularly now.   Today his main complaint is low back pain with radiation into bilateral hips.  He denies radiation of pain.  He has had some urinary incontinence and frequency but denies problems bowels.   ROS:  A comprehensive ROS was completed and negative except as noted per HPI  No Known Allergies  Past Medical History:  Diagnosis Date  . Bilateral primary osteoarthritis of knee   . Sickle cell trait (HCC)     No past surgical history on file.  Social History   Socioeconomic History  . Marital status: Single    Spouse name: Not on file  . Number of children: Not on file  . Years of education: Not on file  . Highest education level: Not on file  Occupational History  . Not on file  Tobacco Use  . Smoking status: Never Smoker  . Smokeless tobacco: Never Used  Substance and Sexual Activity  . Alcohol use: No  . Drug use: No  . Sexual activity: Yes  Other Topics Concern  . Not on file  Social History Narrative  . Not on file   Social Determinants of Health   Financial Resource Strain:   . Difficulty of Paying Living Expenses:   Food Insecurity: No Food Insecurity  . Worried About Programme researcher, broadcasting/film/video in the Last Year: Never true  . Ran Out of Food in the Last Year: Never true  Transportation Needs: No Transportation Needs  . Lack of Transportation (Medical): No  . Lack of  Transportation (Non-Medical): No  Physical Activity:   . Days of Exercise per Week:   . Minutes of Exercise per Session:   Stress:   . Feeling of Stress :   Social Connections:   . Frequency of Communication with Friends and Family:   . Frequency of Social Gatherings with Friends and Family:   . Attends Religious Services:   . Active Member of Clubs or Organizations:   . Attends Banker Meetings:   Marland Kitchen Marital Status:     Family History  Problem Relation Age of Onset  . Sickle cell anemia Son     Health Maintenance  Topic Date Due  . OPHTHALMOLOGY EXAM  Never done  . URINE MICROALBUMIN  Never done  . COLONOSCOPY  11/08/2020 (Originally 11/05/1994)  . PNA vac Low Risk Adult (2 of 2 - PPSV23) 11/08/2020 (Originally 12/22/2018)  . FOOT EXAM  11/30/2020 (Originally [birth year]/1954)  . TETANUS/TDAP  11/30/2020 (Originally 11/06/1963)  . HEMOGLOBIN A1C  04/27/2020  . INFLUENZA VACCINE  Completed  . Hepatitis C Screening  Completed     ----------------------------------------------------------------------------------------------------------------------------------------------------------------------------------------------------------------- Physical Exam BP 130/80   Pulse 61   Temp 97.9 F (36.6 C) (Oral)   Ht 5' 8.5" (1.74 m)   Wt 190 lb (86.2 kg)   BMI 28.47 kg/m  Physical Exam Constitutional:      Appearance: Normal appearance.  HENT:     Head: Normocephalic and atraumatic.  Eyes:     General: No scleral icterus. Cardiovascular:     Rate and Rhythm: Normal rate and regular rhythm.  Pulmonary:     Effort: Pulmonary effort is normal.     Breath sounds: Normal breath sounds.  Musculoskeletal:     Cervical back: Neck supple.     Comments: ROM of L spine is limited with flexion.  Non-tender paraspinals without spasm.  Negative SLR.  Strength is 5/5 bilaterally.   Skin:    General: Skin is warm.  Neurological:     General: No focal deficit present.      Mental Status: He is alert.  Psychiatric:        Mood and Affect: Mood normal.        Behavior: Behavior normal.     ------------------------------------------------------------------------------------------------------------------------------------------------------------------------------------------------------------------- Assessment and Plan  Acute bilateral low back pain without sciatica Xrays of lumbar spine ordered  Urinary incontinence  Check UA and send for culture.  He has history of BPH as well but denies retention symptoms.   Type 2 diabetes mellitus without complications ([place]) He is monitoring glucose at home with good readings.   He will continue metformin at this time.   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  He will continue plavix + ASA x3 months then ASA alone.  Continue statin.  Update labs.    No orders of the defined types were placed in this encounter.   Return in about 8 weeks (around 01/26/2020).    This visit occurred during the SARS-CoV-2 public health emergency.  Safety protocols were in place, including screening questions prior to the visit, additional usage of staff PPE, and extensive cleaning of exam room while observing appropriate contact time as indicated for disinfecting solutions.

## 2019-12-04 NOTE — Assessment & Plan Note (Signed)
He is monitoring glucose at home with good readings.   He will continue metformin at this time.

## 2019-12-04 NOTE — Assessment & Plan Note (Signed)
Stable without significant residual deficits.  He will continue plavix + ASA x3 months then ASA alone.  Continue statin.  Update labs.

## 2019-12-05 ENCOUNTER — Encounter: Payer: Self-pay | Admitting: Family Medicine

## 2019-12-05 ENCOUNTER — Other Ambulatory Visit: Payer: Self-pay | Admitting: Family Medicine

## 2019-12-05 MED ORDER — SULFAMETHOXAZOLE-TRIMETHOPRIM 800-160 MG PO TABS: 1.0000 | ORAL_TABLET | Freq: Two times a day (BID) | ORAL | 0 refills | Status: AC

## 2019-12-08 ENCOUNTER — Other Ambulatory Visit: Payer: Self-pay

## 2019-12-08 NOTE — Patient Outreach (Signed)
[facility] ([facility]) Care Management  [facility] Care Manager  12/08/2019   Aaron Mendez 08/26/1946 [id number]  Subjective: Telephone call to patient for follow up. Patient reports he is doing good.  He states he is having some back issues for which he saw the doctor. Discussed pain management with patient.  Patient reports that his sugars are doing good with last check 95.  Discussed importance of sugar control.  He verbalized understanding and voices no concerns.   Objective:   Encounter Medications:  Outpatient Encounter Medications as of 12/08/2019  Medication Sig  . Accu-Chek Softclix Lancets lancets Use to check glucose up to two times per day.  Dx: E11.9  . aspirin 325 MG tablet Take 1 tablet (325 mg total) by mouth daily.  . atorvastatin (LIPITOR) 20 MG tablet TAKE 1 TABLET(20 MG) BY MOUTH DAILY  . Blood Glucose Monitoring Suppl (ACCU-CHEK AVIVA PLUS) w/Device KIT Use to check glucose up to two times per day.  Dx: E11.9  . clopidogrel (PLAVIX) 75 MG tablet Take 1 tablet (75 mg total) by mouth daily.  . glucose blood (ACCU-CHEK AVIVA PLUS) test strip Use to check glucose up to two times per day.  Dx: E11.9  . meloxicam (MOBIC) 7.5 MG tablet TAKE 2 TABLETS BY MOUTH DAILY AS NEEDED FOR PAIN. TAKE ONCE YOU'VE COMPLETED STEROID PACK  . metFORMIN (GLUCOPHAGE) 500 MG tablet Take 1 tablet (500 mg total) by mouth 2 (two) times daily with a meal.  . Multiple Vitamin (MULTIVITAMIN WITH MINERALS) TABS tablet Take 1 tablet by mouth daily.  . sulfamethoxazole-trimethoprim (BACTRIM DS) 800-160 MG tablet Take 1 tablet by mouth 2 (two) times daily for 10 days.   No facility-administered encounter medications on file as of 12/08/2019.    Functional Status:  In your present state of health, do you have any difficulty performing the following activities: 11/21/2019 10/12/2019  Hearing? N N  Vision? N N  Difficulty concentrating or making decisions? N N  Walking or climbing stairs? N N  Dressing or  bathing? N N  Doing errands, shopping? N N  Preparing Food and eating ? N -  Using the Toilet? N -  In the past six months, have you accidently leaked urine? N -  Do you have problems with loss of bowel control? N -  Managing your Medications? Y -  Comment friend places in pill box for him -  Managing your Finances? N -  Housekeeping or managing your Housekeeping? N -  Some recent data might be hidden    Fall/Depression Screening: Fall Risk  11/21/2019 10/19/2019 05/06/2019  Falls in the past year? 0 0 0   PHQ 2/9 Scores 11/21/2019 10/19/2019 05/06/2019 12/21/2017  PHQ - 2 Score 0 0 0 0    Assessment:  Patient continues stroke recovery. Patient managing good per patient report.   Plan:  [facility] CM Care Plan Problem One     Most Recent Value  Care Plan Problem One  Recent diagnosis of diabetes/recent hospitalization stroke & COVID 19  Role Documenting the Problem One  Care Management Telephonic Coordinator  Care Plan for Problem One  Active  [facility] Long Term Goal   Patient will not admit to hospital within the next 31 days.  [facility] Long Term Goal Start Date  11/21/19  Interventions for Problem One Long Term Goal  Patient recovering well from stroke.  Offers no concerns.   [facility] CM Short Term Goal #1   Patient will continue to check blood sugars at   least daily within the next 30 days.  [facility] CM Short Term Goal #1 Start Date  11/21/19  [facility] CM Short Term Goal #1 Met Date  12/08/19  [facility] CM Short Term Goal #2   Patient will report limiting carbohydrates in diet and limiting sweets.  [facility] CM Short Term Goal #2 Start Date  11/21/19  Interventions for Short Term Goal #2  Patient reports watching sweets and starchy foods such as bread and potatoes.  Discussed importance of blood sugar control.       RN CM will contact patient again in the month of April and patient agreeable.    Dionne J Leath, RN, MSN [facility] Care Management Care Management Coordinator Direct Line [phone] Cell [phone] Toll  Free: [phone]  Fax: [phone]   

## 2019-12-12 ENCOUNTER — Encounter: Payer: Self-pay | Admitting: Adult Health

## 2019-12-12 ENCOUNTER — Ambulatory Visit: Payer: Medicare PPO | Admitting: Adult Health

## 2019-12-12 ENCOUNTER — Other Ambulatory Visit: Payer: Self-pay

## 2019-12-12 VITALS — BP 112/73 | HR 84 | Temp 97.9°F | Ht 70.0 in | Wt 190.0 lb

## 2019-12-12 DIAGNOSIS — I639 Cerebral infarction, unspecified: Secondary | ICD-10-CM

## 2019-12-12 DIAGNOSIS — E785 Hyperlipidemia, unspecified: Secondary | ICD-10-CM | POA: Diagnosis not present

## 2019-12-12 DIAGNOSIS — I6502 Occlusion and stenosis of left vertebral artery: Secondary | ICD-10-CM

## 2019-12-12 DIAGNOSIS — E119 Type 2 diabetes mellitus without complications: Secondary | ICD-10-CM | POA: Diagnosis not present

## 2019-12-12 MED ORDER — ROSUVASTATIN CALCIUM 10 MG PO TABS: 10.0000 mg | ORAL_TABLET | Freq: Every day | ORAL | 0 refills | Status: DC

## 2019-12-12 NOTE — Progress Notes (Signed)
I agree with the above plan 

## 2019-12-12 NOTE — Patient Instructions (Addendum)
Continue aspirin 325 mg daily and clopidogrel 75 mg daily  And start Crestor 10mg  daily for secondary stroke prevention  Continue plavix until around May 19th and then stop and continue on aspirin alone  Stop lipitor at this time as this medication may be causing you to have joint pain  Continue to follow up with PCP regarding cholesterol, blood pressure and diabetes management   Continue to monitor blood pressure at home  Maintain strict control of hypertension with blood pressure goal below 130/90, diabetes with hemoglobin A1c goal below 6.5% and cholesterol with LDL cholesterol (bad cholesterol) goal below 70 mg/dL. I also advised the patient to eat a healthy diet with plenty of whole grains, cereals, fruits and vegetables, exercise regularly and maintain ideal body weight.  Followup in the future with me in 4 months or call earlier if needed       Thank you for coming to see May 21 at [facility]. I hope we have been able to provide you high quality care today.  You may receive a patient satisfaction survey over the next few weeks. We would appreciate your feedback and comments so that we may continue to improve ourselves and the health of our patients.

## 2019-12-12 NOTE — Progress Notes (Signed)
[facility] [address] [place]. [place] [address] [phone]       HOSPITAL FOLLOW UP NOTE  Mr. Jan Olano Date of Birth:  08-05-1946 Medical Record Number:  [id number]   Reason for Referral:  hospital stroke follow up    CHIEF COMPLAINT:  Chief Complaint  Patient presents with  . Follow-up    rm 9, hospital fu, with daughter,     HPI:  Mr. Darrnell Mangiaracina is a 74 y.o. male with history of sickle cell trait, steroid-induced hyperglycemia, recent Covid pneumonia requiring admission from 2/2-2/8, hyperlipidemia, and previous strokes by imaging  who presented on 10/28/2019 with hyperglycemia, generalized weakness and recent confusion.   Evaluated by stroke team and Dr. 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  Recommended DAPT for 3 months and aspirin alone due to intracranial VA stenosis.  LDL 56 and recommend continuation of atorvastatin 20 mg daily.  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  Recommended continuation of Metformin and outpatient follow-up with PCP.  Prior small vessel infarctions on imaging involving pons, right thalamus and extensively throughout the cerebral hemispheric white matter.  Discharged home in stable condition with recommendation of home health therapies.  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   MRI head - Subacute infarction in the peripheral left midportion of the cerebellum. Mild swelling but no hemorrhage. Old small vessel infarctions of the pons, right thalamus and extensively throughout the cerebral hemispheric white matter.   CTA H&N - left VA  nondominant, left V1 occlusion with thready intermittent reconstitution.  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  Patient counseled to be compliant with his antithrombotic medications  Ongoing aggressive stroke risk factor management  Mr. Kittelson is being seen today for hospital follow-up accompanied by his daughter.  He continues to have mild LUE dysmetria but endorses improvement.  He also has complaints of bilateral hand and fingertip numbness and bilateral hip and thigh pain.  He reports symptoms present since recent stroke but then admitted to lower extremity pain possibly around the time of initiating statin approximately 6 months ago.  He has continued on atorvastatin.  He denies new or worsening stroke/TIA symptoms.  DAPT without bleeding or bruising.  Blood pressure today 112/73.  Glucose levels stable.  No further concerns at this time.    ROS:   14 system review of systems performed and negative with exception of numbness/tingling, decreased dexterity, muscle and joint pain  PMH:  Past Medical History:  Diagnosis Date  . Bilateral primary osteoarthritis of knee   . Sickle cell trait (HCC)     PSH: No past surgical history on file.  Social History:  Social History   Socioeconomic History  . Marital status: Single    Spouse name: Not on file  . Number of children: Not on file  . Years of  education: Not on file  . Highest education level: Not on file  Occupational History  . Not on file  Tobacco Use  . Smoking status: Never Smoker  . Smokeless tobacco: Never Used  Substance and Sexual Activity  . Alcohol use: No  . Drug use: No  . Sexual activity: Yes  Other Topics Concern  . Not on file  Social History  Narrative  . Not on file   Social Determinants of Health   Financial Resource Strain:   . Difficulty of Paying Living Expenses:   Food Insecurity: No Food Insecurity  . Worried About Charity fundraiser in the Last Year: Never true  . Ran Out of Food in the Last Year: Never true  Transportation Needs: No Transportation Needs  . Lack of Transportation (Medical): No  . Lack of Transportation (Non-Medical): No  Physical Activity:   . Days of Exercise per Week:   . Minutes of Exercise per Session:   Stress:   . Feeling of Stress :   Social Connections:   . Frequency of Communication with Friends and Family:   . Frequency of Social Gatherings with Friends and Family:   . Attends Religious Services:   . Active Member of Clubs or Organizations:   . Attends Archivist Meetings:   Marland Kitchen Marital Status:   Intimate Partner Violence:   . Fear of Current or Ex-Partner:   . Emotionally Abused:   Marland Kitchen Physically Abused:   . Sexually Abused:     Family History:  Family History  Problem Relation Age of Onset  . Sickle cell anemia Son     Medications:   Current Outpatient Medications on File Prior to Visit  Medication Sig Dispense Refill  . Accu-Chek Softclix Lancets lancets Use to check glucose up to two times per day.  Dx: E11.9 100 each 12  . aspirin 325 MG tablet Take 1 tablet (325 mg total) by mouth daily. 90 tablet 1  . atorvastatin (LIPITOR) 20 MG tablet TAKE 1 TABLET(20 MG) BY MOUTH DAILY 90 tablet 3  . Blood Glucose Monitoring Suppl (ACCU-CHEK AVIVA PLUS) w/Device KIT Use to check glucose up to two times per day.  Dx: E11.9 1 kit 0  . clopidogrel (PLAVIX) 75 MG tablet Take 1 tablet (75 mg total) by mouth daily. 90 tablet 1  . glucose blood (ACCU-CHEK AVIVA PLUS) test strip Use to check glucose up to two times per day.  Dx: E11.9 100 each 12  . meloxicam (MOBIC) 7.5 MG tablet TAKE 2 TABLETS BY MOUTH DAILY AS NEEDED FOR PAIN. TAKE ONCE YOU'VE COMPLETED STEROID PACK 180 tablet 2   . metFORMIN (GLUCOPHAGE) 500 MG tablet Take 1 tablet (500 mg total) by mouth 2 (two) times daily with a meal. 180 tablet 1  . Multiple Vitamin (MULTIVITAMIN WITH MINERALS) TABS tablet Take 1 tablet by mouth daily.    Marland Kitchen sulfamethoxazole-trimethoprim (BACTRIM DS) 800-160 MG tablet Take 1 tablet by mouth 2 (two) times daily for 10 days. 20 tablet 0   No current facility-administered medications on file prior to visit.    Allergies:  No Known Allergies   Physical Exam  Vitals:   12/12/19 1450  BP: 112/73  Pulse: 84  Temp: 97.9 F (36.6 C)  Weight: 190 lb (86.2 kg)  Height: 5' 10"  (1.778 m)   Body mass index is 27.26 kg/m. No exam data present  General: well developed, well nourished,  pleasant elderly African-American male, seated, in no evident distress Head: head  normocephalic and atraumatic.   Neck: supple with no carotid or supraclavicular bruits Cardiovascular: regular rate and rhythm, no murmurs Musculoskeletal: no deformity Skin:  no rash/petichiae Vascular:  Normal pulses all extremities   Neurologic Exam Mental Status: Awake and fully alert.   Normal speech and language.  Oriented to place and time. Recent and remote memory intact. Attention span, concentration and fund of knowledge appropriate. Mood and affect appropriate.  Cranial Nerves: Fundoscopic exam reveals sharp disc margins. Pupils equal, briskly reactive to light. Extraocular movements full without nystagmus. Visual fields full to confrontation. Hearing intact. Facial sensation intact. Face, tongue, palate moves normally and symmetrically.  Motor: Normal bulk and tone. Normal strength in all tested extremity muscles. Sensory.: intact to touch , pinprick , position and vibratory sensation.  Coordination: Rapid alternating movements normal in all extremities except slightly decreased left hand. Finger-to-nose mild LUE dysmetria and heel-to-shin performed accurately bilaterally. Gait and Station: Arises from  chair without difficulty. Stance is normal. Gait demonstrates normal stride length and balance Reflexes: 1+ and symmetric. Toes downgoing.     NIHSS  1 Modified Rankin  2    ASSESSMENT: Alto Gandolfo is a 74 y.o. 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 Vascular risk factors include HLD, DM, sickle cell trait, intracranial stenosis, prior strokes on imaging and recent COVID-19 infection.  Residual deficits LUE dysmetria but endorses improvement.    PLAN:  1. Left cerebellum stroke:  a. Continue aspirin 325 mg daily and clopidogrel 75 mg daily  and atorvastatin for secondary stroke prevention.  Continue DAPT for total of 3 months and then aspirin alone (around 01/25/20).   b. Intracranial stenosis: Complete 3 months DAPT and then aspirin alone along with ongoing use of statin and importance of managing stroke risk factors. c. Maintain strict control of hypertension with blood pressure goal below 130/90, diabetes with hemoglobin A1c goal below 6.5% and cholesterol with LDL cholesterol (bad cholesterol) goal below 70 mg/dL.  I also advised the patient to eat a healthy diet with plenty of whole grains, cereals, fruits and vegetables, exercise regularly with at least 30 minutes of continuous activity daily and maintain ideal body weight. 2. HLD: Possible statin side effect of myalgias.  Recommend discontinuing atorvastatin and initiate Crestor 10 mg daily.  Request repeat lab work with PCP at next follow-up visit to ensure satisfactory management.  If myalgias do not resolve, recommend further evaluation by PCP 3. DMII: Stable.  Continue Metformin and ongoing follow-up with PCP 4. Bilateral fingertip numbness: Unknown etiology but symptoms have been stable without  worsening.  Advised to continue to monitor at this time as further evaluation or work-up would not change treatment plan.  Advised if worsening, follow-up with PCP for further evaluation     Follow up in 4 months or call earlier if needed   I spent 45 minutes of face-to-face and non-face-to-face time with patient.  This included previsit chart review, lab review, study review, order entry, electronic health record documentation, patient education regarding recent stroke, intracranial stenosis, managing secondary stroke risk factors and answered all questions to patient and daughter satisfaction    Frann Rider, AGNP-BC  [facility] [address] [place], [place] [address]  Phone [phone] Fax [phone] Note: This document was prepared with digital dictation and possible smart phrase  technology. Any transcriptional errors that result from this process are unintentional.

## 2019-12-22 ENCOUNTER — Telehealth: Payer: Self-pay | Admitting: Family Medicine

## 2019-12-22 NOTE — Telephone Encounter (Signed)
Patient called and he reports that he is still having so much pain in his back and legs. He has a follow up next week and wants to know if you have any recommendations before next week. He reports he is having trouble walking and getting around.  Please advise.

## 2019-12-26 NOTE — Telephone Encounter (Signed)
I would recommend tylenol 1000mg  every 8 hours as needed.

## 2019-12-28 ENCOUNTER — Ambulatory Visit (INDEPENDENT_AMBULATORY_CARE_PROVIDER_SITE_OTHER): Payer: Medicare PPO | Admitting: Family Medicine

## 2019-12-28 ENCOUNTER — Encounter: Payer: Self-pay | Admitting: Family Medicine

## 2019-12-28 ENCOUNTER — Other Ambulatory Visit: Payer: Self-pay

## 2019-12-28 VITALS — BP 137/80 | HR 68 | Ht 68.0 in | Wt 180.0 lb

## 2019-12-28 DIAGNOSIS — M545 Low back pain, unspecified: Secondary | ICD-10-CM

## 2019-12-28 DIAGNOSIS — M791 Myalgia, unspecified site: Secondary | ICD-10-CM | POA: Insufficient documentation

## 2019-12-28 DIAGNOSIS — N3943 Post-void dribbling: Secondary | ICD-10-CM | POA: Insufficient documentation

## 2019-12-28 LAB — POCT URINALYSIS DIP (CLINITEK)
Bilirubin, UA: NEGATIVE
Blood, UA: NEGATIVE
Glucose, UA: NEGATIVE mg/dL
Ketones, POC UA: NEGATIVE mg/dL
Leukocytes, UA: NEGATIVE
Nitrite, UA: NEGATIVE
POC PROTEIN,UA: NEGATIVE
Spec Grav, UA: 1.025 (ref 1.010–1.025)
Urobilinogen, UA: 1 E.U./dL
pH, UA: 5.5 (ref 5.0–8.0)

## 2019-12-28 NOTE — Assessment & Plan Note (Addendum)
Hold crestor x1 week to see if any improvement.  Check CK today.  Instructed to hydrate well.  He may continue tylenol as well for now.

## 2019-12-28 NOTE — Telephone Encounter (Signed)
Patient is going to discuss options at the visit today .

## 2019-12-28 NOTE — Progress Notes (Signed)
Patient daughter called and wanted to check and make sure what had went on at the visit. I told her the updated medication list and told her that the doctor was ordering some labs and we would let her know about any concerns. She was appreciative of the call back.

## 2019-12-28 NOTE — Patient Instructions (Addendum)
Have labs completed. We'll be in touch with results.  Hold crestor (rosuvastatin) for 1 week.  Let me know if pain improves with stopping this.   You may continue tylenol arthritis (650mg  1-2 tabs every 8 hours)

## 2019-12-28 NOTE — Progress Notes (Signed)
Aaron Mendez - 74 y.o. male MRN [id number]  Date of birth: September 03, 1946  Subjective Chief Complaint  Patient presents with  . Back Pain    HPI Michaeal Mendez is a 74 y.o. male with history of T2DM, CVA, and recent COVID 19 infection here today with complaint of back and leg pain.  Reports that back and leg pain started about 3-4 weeks ago.  Pain does not radiate.  He denies associated numbness or tingling.  He doesn't feel like eating sometimes due to pain and weight is down about 10 lbs over the past few weeks.  He has had some post void dribbling but denies dysuria.  He denies abdominal pain.  He has not had changes to bowels.  He denies fever or chills.   ROS:  A comprehensive ROS was completed and negative except as noted per HPI  No Known Allergies  Past Medical History:  Diagnosis Date  . Bilateral primary osteoarthritis of knee   . Sickle cell trait ([place])     History reviewed. No pertinent surgical history.  Social History   Socioeconomic History  . Marital status: Single    Spouse name: Not on file  . Number of children: Not on file  . Years of education: Not on file  . Highest education level: Not on file  Occupational History  . Not on file  Tobacco Use  . Smoking status: Never Smoker  . Smokeless tobacco: Never Used  Substance and Sexual Activity  . Alcohol use: No  . Drug use: No  . Sexual activity: Yes  Other Topics Concern  . Not on file  Social History Narrative  . Not on file   Social Determinants of Health   Financial Resource Strain:   . Difficulty of Paying Living Expenses:   Food Insecurity: No Food Insecurity  . Worried About Charity fundraiser in the Last Year: Never true  . Ran Out of Food in the Last Year: Never true  Transportation Needs: No Transportation Needs  . Lack of Transportation (Medical): No  . Lack of Transportation (Non-Medical): No  Physical Activity:   . Days of Exercise per Week:   . Minutes of Exercise per Session:    Stress:   . Feeling of Stress :   Social Connections:   . Frequency of Communication with Friends and Family:   . Frequency of Social Gatherings with Friends and Family:   . Attends Religious Services:   . Active Member of Clubs or Organizations:   . Attends Archivist Meetings:   Marland Kitchen Marital Status:     Family History  Problem Relation Age of Onset  . Sickle cell anemia Son     Health Maintenance  Topic Date Due  . URINE MICROALBUMIN  Never done  . OPHTHALMOLOGY EXAM  12/28/2019 (Originally 11/05/1955)  . COVID-19 Vaccine (1) 08/08/2020 (Originally 11/05/1960)  . COLONOSCOPY  11/08/2020 (Originally 11/05/1994)  . PNA vac Low Risk Adult (2 of 2 - PPSV23) 11/08/2020 (Originally 12/22/2018)  . FOOT EXAM  11/30/2020 (Originally 11/05/1955)  . TETANUS/TDAP  11/30/2020 (Originally 11/06/1963)  . INFLUENZA VACCINE  04/08/2020  . HEMOGLOBIN A1C  04/27/2020  . Hepatitis C Screening  Completed     ----------------------------------------------------------------------------------------------------------------------------------------------------------------------------------------------------------------- Physical Exam BP 137/80   Pulse 68   Ht 5\' 8"  (1.727 m)   Wt 180 lb (81.6 kg)   BMI 27.37 kg/m   Physical Exam Constitutional:      Appearance: Normal appearance.  HENT:  Head: Normocephalic and atraumatic.  Eyes:     General: No scleral icterus. Cardiovascular:     Rate and Rhythm: Normal rate and regular rhythm.  Pulmonary:     Effort: Pulmonary effort is normal.     Breath sounds: Normal breath sounds.  Musculoskeletal:        General: No tenderness.  Skin:    General: Skin is warm and dry.  Neurological:     General: No focal deficit present.     Mental Status: He is alert.  Psychiatric:        Mood and Affect: Mood normal.        Behavior: Behavior normal.      ------------------------------------------------------------------------------------------------------------------------------------------------------------------------------------------------------------------- Assessment and Plan  Myalgia Hold crestor x1 week to see if any improvement.  Check CK today.  Instructed to hydrate well.  He may continue tylenol as well for now.   Urinary dribbling Check UA today. Previous UTI treated with bactrim Check PSA as well given history of BPH   No orders of the defined types were placed in this encounter.   No follow-ups on file.    This visit occurred during the SARS-CoV-2 public health emergency.  Safety protocols were in place, including screening questions prior to the visit, additional usage of staff PPE, and extensive cleaning of exam room while observing appropriate contact time as indicated for disinfecting solutions.

## 2019-12-28 NOTE — Assessment & Plan Note (Addendum)
Check UA today. Previous UTI treated with bactrim Check PSA as well given history of BPH

## 2019-12-29 ENCOUNTER — Other Ambulatory Visit: Payer: Self-pay

## 2019-12-29 NOTE — Patient Outreach (Signed)
[facility] Care Management  [place]  12/29/2019   Taggart Prasad October 15, 1945 [id number]  Subjective: Telephone call to patient.  He states he is doing good but having some leg pain. He states he saw PCP yesterday and Crestor was stopped to see if this helps with leg pain.  Patient reports he is taking tylenol for pain as needed and follows up with physician next week. Patient reports he is doing good with his diabetes.  He states that he is exercising and really watching what he eats.  His last sugar reading was 129. Encouraged patient to continue to manage his diabetes.  He verbalized understanding but is concerned about his 10 lb weight loss over the last two weeks.  He states he discussed with physician but patient reports he is doing better with his diet and exercising and this may explain why the weight loss.  Advised patient to continue to monitor weight and to discuss with physician if weight loss continues.  He verbalized understanding and denies any needs.    Objective:   Encounter Medications:  Outpatient Encounter Medications as of 12/29/2019  Medication Sig  . Accu-Chek Softclix Lancets lancets Use to check glucose up to two times per day.  Dx: E11.9  . aspirin 325 MG tablet Take 1 tablet (325 mg total) by mouth daily.  . Blood Glucose Monitoring Suppl (ACCU-CHEK AVIVA PLUS) w/Device KIT Use to check glucose up to two times per day.  Dx: E11.9  . clopidogrel (PLAVIX) 75 MG tablet Take 1 tablet (75 mg total) by mouth daily.  Marland Kitchen glucose blood (ACCU-CHEK AVIVA PLUS) test strip Use to check glucose up to two times per day.  Dx: E11.9  . meloxicam (MOBIC) 7.5 MG tablet TAKE 2 TABLETS BY MOUTH DAILY AS NEEDED FOR PAIN. TAKE ONCE YOU'VE COMPLETED STEROID PACK  . metFORMIN (GLUCOPHAGE) 500 MG tablet Take 1 tablet (500 mg total) by mouth 2 (two) times daily with a meal.  . Multiple Vitamin (MULTIVITAMIN WITH MINERALS) TABS tablet Take 1 tablet by mouth daily.  .  rosuvastatin (CRESTOR) 10 MG tablet Take 1 tablet (10 mg total) by mouth daily.   No facility-administered encounter medications on file as of 12/29/2019.    Functional Status:  In your present state of health, do you have any difficulty performing the following activities: 11/21/2019 10/12/2019  Hearing? N N  Vision? N N  Difficulty concentrating or making decisions? N N  Walking or climbing stairs? N N  Dressing or bathing? N N  Doing errands, shopping? N N  Preparing Food and eating ? N -  Using the Toilet? N -  In the past six months, have you accidently leaked urine? N -  Do you have problems with loss of bowel control? N -  Managing your Medications? Y -  Comment friend places in pill box for him -  Managing your Finances? N -  Housekeeping or managing your Housekeeping? N -  Some recent data might be hidden    Fall/Depression Screening: Fall Risk  12/29/2019 11/21/2019 10/19/2019  Falls in the past year? 0 0 0   PHQ 2/9 Scores 12/29/2019 11/21/2019 10/19/2019 05/06/2019 12/21/2017  PHQ - 2 Score 0 0 0 0 0   SDOH Screenings   Alcohol Screen:   . Last Alcohol Screening Score (AUDIT):   Depression (PHQ2-9): Low Risk   . PHQ-2 Score: 0  Financial Resource Strain:   . Difficulty of Paying Living Expenses:   Food Insecurity: No  Food Insecurity  . Worried About Charity fundraiser in the Last Year: Never true  . Ran Out of Food in the Last Year: Never true  Housing:   . Last Housing Risk Score:   Physical Activity:   . Days of Exercise per Week:   . Minutes of Exercise per Session:   Social Connections:   . Frequency of Communication with Friends and Family:   . Frequency of Social Gatherings with Friends and Family:   . Attends Religious Services:   . Active Member of Clubs or Organizations:   . Attends Archivist Meetings:   Marland Kitchen Marital Status:   Stress: No Stress Concern Present  . Feeling of Stress : Not at all  Tobacco Use: Low Risk   . Smoking Tobacco Use:  Never Smoker  . Smokeless Tobacco Use: Never Used  Transportation Needs: No Transportation Needs  . Lack of Transportation (Medical): No  . Lack of Transportation (Non-Medical): No    Assessment: Patient continues to manage chronic illness.  Patient benefits from [facility] outreach and support.    Plan:  [facility] CM Care Plan Problem One     Most Recent Value  Care Plan Problem One  Recent diagnosis of diabetes/recent hospitalization stroke & COVID 19  Role Documenting the Problem One  Care Management Telephonic Coordinator  Care Plan for Problem One  Active  [facility] Long Term Goal   Patient will not admit to hospital within the next 31 days.  [facility] Long Term Goal Start Date  11/21/19  [facility] Long Term Goal Met Date  12/29/19  [facility] CM Short Term Goal #1   Patient will keep blood sugar less than 140 daily.   [facility] CM Short Term Goal #1 Start Date  12/29/19  Interventions for Short Term Goal #1  Patient checking sugar daily.  Last blood sugar check 129. Reiterated importance of diet and exercise with diabetes.       RN CM will contact patient in the month of May and patient agreeable.    Jone Baseman, RN, MSN [facility] Care Management Coordinator Direct Line [phone] Cell [phone] Toll Free: [phone]  Fax: [phone]

## 2020-01-04 ENCOUNTER — Other Ambulatory Visit: Payer: Self-pay

## 2020-01-04 ENCOUNTER — Ambulatory Visit (INDEPENDENT_AMBULATORY_CARE_PROVIDER_SITE_OTHER): Payer: Medicare PPO | Admitting: Family Medicine

## 2020-01-04 ENCOUNTER — Encounter: Payer: Self-pay | Admitting: Family Medicine

## 2020-01-04 VITALS — BP 129/76 | HR 71 | Wt 182.0 lb

## 2020-01-04 DIAGNOSIS — N3943 Post-void dribbling: Secondary | ICD-10-CM

## 2020-01-04 DIAGNOSIS — M545 Low back pain, unspecified: Secondary | ICD-10-CM

## 2020-01-04 DIAGNOSIS — R32 Unspecified urinary incontinence: Secondary | ICD-10-CM | POA: Diagnosis not present

## 2020-01-04 LAB — COMPLETE METABOLIC PANEL WITH GFR
AG Ratio: 1.6 (calc) (ref 1.0–2.5)
ALT: 12 U/L (ref 9–46)
AST: 15 U/L (ref 10–35)
Albumin: 4.6 g/dL (ref 3.6–5.1)
Alkaline phosphatase (APISO): 37 U/L (ref 35–144)
BUN: 11 mg/dL (ref 7–25)
CO2: 25 mmol/L (ref 20–32)
Calcium: 9.9 mg/dL (ref 8.6–10.3)
Chloride: 107 mmol/L (ref 98–110)
Creat: 0.75 mg/dL (ref 0.70–1.18)
GFR, Est African American: 104 mL/min/{1.73_m2} (ref >=60)
GFR, Est Non African American: 90 mL/min/{1.73_m2} (ref >=60)
Globulin: 2.9 g/dL (calc) (ref 1.9–3.7)
Glucose, Bld: 73 mg/dL (ref 65–139)
Potassium: 4.2 mmol/L (ref 3.5–5.3)
Sodium: 141 mmol/L (ref 135–146)
Total Bilirubin: 0.6 mg/dL (ref 0.2–1.2)
Total Protein: 7.5 g/dL (ref 6.1–8.1)

## 2020-01-04 LAB — CK: Total CK: 99 U/L (ref 44–196)

## 2020-01-04 LAB — PSA, TOTAL AND FREE

## 2020-01-04 LAB — PSA: PSA: 1.4 ng/mL (ref <=4.0)

## 2020-01-04 MED ORDER — TRAMADOL HCL 50 MG PO TABS: 50.0000 mg | ORAL_TABLET | Freq: Three times a day (TID) | ORAL | 0 refills | Status: DC | PRN

## 2020-01-04 NOTE — Patient Instructions (Addendum)
Lets reduce the metformin to just once per day.  Restart crestor (rosuvastatin) as it doesn't seem that this is causing your muscle aches.  Stop downstairs for labs.  I have sent tramadol in to help with pain.  Use tylenol initially but if still having pain you can use tramadol in addition to this.  See me again in about 2 weeks

## 2020-01-04 NOTE — Assessment & Plan Note (Signed)
Check PSA.  Previous UA normal.

## 2020-01-04 NOTE — Assessment & Plan Note (Signed)
Likely related to arthritis.  Checking PSA as well.  Will add tramadol to help with pain control as he is at increased risk of GI bleed with nsaids in addition to ASA and plavix.

## 2020-01-04 NOTE — Progress Notes (Signed)
Aaron Mendez - 74 y.o. male MRN [id number]  Date of birth: 10-09-1945  Subjective Chief Complaint  Patient presents with  . Leg Pain    HPI Aaron Mendez is a 74 y.o. male here today for follow up of low back and leg pain.  We tried holding his crestor for a week but he had no improvement with this.  He has had some urinary symptoms, UA normal last week.  Unfortunately PSA was not drawn with lab work collected last week. Previous xray with degenerative changes and facet arthritis.  He is taking tylenol which helps some but still continues to have pain.    He also reports some weight loss, weight is actually up since last visit.  He would like to try cutting back on metformin.    ROS:  A comprehensive ROS was completed and negative except as noted per HPI  No Known Allergies  Past Medical History:  Diagnosis Date  . Bilateral primary osteoarthritis of knee   . Sickle cell trait (HCC)     History reviewed. No pertinent surgical history.  Social History   Socioeconomic History  . Marital status: Single    Spouse name: Not on file  . Number of children: Not on file  . Years of education: Not on file  . Highest education level: Not on file  Occupational History  . Not on file  Tobacco Use  . Smoking status: Never Smoker  . Smokeless tobacco: Never Used  Substance and Sexual Activity  . Alcohol use: No  . Drug use: No  . Sexual activity: Yes  Other Topics Concern  . Not on file  Social History Narrative  . Not on file   Social Determinants of Health   Financial Resource Strain:   . Difficulty of Paying Living Expenses:   Food Insecurity: No Food Insecurity  . Worried About Programme researcher, broadcasting/film/video in the Last Year: Never true  . Ran Out of Food in the Last Year: Never true  Transportation Needs: No Transportation Needs  . Lack of Transportation (Medical): No  . Lack of Transportation (Non-Medical): No  Physical Activity:   . Days of Exercise per Week:   . Minutes of  Exercise per Session:   Stress: No Stress Concern Present  . Feeling of Stress : Not at all  Social Connections:   . Frequency of Communication with Friends and Family:   . Frequency of Social Gatherings with Friends and Family:   . Attends Religious Services:   . Active Member of Clubs or Organizations:   . Attends Banker Meetings:   Marland Kitchen Marital Status:     Family History  Problem Relation Age of Onset  . Sickle cell anemia Son     Health Maintenance  Topic Date Due  . OPHTHALMOLOGY EXAM  Never done  . URINE MICROALBUMIN  Never done  . COVID-19 Vaccine (1) 08/08/2020 (Originally 11/05/1960)  . COLONOSCOPY  11/08/2020 (Originally 11/05/1994)  . PNA vac Low Risk Adult (2 of 2 - PPSV23) 11/08/2020 (Originally 12/22/2018)  . FOOT EXAM  11/30/2020 (Originally 11/05/1954)  . TETANUS/TDAP  11/30/2020 (Originally 11/06/1963)  . INFLUENZA VACCINE  04/08/2020  . HEMOGLOBIN A1C  04/27/2020  . Hepatitis C Screening  Completed     ----------------------------------------------------------------------------------------------------------------------------------------------------------------------------------------------------------------- Physical Exam BP 129/76   Pulse 71   Wt 182 lb (82.6 kg)   SpO2 99%   BMI 27.67 kg/m   Physical Exam Constitutional:      Appearance: Normal appearance.  HENT:     Head: Normocephalic and atraumatic.  Eyes:     General: No scleral icterus. Cardiovascular:     Rate and Rhythm: Normal rate and regular rhythm.  Pulmonary:     Effort: Pulmonary effort is normal.     Breath sounds: Normal breath sounds.  Musculoskeletal:     Cervical back: Neck supple.     Comments: Limited ROM of lumbar spine.   Skin:    General: Skin is warm and dry.  Neurological:     Mental Status: He is alert.  Psychiatric:        Mood and Affect: Mood normal.        Behavior: Behavior normal.      ------------------------------------------------------------------------------------------------------------------------------------------------------------------------------------------------------------------- Assessment and Plan  Urinary dribbling Check PSA.  Previous UA normal.    Acute bilateral low back pain without sciatica Likely related to arthritis.  Checking PSA as well.  Will add tramadol to help with pain control as he is at increased risk of GI bleed with nsaids in addition to ASA and plavix.    Meds ordered this encounter  Medications  . traMADol (ULTRAM) 50 MG tablet    Sig: Take 1 tablet (50 mg total) by mouth every 8 (eight) hours as needed for up to 5 days for moderate pain or severe pain.    Dispense:  15 tablet    Refill:  0    Return in about 2 weeks (around 01/18/2020) for back pain.    This visit occurred during the SARS-CoV-2 public health emergency.  Safety protocols were in place, including screening questions prior to the visit, additional usage of staff PPE, and extensive cleaning of exam room while observing appropriate contact time as indicated for disinfecting solutions.

## 2020-01-17 ENCOUNTER — Other Ambulatory Visit: Payer: Self-pay | Admitting: Family Medicine

## 2020-01-17 DIAGNOSIS — M545 Low back pain, unspecified: Secondary | ICD-10-CM

## 2020-01-17 NOTE — Telephone Encounter (Signed)
CM-Plz see refill req/thx dmf 

## 2020-01-18 ENCOUNTER — Ambulatory Visit (INDEPENDENT_AMBULATORY_CARE_PROVIDER_SITE_OTHER): Payer: Medicare PPO | Admitting: Family Medicine

## 2020-01-18 ENCOUNTER — Other Ambulatory Visit: Payer: Self-pay

## 2020-01-18 ENCOUNTER — Encounter: Payer: Self-pay | Admitting: Family Medicine

## 2020-01-18 VITALS — BP 129/95 | HR 63 | Ht 68.11 in | Wt 179.7 lb

## 2020-01-18 DIAGNOSIS — M79605 Pain in left leg: Secondary | ICD-10-CM | POA: Diagnosis not present

## 2020-01-18 DIAGNOSIS — M79604 Pain in right leg: Secondary | ICD-10-CM

## 2020-01-18 DIAGNOSIS — R634 Abnormal weight loss: Secondary | ICD-10-CM

## 2020-01-18 DIAGNOSIS — M545 Low back pain, unspecified: Secondary | ICD-10-CM

## 2020-01-18 MED ORDER — TRAMADOL HCL 50 MG PO TABS: 50.0000 mg | ORAL_TABLET | Freq: Three times a day (TID) | ORAL | 1 refills | Status: AC | PRN

## 2020-01-18 NOTE — Patient Instructions (Addendum)
I have sent over some additional tramadol.  Use tylenol as needed for mild to moderate pain.  Tramadol for moderate to severe pain I have entered orders for physical therapy, they will contact you for appt.

## 2020-01-18 NOTE — Assessment & Plan Note (Signed)
Has had improvement with tramadol, rx renewed.  If needing long term will need to get him on pain contract.  Referral entered to PT.  If worsening or not improving with this may need to get MRI.

## 2020-01-18 NOTE — Assessment & Plan Note (Signed)
Stable at this time, continue to monitor 

## 2020-01-18 NOTE — Progress Notes (Signed)
Aaron Mendez - 74 y.o. male MRN [id number]  Date of birth: 1946-02-20  Subjective Chief Complaint  Patient presents with  . Back Pain    HPI Aaron Mendez is 74 y.o. male here today for follow up of back pain. 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 He has had UA and PSA completed as well which were normal.  He also complains of some bilateral thigh pain.  We tried holding his statin without improvement. He has had some improvement with tramadol.  He is also concerned about weight loss.  Reports good appetite.    ROS:  A comprehensive ROS was completed and negative except as noted per HPI  No Known Allergies  Past Medical History:  Diagnosis Date  . Bilateral primary osteoarthritis of knee   . Sickle cell trait (HCC)     History reviewed. No pertinent surgical history.  Social History   Socioeconomic History  . Marital status: Single    Spouse name: Not on file  . Number of children: Not on file  . Years of education: Not on file  . Highest education level: Not on file  Occupational History  . Not on file  Tobacco Use  . Smoking status: Never Smoker  . Smokeless tobacco: Never Used  Substance and Sexual Activity  . Alcohol use: No  . Drug use: No  . Sexual activity: Yes  Other Topics Concern  . Not on file  Social History Narrative  . Not on file   Social Determinants of Health   Financial Resource Strain:   . Difficulty of Paying Living Expenses:   Food Insecurity: No Food Insecurity  . Worried About Programme researcher, broadcasting/film/video in the Last Year: Never true  . Ran Out of Food in the Last Year: Never true  Transportation Needs: No Transportation Needs  . Lack of Transportation (Medical): No  . Lack of Transportation (Non-Medical): No  Physical Activity:   . Days of Exercise per Week:   . Minutes of Exercise per Session:   Stress: No Stress Concern Present  . Feeling of Stress : Not at all  Social Connections:   . Frequency of Communication with  Friends and Family:   . Frequency of Social Gatherings with Friends and Family:   . Attends Religious Services:   . Active Member of Clubs or Organizations:   . Attends Banker Meetings:   Marland Kitchen Marital Status:     Family History  Problem Relation Age of Onset  . Sickle cell anemia Son     Health Maintenance  Topic Date Due  . OPHTHALMOLOGY EXAM  Never done  . URINE MICROALBUMIN  Never done  . COVID-19 Vaccine (1) 08/08/2020 (Originally 11/05/1960)  . COLONOSCOPY  11/08/2020 (Originally 11/05/1994)  . PNA vac Low Risk Adult (2 of 2 - PPSV23) 11/08/2020 (Originally 12/22/2018)  . FOOT EXAM  11/30/2020 (Originally 11/05/1954)  . TETANUS/TDAP  11/30/2020 (Originally 11/06/1963)  . INFLUENZA VACCINE  04/08/2020  . HEMOGLOBIN A1C  04/27/2020  . Hepatitis C Screening  Completed     ----------------------------------------------------------------------------------------------------------------------------------------------------------------------------------------------------------------- Physical Exam BP (!) 129/95 (BP Location: Left Arm, Patient Position: Sitting, Cuff Size: Large)   Pulse 63   Ht 5' 8.11" (1.73 m)   Wt 179 lb 11.2 oz (81.5 kg)   SpO2 100%   BMI 27.24 kg/m   Physical Exam Constitutional:      Appearance: Normal appearance.  Eyes:     General: No scleral icterus. Cardiovascular:  Rate and Rhythm: Normal rate and regular rhythm.  Pulmonary:     Effort: Pulmonary effort is normal.     Breath sounds: Normal breath sounds.  Musculoskeletal:     Cervical back: Neck supple.  Neurological:     General: No focal deficit present.     Mental Status: He is alert.  Psychiatric:        Mood and Affect: Mood normal.        Behavior: Behavior normal.      ------------------------------------------------------------------------------------------------------------------------------------------------------------------------------------------------------------------- Assessment and Plan  Acute bilateral low back pain without sciatica Has had improvement with tramadol, rx renewed.  If needing long term will need to get him on pain contract.  Referral entered to PT.  If worsening or not improving with this may need to get MRI.    Weight loss Stable at this time, continue to monitor.     Meds ordered this encounter  Medications  . traMADol (ULTRAM) 50 MG tablet    Sig: Take 1 tablet (50 mg total) by mouth every 8 (eight) hours as needed for up to 5 days for moderate pain or severe pain.    Dispense:  45 tablet    Refill:  1    Return in about 6 weeks (around 02/29/2020) for back pain.    This visit occurred during the SARS-CoV-2 public health emergency.  Safety protocols were in place, including screening questions prior to the visit, additional usage of staff PPE, and extensive cleaning of exam room while observing appropriate contact time as indicated for disinfecting solutions.

## 2020-01-26 ENCOUNTER — Other Ambulatory Visit: Payer: Self-pay

## 2020-01-26 NOTE — Patient Outreach (Signed)
[facility]) Care Management  01/26/2020  Aaron Mendez 10/29/1945 [id number]   Telephone call to patient for disease management follow up. No answer. HIPAA compliant voice message left.   Plan: RN CM will attempt patient again in the moth of July and send letter.    [name], RN, MSN [facility] Care Management Care Management Coordinator Direct Line [phone] Cell [phone] Toll Free: [phone]  Fax: [phone]

## 2020-02-21 ENCOUNTER — Ambulatory Visit: Payer: Medicare PPO | Attending: Family Medicine | Admitting: Physical Therapy

## 2020-02-21 ENCOUNTER — Encounter: Payer: Self-pay | Admitting: Physical Therapy

## 2020-02-21 ENCOUNTER — Other Ambulatory Visit: Payer: Self-pay

## 2020-02-21 DIAGNOSIS — M62830 Muscle spasm of back: Secondary | ICD-10-CM | POA: Diagnosis present

## 2020-02-21 DIAGNOSIS — M5441 Lumbago with sciatica, right side: Secondary | ICD-10-CM

## 2020-02-21 NOTE — Therapy (Signed)
[facility] [facility] [address] [place], [place], [address] Phone: [phone]   Fax:  [phone]  Physical Therapy Evaluation  Patient Details  Name: Aaron Mendez MRN: [id number] Date of Birth: December 04, 1945 Referring Provider (PT): Everrett Coombe   Encounter Date: 02/21/2020   PT End of Session - 02/21/20 1433    Visit Number 1    Authorization Type [employer], next visit assure the # of visits and the time frame    PT Start Time 1407    PT Stop Time 1455    PT Time Calculation (min) 48 min    Activity Tolerance Patient tolerated treatment well    Behavior During Therapy [facility] for tasks assessed/performed           Past Medical History:  Diagnosis Date  . Bilateral primary osteoarthritis of knee   . Sickle cell trait (HCC)     History reviewed. No pertinent surgical history.  There were no vitals filed for this visit.    Subjective Assessment - 02/21/20 1412    Subjective Patient reports that he had Covid about 3 months ago, he reports that he was in the hospital for 4 days, reports that he has been having LBP and right thigh pain since that time, he is unsure of a specific cause.  X-rays show mild DDD, and arthritis.    Limitations Walking;Standing;House hold activities;Lifting    Patient Stated Goals have less pain    Currently in Pain? Yes    Pain Score 2     Pain Location Back   right thigh   Pain Orientation Right    Pain Descriptors / Indicators Sharp;Shooting    Pain Type Acute pain    Pain Radiating Towards pain in the right anterior/lateral thigh to the knee    Pain Onset More than a month ago    Pain Frequency Intermittent    Aggravating Factors  getting up from sitting, standing, lfiting, pain up to 9/10    Pain Relieving Factors sit, rest, lie down, pain meds, pain can be 0/10    Effect of Pain on Daily Activities really hard to get up and down, walking,, standing              [facility] PT Assessment -  02/21/20 0001      Assessment   Medical Diagnosis LBP, with right thigh pain    Referring Provider (PT) Everrett Coombe    Onset Date/Surgical Date 11/21/19    Prior Therapy no      Precautions   Precautions None      Balance Screen   Has the patient fallen in the past 6 months No    Has the patient had a decrease in activity level because of a fear of falling?  No    Is the patient reluctant to leave their home because of a fear of falling?  No      Home Environment   Additional Comments does some housework      Prior Function   Level of Independence Independent    Vocation Self employed    Vocation Requirements self employed lays brick    Leisure walk and stretch      Posture/Postural Control   Posture Comments slouched posture, fwd head, rounded shoulders      ROM / Strength   AROM / PROM / Strength AROM;Strength      AROM   Overall AROM Comments lumbar ROM decreased 75% for all motions  except 100% limited for extension extension and right side bending incresaed right buttock and LBP      Strength   Overall Strength Comments 5/5 no pain      Flexibility   Soft Tissue Assessment /Muscle Length yes    Hamstrings very tight with pain 50 degrees    ITB very tight    Piriformis very tight      Palpation   Palpation comment very tight in the lumbar and buttock area, non tender in the buttocks, thigh or low back      Transfers   Comments getting up and down from sitting is difficult, he struggles and reports pain i nthe low back and down the right lateral leg                      Objective measurements completed on examination: See above findings.       [facility] Adult PT Treatment/Exercise - 02/21/20 0001      Modalities   Modalities Electrical Stimulation;Moist Heat      Moist Heat Therapy   Number Minutes Moist Heat 12 Minutes    Moist Heat Location Lumbar Spine;Hip      Electrical Stimulation   Electrical Stimulation Location right low back and  into the buttock    Electrical Stimulation Action IFC    Electrical Stimulation Parameters supine    Electrical Stimulation Goals Pain                  PT Education - 02/21/20 1433    Education Details Wms flexion to get him moving some for the arthritis and the DDD    Person(s) Educated Patient    Methods Explanation;Demonstration;Handout    Comprehension Verbalized understanding            PT Short Term Goals - 02/21/20 1438      PT SHORT TERM GOAL #1   Title independent with initial HEP    Time 2    Period Weeks    Status New             PT Long Term Goals - 02/21/20 1439      PT LONG TERM GOAL #1   Title understand posture and body mechanics    Time 8    Period Weeks    Status New      PT LONG TERM GOAL #2   Title decrease pain overall 50%    Time 8    Period Weeks    Status New      PT LONG TERM GOAL #3   Title increase lumbar ROM 50%    Time 8    Period Weeks    Status New      PT LONG TERM GOAL #4   Title get up from sitting witout pain    Time 8    Period Weeks    Status New                  Plan - 02/21/20 1434    Clinical Impression Statement PAtient reports that he has been in good health all of his life, he reports that about 3 months ago he got Covid, he reports a 4 day hospital stay, reports that he hsa had LBP and right latral thigh pain, he repports pain can be up to 9/10, reports with walking, standing and especially getting up from sitting.  X-rays showed DDD and arthritis.  He is very tight in the HS  and the piriformis mms, very limtied lumbar ROM, with pain increased with extension and right side bending    Stability/Clinical Decision Making Evolving/Moderate complexity    Clinical Decision Making Low    Rehab Potential Good    PT Frequency 2x / week    PT Duration 8 weeks    PT Treatment/Interventions ADLs/Self Care Home Management;Cryotherapy;Electrical Stimulation;Moist Heat;Traction;Ultrasound;Stair  training;Functional mobility training;Therapeutic activities;Therapeutic exercise;Manual techniques;Patient/family education;Dry needling    PT Next Visit Plan may try to slowly ease into some extension exercises    Consulted and Agree with Plan of Care Patient           Patient will benefit from skilled therapeutic intervention in order to improve the following deficits and impairments:  Decreased range of motion, Difficulty walking, Increased muscle spasms, Decreased activity tolerance, Pain, Impaired flexibility, Improper body mechanics, Decreased mobility, Decreased strength  Visit Diagnosis: Acute bilateral low back pain with right-sided sciatica - Plan: PT plan of care cert/re-cert  Muscle spasm of back - Plan: PT plan of care cert/re-cert     Problem List Patient Active Problem List   Diagnosis Date Noted  . Weight loss 01/18/2020  . Myalgia 12/28/2019  . Urinary dribbling 12/28/2019  . Urinary incontinence 12/04/2019  . Embolic stroke involving left cerebellar artery ([place]) 11/13/2019  . Type 2 diabetes mellitus without complications ([place]) 17/40/8144  . Acute CVA (cerebrovascular accident) ([place]) 10/29/2019  . Fever due to COVID-19 10/12/2019  . Acute bilateral low back pain without sciatica 05/06/2019  . Poison ivy dermatitis 02/04/2019  . Erectile dysfunction 06/29/2018  . Encounter for well adult exam with abnormal findings 12/21/2017  . Enlarged prostate on rectal examination 12/21/2017  . Unilateral primary osteoarthritis, left knee 12/21/2017    Sumner Boast., PT 02/21/2020, 2:48 PM  [address] [place], [place], [address] Phone: [phone]   Fax:  [phone]  Name: Hideo Googe MRN: [id number] Date of Birth: 28-Jun-1946

## 2020-02-23 ENCOUNTER — Other Ambulatory Visit: Payer: Self-pay

## 2020-02-23 MED ORDER — ROSUVASTATIN CALCIUM 10 MG PO TABS: 10.0000 mg | ORAL_TABLET | Freq: Every day | ORAL | 0 refills | Status: DC

## 2020-02-29 ENCOUNTER — Ambulatory Visit (INDEPENDENT_AMBULATORY_CARE_PROVIDER_SITE_OTHER): Payer: Medicare PPO | Admitting: Family Medicine

## 2020-02-29 ENCOUNTER — Encounter: Payer: Medicare PPO | Admitting: Physical Therapy

## 2020-02-29 ENCOUNTER — Encounter: Payer: Self-pay | Admitting: Family Medicine

## 2020-02-29 ENCOUNTER — Other Ambulatory Visit: Payer: Self-pay

## 2020-02-29 DIAGNOSIS — M545 Low back pain, unspecified: Secondary | ICD-10-CM

## 2020-02-29 NOTE — Patient Instructions (Signed)
Continue Physical therapy.  Tylenol for pain as needed.  May use tramadol occasionally if needed as well.  Let's follow up in about 8 weeks after you have had a few physical therapy sessions.

## 2020-02-29 NOTE — Progress Notes (Signed)
Aaron Mendez - 74 y.o. male MRN [id number]  Date of birth: 16-Aug-1946  Subjective Chief Complaint  Patient presents with  . Follow-up    back pain    HPI Aaron Mendez is a 74 y.o. male here today for follow up of back pain.  He reports that he continues to have low back pain on the R side with radiation into the R leg.  Symptoms started after previous COVID infection.  Prior  xrays show multilevel lumbar facet arthrosis.  He has been referred to PT but has only had one session.  He has used tramadol once since last visit.  He is also taking tylenol as needed which controls this fairly well.  He denies weakness in his lower extremities.   ROS:  A comprehensive ROS was completed and negative except as noted per HPI  No Known Allergies  Past Medical History:  Diagnosis Date  . Bilateral primary osteoarthritis of knee   . Sickle cell trait (HCC)     No past surgical history on file.  Social History   Socioeconomic History  . Marital status: Single    Spouse name: Not on file  . Number of children: Not on file  . Years of education: Not on file  . Highest education level: Not on file  Occupational History  . Not on file  Tobacco Use  . Smoking status: Never Smoker  . Smokeless tobacco: Never Used  Substance and Sexual Activity  . Alcohol use: No  . Drug use: No  . Sexual activity: Yes  Other Topics Concern  . Not on file  Social History Narrative  . Not on file   Social Determinants of Health   Financial Resource Strain:   . Difficulty of Paying Living Expenses:   Food Insecurity: No Food Insecurity  . Worried About Programme researcher, broadcasting/film/video in the Last Year: Never true  . Aaron Out of Food in the Last Year: Never true  Transportation Needs: No Transportation Needs  . Lack of Transportation (Medical): No  . Lack of Transportation (Non-Medical): No  Physical Activity:   . Days of Exercise per Week:   . Minutes of Exercise per Session:   Stress: No Stress Concern Present   . Feeling of Stress : Not at all  Social Connections:   . Frequency of Communication with Friends and Family:   . Frequency of Social Gatherings with Friends and Family:   . Attends Religious Services:   . Active Member of Clubs or Organizations:   . Attends Banker Meetings:   Marland Kitchen Marital Status:     Family History  Problem Relation Age of Onset  . Sickle cell anemia Son     Health Maintenance  Topic Date Due  . OPHTHALMOLOGY EXAM  Never done  . URINE MICROALBUMIN  Never done  . COVID-19 Vaccine (1) 08/08/2020 (Originally 11/05/1956)  . COLONOSCOPY  11/08/2020 (Originally 11/05/1994)  . PNA vac Low Risk Adult (2 of 2 - PPSV23) 11/08/2020 (Originally 12/22/2018)  . FOOT EXAM  11/30/2020 (Originally 11/05/1954)  . TETANUS/TDAP  11/30/2020 (Originally 11/06/1963)  . INFLUENZA VACCINE  04/08/2020  . HEMOGLOBIN A1C  04/27/2020  . Hepatitis C Screening  Completed     ----------------------------------------------------------------------------------------------------------------------------------------------------------------------------------------------------------------- Physical Exam BP 120/64   Pulse 65   Temp 97.8 F (36.6 C) (Oral)   Ht 5\' 8"  (1.727 m)   Wt 179 lb (81.2 kg)   SpO2 97% Comment: on RA  BMI 27.22 kg/m   Physical Exam  Constitutional:      Appearance: Normal appearance.  HENT:     Head: Normocephalic and atraumatic.  Eyes:     General: No scleral icterus. Cardiovascular:     Rate and Rhythm: Normal rate and regular rhythm.  Pulmonary:     Effort: Pulmonary effort is normal.     Breath sounds: Normal breath sounds.  Musculoskeletal:     Cervical back: Neck supple.  Neurological:     General: No focal deficit present.     Mental Status: He is alert.  Psychiatric:        Mood and Affect: Mood normal.        Behavior: Behavior normal.      ------------------------------------------------------------------------------------------------------------------------------------------------------------------------------------------------------------------- Assessment and Plan  Acute bilateral low back pain without sciatica Pain is more prominent along the R side at this time.  He will continue PT with tylenol as needed for pain.  He will return in about 8 weeks for follow up after a few more sessions with PT.     No orders of the defined types were placed in this encounter.   Return in about 8 weeks (around 04/25/2020) for back pain/DM.    This visit occurred during the SARS-CoV-2 public health emergency.  Safety protocols were in place, including screening questions prior to the visit, additional usage of staff PPE, and extensive cleaning of exam room while observing appropriate contact time as indicated for disinfecting solutions.

## 2020-02-29 NOTE — Assessment & Plan Note (Signed)
Pain is more prominent along the R side at this time.  He will continue PT with tylenol as needed for pain.  He will return in about 8 weeks for follow up after a few more sessions with PT.

## 2020-03-02 ENCOUNTER — Other Ambulatory Visit: Payer: Self-pay

## 2020-03-02 ENCOUNTER — Encounter: Payer: Self-pay | Admitting: Physical Therapy

## 2020-03-02 ENCOUNTER — Ambulatory Visit: Payer: Medicare PPO | Admitting: Physical Therapy

## 2020-03-02 DIAGNOSIS — M5441 Lumbago with sciatica, right side: Secondary | ICD-10-CM

## 2020-03-02 DIAGNOSIS — M62830 Muscle spasm of back: Secondary | ICD-10-CM

## 2020-03-02 NOTE — Therapy (Signed)
[facility] [address] [place], [place], [address] Phone: [phone]   Fax:  [phone]  Physical Therapy Treatment  Patient Details  Name: Aaron Mendez MRN: [id number] Date of Birth: [birth year] Referring Provider (PT): Luetta Nutting   Encounter Date: 03/02/2020   PT End of Session - 03/02/20 0812    Visit Number 2    Authorization Type [employer], next visit assure the # of visits and the time frame    PT Start Time 0749    PT Stop Time 0836    PT Time Calculation (min) 47 min    Activity Tolerance Patient tolerated treatment well    Behavior During Therapy [facility] for tasks assessed/performed           Past Medical History:  Diagnosis Date  . Bilateral primary osteoarthritis of knee   . Sickle cell trait ([place])     History reviewed. No pertinent surgical history.  There were no vitals filed for this visit.   Subjective Assessment - 03/02/20 0750    Subjective Patient reports that he is still having back pain, he does report doing the exercises that I gavve him at evaluation    Currently in Pain? Yes    Pain Score 6     Pain Location Back    Pain Orientation Lower    Pain Relieving Factors "that heat felt good"                             [facility] Adult PT Treatment/Exercise - 03/02/20 0001      Exercises   Exercises Lumbar      Lumbar Exercises: Stretches   Passive Hamstring Stretch Right;Left;4 reps;20 seconds    Single Knee to Chest Stretch Right;Left;3 reps;10 seconds    Lower Trunk Rotation 4 reps;10 seconds    Piriformis Stretch Right;Left;4 reps;20 seconds      Lumbar Exercises: Aerobic   Nustep level 4 x 107minutes      Lumbar Exercises: Machines for Strengthening   Cybex Knee Extension 5# 2x10    Cybex Knee Flexion 25# 2x10    Other Lumbar Machine Exercise 20# seated rows and lats 2x10      Lumbar Exercises: Supine   Other Supine Lumbar Exercises feet on ball K2C, trunk  rotation, isometric abs      Modalities   Modalities Electrical Stimulation;Moist Heat      Moist Heat Therapy   Number Minutes Moist Heat 12 Minutes    Moist Heat Location Lumbar Spine;Hip      Electrical Stimulation   Electrical Stimulation Location right low back and into the buttock    Electrical Stimulation Action IFC    Electrical Stimulation Parameters supine    Electrical Stimulation Goals Pain                    PT Short Term Goals - 03/02/20 1540      PT SHORT TERM GOAL #1   Title independent with initial HEP    Status Achieved             PT Long Term Goals - 02/21/20 1439      PT LONG TERM GOAL #1   Title understand posture and body mechanics    Time 8    Period Weeks    Status New      PT LONG TERM GOAL #2   Title decrease pain overall 50%  Time 8    Period Weeks    Status New      PT LONG TERM GOAL #3   Title increase lumbar ROM 50%    Time 8    Period Weeks    Status New      PT LONG TERM GOAL #4   Title get up from sitting witout pain    Time 8    Period Weeks    Status New                 Plan - 03/02/20 0813    Clinical Impression Statement Patient is very stiff in the trunk, the shoulders and hips.  Needs a lot of cues to do the exercises correctly as he tends to use his body and go fast.  The right leg and hip are tighter and painful    PT Next Visit Plan continue to work on trunk motion and decrease his pain    Consulted and Agree with Plan of Care Patient           Patient will benefit from skilled therapeutic intervention in order to improve the following deficits and impairments:  Decreased range of motion, Difficulty walking, Increased muscle spasms, Decreased activity tolerance, Pain, Impaired flexibility, Improper body mechanics, Decreased mobility, Decreased strength  Visit Diagnosis: Acute bilateral low back pain with right-sided sciatica  Muscle spasm of back     Problem List Patient Active  Problem List   Diagnosis Date Noted  . Weight loss 01/18/2020  . Myalgia 12/28/2019  . Urinary dribbling 12/28/2019  . Urinary incontinence 12/04/2019  . 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 Type 2 diabetes mellitus without complications (HCC) 11/09/2019  . Acute CVA (cerebrovascular accident) (HCC) 10/29/2019  . Fever due to COVID-19 10/12/2019  . Acute bilateral low back pain without sciatica 05/06/2019  . Poison ivy dermatitis 02/04/2019  . Erectile dysfunction 06/29/2018  . Encounter for well adult exam with abnormal findings 12/21/2017  . Enlarged prostate on rectal examination 12/21/2017  . Unilateral primary osteoarthritis, left knee 12/21/2017    Trenell Moxey W.,PT 03/02/2020, 8:23 AM  [facility] [address] [place], [place], [address] Phone: [phone]   Fax:  [phone]  Name: Aaron Mendez MRN: [id number] Date of Birth: March 28, 1946

## 2020-03-06 ENCOUNTER — Ambulatory Visit: Payer: Medicare PPO | Admitting: Physical Therapy

## 2020-03-08 ENCOUNTER — Ambulatory Visit: Payer: Medicare PPO | Attending: Family Medicine | Admitting: Physical Therapy

## 2020-03-08 ENCOUNTER — Other Ambulatory Visit: Payer: Self-pay

## 2020-03-08 ENCOUNTER — Encounter: Payer: Self-pay | Admitting: Physical Therapy

## 2020-03-08 DIAGNOSIS — M62830 Muscle spasm of back: Secondary | ICD-10-CM | POA: Diagnosis not present

## 2020-03-08 DIAGNOSIS — M5441 Lumbago with sciatica, right side: Secondary | ICD-10-CM

## 2020-03-08 NOTE — Therapy (Signed)
[facility] [place] [facility] [address] [place], [place], [address] Phone: [phone]   Fax:  [phone]  Physical Therapy Treatment  Patient Details  Name: Aaron Mendez MRN: [id number] Date of Birth: [birth year] Referring Provider (PT): Everrett Coombe   Encounter Date: 03/08/2020   PT End of Session - 03/08/20 1646    Visit Number 3    Authorization Type [employer], next visit assure the # of visits and the time frame    PT Start Time 1604    PT Stop Time 1654    PT Time Calculation (min) 50 min    Activity Tolerance Patient tolerated treatment well    Behavior During Therapy [facility] for tasks assessed/performed           Past Medical History:  Diagnosis Date  . Bilateral primary osteoarthritis of knee   . Sickle cell trait (HCC)     History reviewed. No pertinent surgical history.  There were no vitals filed for this visit.   Subjective Assessment - 03/08/20 1604    Subjective Pain from R hip to lateral knee    Currently in Pain? Yes    Pain Score 5     Pain Location Hip   to knee   Pain Orientation Right                             [facility] Adult PT Treatment/Exercise - 03/08/20 0001      Lumbar Exercises: Stretches   Passive Hamstring Stretch Right;4 reps;20 seconds    Single Knee to Chest Stretch Right;3 reps;10 seconds    Piriformis Stretch Right;4 reps;20 seconds      Lumbar Exercises: Aerobic   Recumbent Bike x3 min     Nustep level 4 x      Lumbar Exercises: Machines for Strengthening   Cybex Knee Extension 5# 2x10    Cybex Knee Flexion 25# 2x10    Other Lumbar Machine Exercise 25# seated rows and lats 2x10      Modalities   Modalities Electrical Stimulation;Moist Heat      Moist Heat Therapy   Number Minutes Moist Heat 10 Minutes    Moist Heat Location --   R hamstring     Electrical Stimulation   Electrical Stimulation Location R hamstring    Electrical Stimulation Action IFC     Electrical Stimulation Parameters supine    Electrical Stimulation Goals Pain                    PT Short Term Goals - 03/02/20 1517      PT SHORT TERM GOAL #1   Title independent with initial HEP    Status Achieved             PT Long Term Goals - 02/21/20 1439      PT LONG TERM GOAL #1   Title understand posture and body mechanics    Time 8    Period Weeks    Status New      PT LONG TERM GOAL #2   Title decrease pain overall 50%    Time 8    Period Weeks    Status New      PT LONG TERM GOAL #3   Title increase lumbar ROM 50%    Time 8    Period Weeks    Status New      PT LONG TERM GOAL #4  Title get up from sitting witout pain    Time 8    Period Weeks    Status New                 Plan - 03/08/20 1650    Clinical Impression Statement Pt very stiff in the hip today, ambulates with a forward flexed posture. He stated that his back pain has improved but has had some R hip pain down to his knee. Postural cues needed with seated rows to keep from swaying. Cue needed to complete full ROM with curls and extensions.    Stability/Clinical Decision Making Evolving/Moderate complexity    Rehab Potential Good    PT Duration 8 weeks    PT Treatment/Interventions ADLs/Self Care Home Management;Cryotherapy;Electrical Stimulation;Moist Heat;Traction;Ultrasound;Stair training;Functional mobility training;Therapeutic activities;Therapeutic exercise;Manual techniques;Patient/family education;Dry needling    PT Next Visit Plan continue to work on trunk motion and decrease his pain           Patient will benefit from skilled therapeutic intervention in order to improve the following deficits and impairments:  Decreased range of motion, Difficulty walking, Increased muscle spasms, Decreased activity tolerance, Pain, Impaired flexibility, Improper body mechanics, Decreased mobility, Decreased strength  Visit Diagnosis: Acute bilateral low back pain with  right-sided sciatica  Muscle spasm of back     Problem List Patient Active Problem List   Diagnosis Date Noted  . Weight loss 01/18/2020  . Myalgia 12/28/2019  . Urinary dribbling 12/28/2019  . Urinary incontinence 12/04/2019  . 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 Type 2 diabetes mellitus without complications (HCC) 11/09/2019  . Acute CVA (cerebrovascular accident) (HCC) 10/29/2019  . Fever due to COVID-19 10/12/2019  . Acute bilateral low back pain without sciatica 05/06/2019  . Poison ivy dermatitis 02/04/2019  . Erectile dysfunction 06/29/2018  . Encounter for well adult exam with abnormal findings 12/21/2017  . Enlarged prostate on rectal examination 12/21/2017  . Unilateral primary osteoarthritis, left knee 12/21/2017    Grayce Sessions, PTA 03/08/2020, 4:53 PM  [facility] [place] [facility] [address] [place], [place], [address] Phone: [phone]   Fax:  [phone]  Name: Aaron Mendez MRN: [id number] Date of Birth: 1946/04/03

## 2020-03-29 ENCOUNTER — Other Ambulatory Visit: Payer: Self-pay

## 2020-03-29 NOTE — Patient Outreach (Signed)
[facility]) Care Management  03/29/2020  Braedan Meuth [birth year] [phone]   Telephone call to patient for disease management follow up.  No answer.  HIPAA compliant voice message left.    Plan: RN CM will attempt patient again in the month of October.    [name], RN, MSN [facility] Care Management Care Management Coordinator Direct Line [phone] Cell [phone] Toll Free: [phone]  Fax: [phone]

## 2020-04-10 ENCOUNTER — Other Ambulatory Visit: Payer: Self-pay

## 2020-04-10 ENCOUNTER — Ambulatory Visit: Payer: Medicare PPO | Admitting: Orthopaedic Surgery

## 2020-04-10 ENCOUNTER — Encounter: Payer: Self-pay | Admitting: Orthopaedic Surgery

## 2020-04-10 VITALS — Ht 69.69 in | Wt 178.0 lb

## 2020-04-10 DIAGNOSIS — M5441 Lumbago with sciatica, right side: Secondary | ICD-10-CM | POA: Diagnosis not present

## 2020-04-10 DIAGNOSIS — G8929 Other chronic pain: Secondary | ICD-10-CM

## 2020-04-10 MED ORDER — METHOCARBAMOL 500 MG PO TABS: 500.0000 mg | ORAL_TABLET | Freq: Two times a day (BID) | ORAL | 0 refills | Status: DC | PRN

## 2020-04-10 MED ORDER — METHYLPREDNISOLONE 4 MG PO TBPK: ORAL_TABLET | ORAL | 0 refills | Status: DC

## 2020-04-10 NOTE — Progress Notes (Signed)
Office Visit Note   Patient: Aaron Mendez           Date of Birth: 09-30-1945           MRN: [id number] Visit Date: 04/10/2020              Requested by: Everrett Coombe, DO [address]  [address] [place],  [place] [address] PCP: Everrett Coombe, DO   Assessment & Plan: Visit Diagnoses:  1. Chronic right-sided low back pain with right-sided sciatica     Plan: Impression is chronic right sided lower back pain with right lower extremity radiculopathy.  The patient has tried muscle relaxers, steroid tapers and physical therapy in the past without long-lasting relief.  At this point, we have suggested an MRI of the lumbar spine to further assess for structural abnormalities.  He will follow up with [place] once has been completed.  In the meantime, have called in a prednisone taper and muscle relaxer to take as needed.  Follow-Up Instructions: Return for after MRI.   Orders:  No orders of the defined types were placed in this encounter.  Meds ordered this encounter  Medications  . methocarbamol (ROBAXIN) 500 MG tablet    Sig: Take 1 tablet (500 mg total) by mouth 2 (two) times daily as needed.    Dispense:  20 tablet    Refill:  0  . methylPREDNISolone (MEDROL DOSEPAK) 4 MG TBPK tablet    Sig: Take as directed    Dispense:  21 tablet    Refill:  0      Procedures: No procedures performed   Clinical Data: No additional findings.   Subjective: Chief Complaint  Patient presents with  . Lower Back - Pain    HPI patient is a pleasant 74 year old gentleman who comes in today with recurrent right lower back pain and right lower extremity radiculopathy.  He has had intermittent low back pain for the past few years.  He denies any specific injury.  The pain he has now having radiates to the right buttocks and down the lateral thigh to the knee.  He describes this as a constant pain worse with standing for long period of time.  He denies any numbness, tingling or burning.   No bowel or bladder change and no saddle paresthesias.  No significant weakness.  He has previously tried Robaxin and steroids as well as formal physical therapy without long-lasting relief of symptoms.  He has not had a epidural steroid injection in the past.  Review of Systems as detailed in HPI.  All others reviewed and are negative.   Objective: Vital Signs: Ht 5' 9.69" (1.77 m)   Wt 178 lb (80.7 kg)   BMI 25.77 kg/m   Physical Exam well-developed well-nourished gentleman in no acute distress.  Alert and oriented x3.  Ortho Exam examination of his lumbar spine reveals no spinous tenderness.  He does have moderate paraspinous tenderness on the right and a positive straight leg raise.  Negative logroll.  No pain with lumbar flexion or extension.  No focal weakness.  He is neurovascular intact distally.  Specialty Comments:  No specialty comments available.  Imaging: No new imaging   PMFS History: Patient Active Problem List   Diagnosis Date Noted  . Weight loss 01/18/2020  . Myalgia 12/28/2019  . Urinary dribbling 12/28/2019  . Urinary incontinence 12/04/2019  . 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 Type 2 diabetes mellitus without complications (HCC) 11/09/2019  .  Acute CVA (cerebrovascular accident) (HCC) 10/29/2019  . Fever due to COVID-19 10/12/2019  . Acute bilateral low back pain without sciatica 05/06/2019  . Poison ivy dermatitis 02/04/2019  . Erectile dysfunction 06/29/2018  . Encounter for well adult exam with abnormal findings 12/21/2017  . Enlarged prostate on rectal examination 12/21/2017  . Unilateral primary osteoarthritis, left knee 12/21/2017   Past Medical History:  Diagnosis Date  . Bilateral primary osteoarthritis of knee   . Sickle cell trait (HCC)     Family History  Problem Relation Age of Onset  . Sickle cell anemia Son     History reviewed. No pertinent surgical history. Social History   Occupational History    . Not on file  Tobacco Use  . Smoking status: Never Smoker  . Smokeless tobacco: Never Used  Substance and Sexual Activity  . Alcohol use: No  . Drug use: No  . Sexual activity: Yes

## 2020-04-25 ENCOUNTER — Ambulatory Visit: Payer: Medicare PPO | Admitting: Family Medicine

## 2020-04-29 ENCOUNTER — Other Ambulatory Visit: Payer: Self-pay | Admitting: Physician Assistant

## 2020-05-01 ENCOUNTER — Other Ambulatory Visit: Payer: Self-pay | Admitting: Family Medicine

## 2020-05-01 ENCOUNTER — Other Ambulatory Visit: Payer: Self-pay

## 2020-05-01 MED ORDER — METFORMIN HCL 500 MG PO TABS: 500.0000 mg | ORAL_TABLET | Freq: Every day | ORAL | 0 refills | Status: DC

## 2020-05-04 ENCOUNTER — Other Ambulatory Visit: Payer: Self-pay

## 2020-05-04 MED ORDER — CLOPIDOGREL BISULFATE 75 MG PO TABS: 75.0000 mg | ORAL_TABLET | Freq: Every day | ORAL | 1 refills | Status: DC

## 2020-05-08 ENCOUNTER — Inpatient Hospital Stay: Admission: RE | Admit: 2020-05-08 | Payer: Medicare PPO | Source: Ambulatory Visit

## 2020-05-09 DIAGNOSIS — I10 Essential (primary) hypertension: Secondary | ICD-10-CM | POA: Diagnosis not present

## 2020-05-09 DIAGNOSIS — E1165 Type 2 diabetes mellitus with hyperglycemia: Secondary | ICD-10-CM | POA: Diagnosis not present

## 2020-05-09 DIAGNOSIS — E782 Mixed hyperlipidemia: Secondary | ICD-10-CM | POA: Diagnosis not present

## 2020-05-10 ENCOUNTER — Ambulatory Visit: Payer: Medicare PPO | Admitting: Orthopaedic Surgery

## 2020-05-13 ENCOUNTER — Ambulatory Visit
Admission: RE | Admit: 2020-05-13 | Discharge: 2020-05-13 | Disposition: A | Discharge status: Home / Self Care | Payer: Medicare PPO | Source: Ambulatory Visit | Attending: Orthopaedic Surgery | Admitting: Orthopaedic Surgery

## 2020-05-13 ENCOUNTER — Other Ambulatory Visit: Payer: Self-pay

## 2020-05-13 DIAGNOSIS — M48061 Spinal stenosis, lumbar region without neurogenic claudication: Secondary | ICD-10-CM | POA: Diagnosis not present

## 2020-05-13 DIAGNOSIS — M5441 Lumbago with sciatica, right side: Secondary | ICD-10-CM

## 2020-05-13 IMAGING — MR MR LUMBAR SPINE W/O CM
4 of 5 series · 26 of 48 positions shown · non-contrast
Comparison: Lumbar spine radiographs [DATE]

CLINICAL DATA: Low back pain. Bilateral leg pain, numbness, and
weakness. Symptoms following [Q3] infection in [REDACTED].

EXAM:
MRI LUMBAR SPINE WITHOUT CONTRAST
TECHNIQUE: Multiplanar, multisequence MR imaging of the lumbar spine was
performed. No intravenous contrast was administered.

[Series 2: T2 · sagittal · 4.0mm · 1.09mm/px · 6 of 16 slices shown (1 of 2)]
[im 1/16]
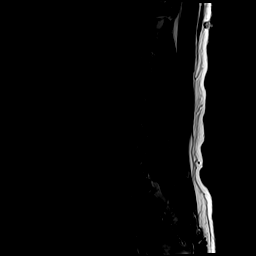
[im 4/16]
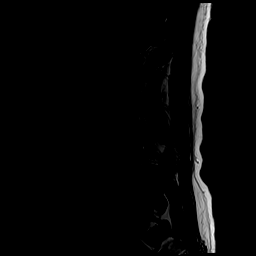
[im 7/16]
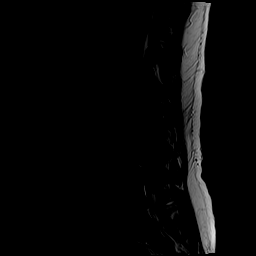
[im 10/16]
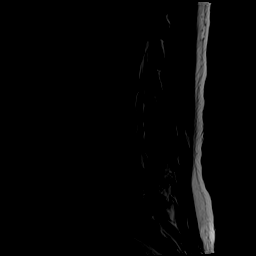
[im 13/16]
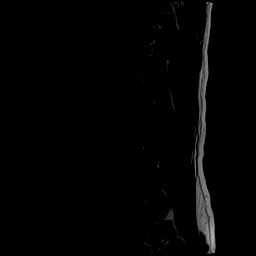
[im 16/16]
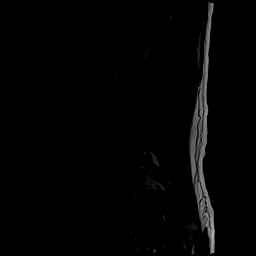

[Series 4: T1 · sagittal · 4.0mm · 1.09mm/px · 6 of 16 slices shown (1 of 2)]
[im 1/16]
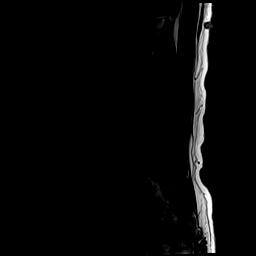
[im 4/16]
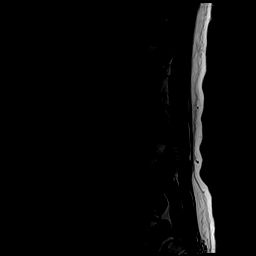
[im 7/16]
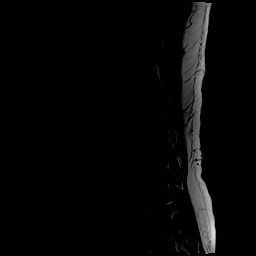
[im 10/16]
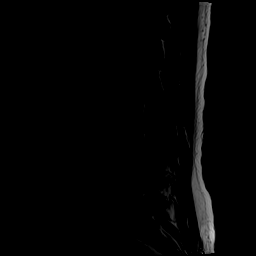
[im 13/16]
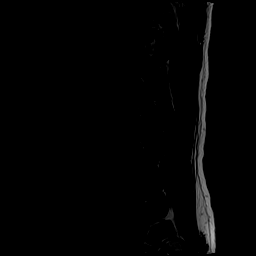
[im 16/16]
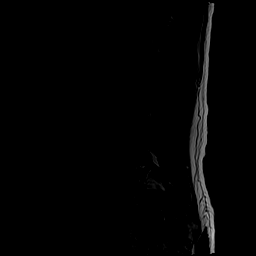

[Series 5: T2 · axial · 4.0mm · 0.39mm/px · z∈[-167,+37]mm · 9 of 40 slices shown (2 of 2)]
[im 1/40]
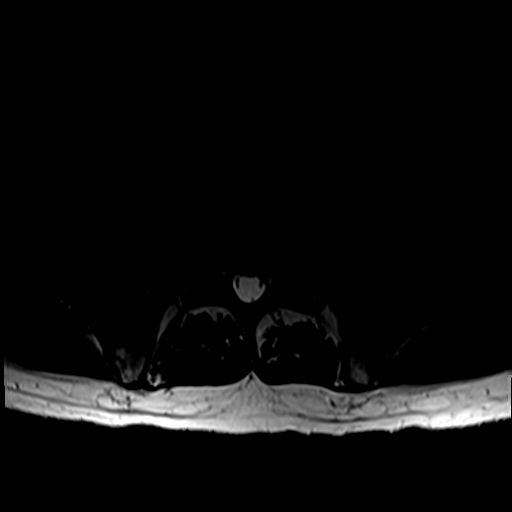
[im 6/40]
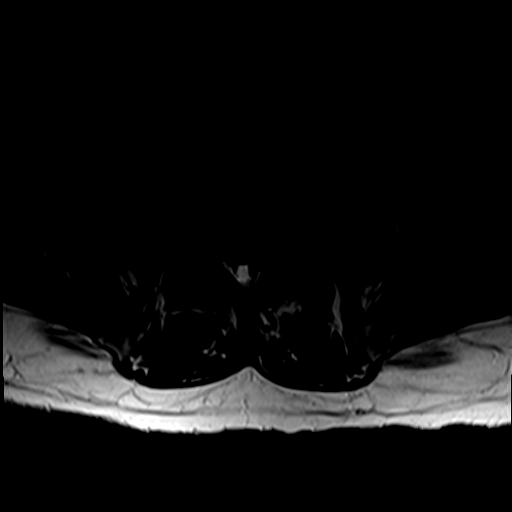
[im 12/40]
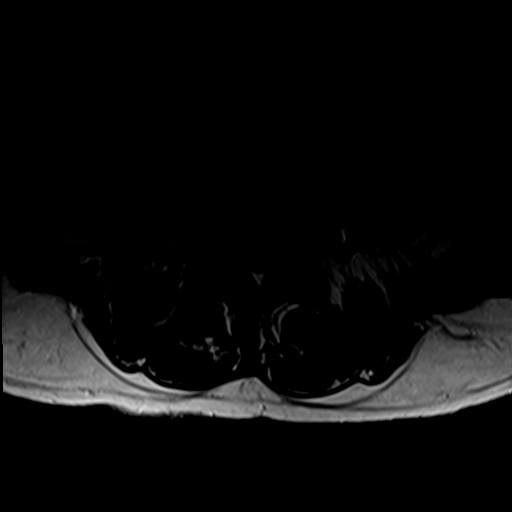
[im 17/40]
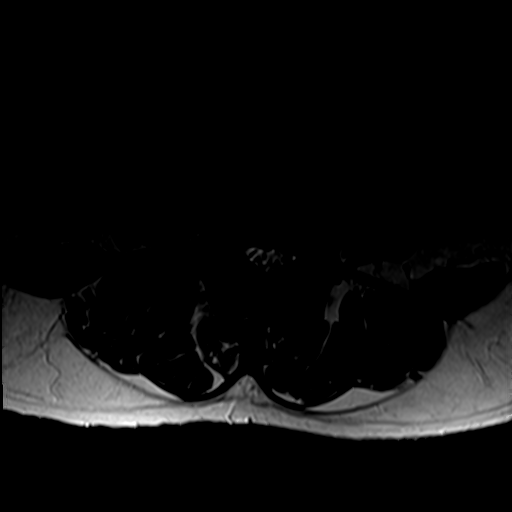
[im 20/40]
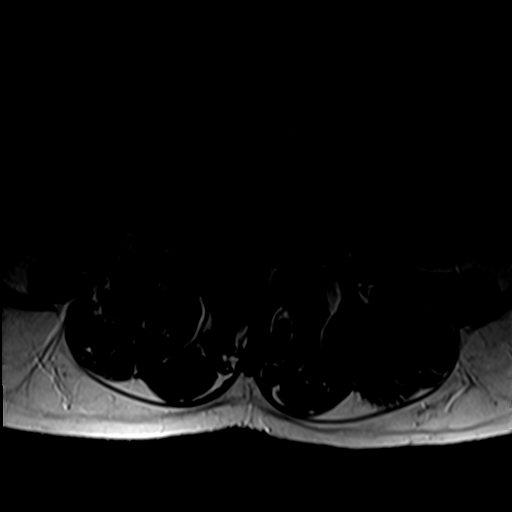
[im 23/40]
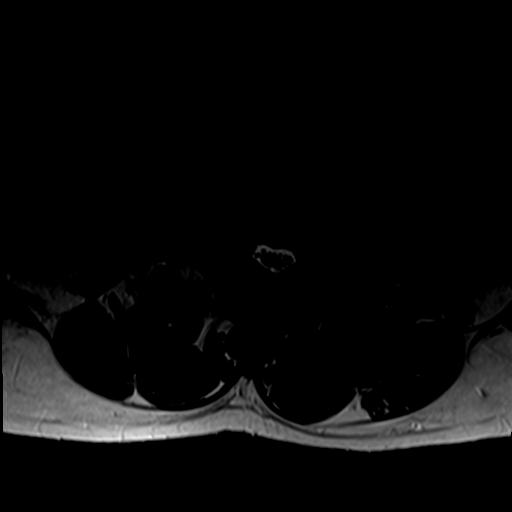
[im 28/40]
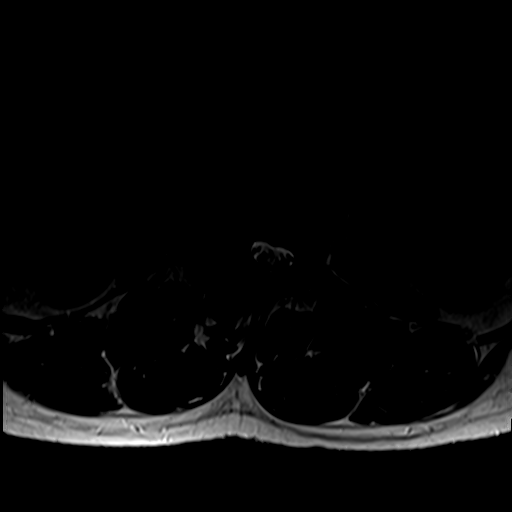
[im 34/40]
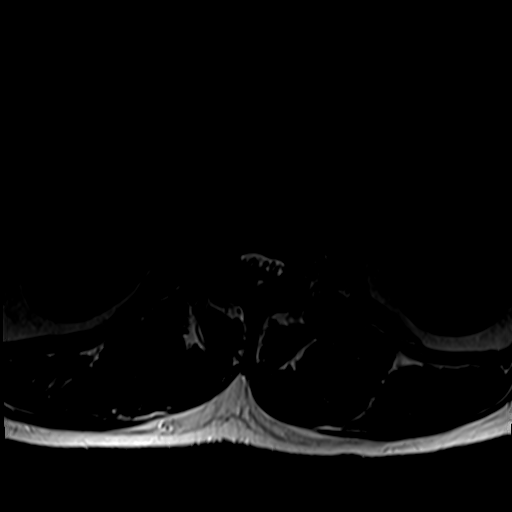
[im 40/40]
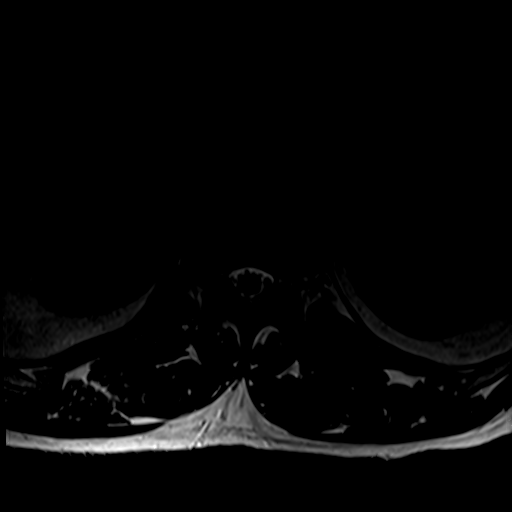

[Series 6: T1 · axial · 4.0mm · 0.39mm/px · z∈[-167,+8]mm · 5 of 40 slices shown (2 of 2)]
[im 1/40]
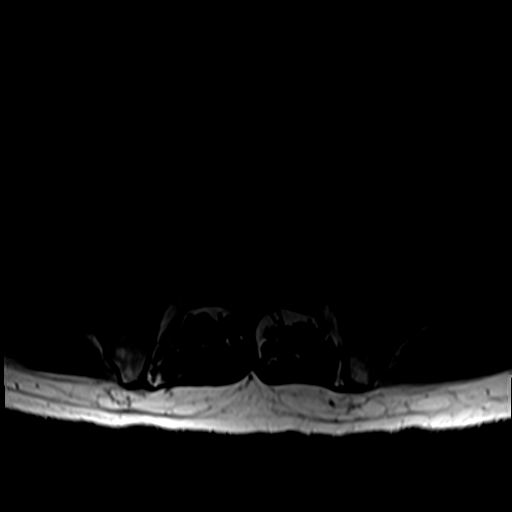
[im 6/40]
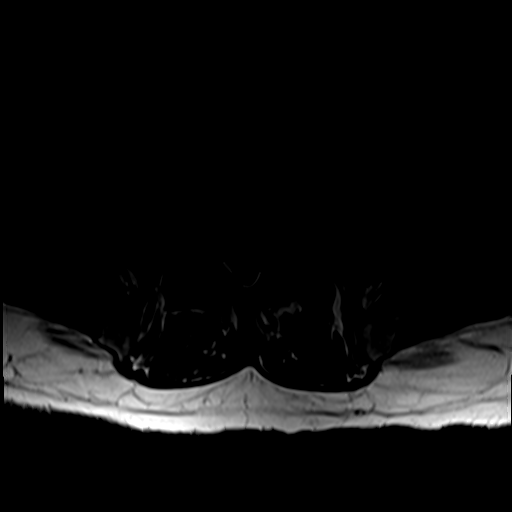
[im 12/40]
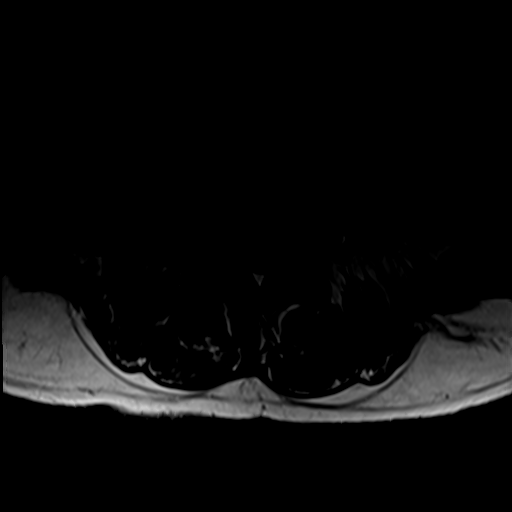
[im 20/40]
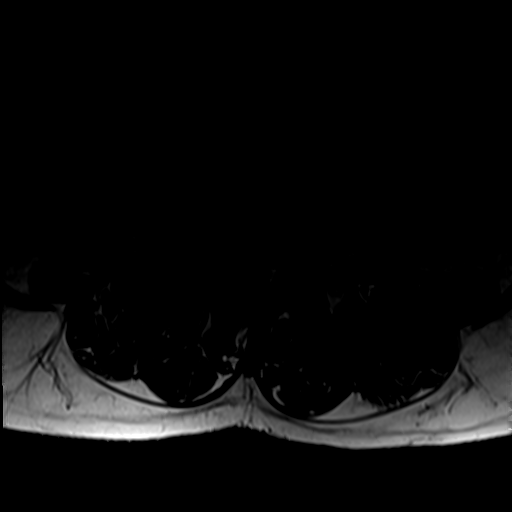
[im 34/40]
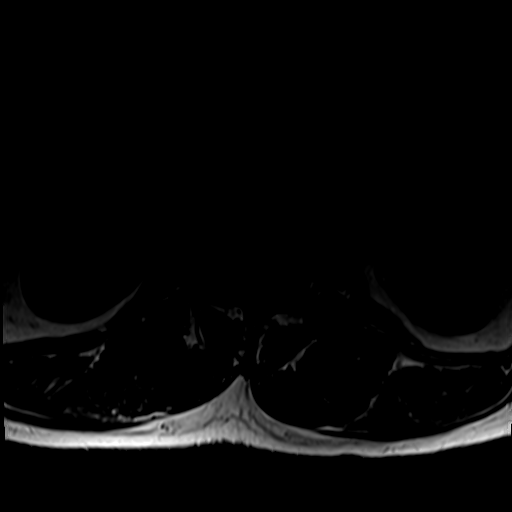

[26 of 48 positions shown; findings below may reference images not displayed]

FINDINGS: Segmentation:  Standard.

Alignment: Mild lumbar levoscoliosis. Trace anterolisthesis of L2 on
L3 and L3 on L4 and trace retrolisthesis of L5 on S1.

Vertebrae: No fracture, suspicious osseous lesion, or evidence of
discitis. Low level right facet edema at L3-4. Multilevel chronic
degenerative endplate changes, most notable at L5-S1. Small
Schmorl's nodes at T11-12 and L2-3.

Conus medullaris and cauda equina: Conus extends to the L1-2 level.
Conus and cauda equina appear normal.

Paraspinal and other soft tissues: Unremarkable.

Disc levels:

Disc desiccation throughout the lumbar spine with moderate to severe
disc space narrowing at L5-S1 and mild narrowing elsewhere. Diffuse
congenital narrowing of the lumbar spinal canal due to short
pedicles.

T12-L1: Mild-to-moderate facet and ligamentum flavum hypertrophy
without disc herniation or stenosis.

L1-2: Disc bulging, a small central disc protrusion, and moderate
facet and ligamentum flavum hypertrophy result in moderate spinal
stenosis, mild-to-moderate bilateral lateral recess stenosis, and
mild right neural foraminal stenosis.

L2-3: Disc bulging and severe facet and ligamentum flavum
hypertrophy result in severe spinal stenosis and moderate right
neural foraminal stenosis.

L3-4: Disc bulging and severe facet and ligamentum flavum
hypertrophy result in severe spinal stenosis, moderate to severe
bilateral lateral recess stenosis, and moderate right neural
foraminal stenosis.

L4-5: Disc bulging and severe facet and ligamentum flavum
hypertrophy result in severe spinal stenosis, severe bilateral
lateral recess stenosis, and moderate right and severe left neural
foraminal stenosis.

L5-S1: Disc bulging and moderate facet and ligamentum flavum
hypertrophy result in mild spinal stenosis, moderate left greater
than right lateral recess stenosis, and moderate right and severe
left neural foraminal stenosis.
IMPRESSION: 1. Congenitally short pedicles with diffuse lumbar disc and facet
degeneration resulting in severe spinal stenosis at L2-3, L3-4, and
L4-5.
2. Moderate to severe multilevel neural foraminal stenosis.

## 2020-05-14 NOTE — Progress Notes (Signed)
Needs f/u appt 

## 2020-05-16 ENCOUNTER — Other Ambulatory Visit: Payer: Self-pay | Admitting: Orthopaedic Surgery

## 2020-05-22 ENCOUNTER — Ambulatory Visit: Payer: Medicare PPO | Admitting: Orthopaedic Surgery

## 2020-05-22 ENCOUNTER — Encounter: Payer: Self-pay | Admitting: Orthopaedic Surgery

## 2020-05-22 DIAGNOSIS — M5416 Radiculopathy, lumbar region: Secondary | ICD-10-CM

## 2020-05-22 NOTE — Progress Notes (Signed)
   Office Visit Note   Patient: Aaron Mendez           Date of Birth: 1946-05-14           MRN: [id number] Visit Date: 05/22/2020              Requested by: No referring provider defined for this encounter. PCP: Patient, No Pcp Per   Assessment & Plan: Visit Diagnoses:  1. Radiculopathy, lumbar region     Plan: Impression is severe spinal stenosis L2-3, L3-4, and L4-5 as well as moderate to severe multilevel neuroforaminal stenosis.  At this point, we have discussed epidural steroid injection versus referral to neurosurgery.  Patient would like to possibly try epidural steroid injection.  We will make a referral.  Follow-Up Instructions: Return if symptoms worsen or fail to improve.   Orders:  Orders Placed This Encounter  Procedures  . Ambulatory referral to Physical Medicine Rehab   No orders of the defined types were placed in this encounter.     Procedures: No procedures performed   Clinical Data: No additional findings.   Subjective: Chief Complaint  Patient presents with  . Lower Back - Pain    HPI patient is a pleasant 74 year old gentleman who comes in today to discuss MRI results of the lumbar spine.  History of right-sided lower back pain and right lower extremity radiculopathy.  He has tried multiple steroids, muscle relaxers and physical therapy all without long-lasting relief.  MRI was thus ordered which showed congenitally short pedicles with diffuse lumbar disc and facet degeneration resulting in severe spinal stenosis L2-3, L3-4 and L4-5.  This also showed moderate to severe multilevel neural foraminal stenosis.  He has not previously undergone epidural steroid injection.     Objective: Vital Signs: There were no vitals taken for this visit.    Ortho Exam stable lumbar exam  Specialty Comments:  No specialty comments available.  Imaging: No new imaging   PMFS History: Patient Active Problem List   Diagnosis Date Noted  . Weight loss  01/18/2020  . Myalgia 12/28/2019  . Urinary dribbling 12/28/2019  . Urinary incontinence 12/04/2019  . 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 Type 2 diabetes mellitus without complications (HCC) 11/09/2019  . Acute CVA (cerebrovascular accident) (HCC) 10/29/2019  . Fever due to COVID-19 10/12/2019  . Acute bilateral low back pain without sciatica 05/06/2019  . Poison ivy dermatitis 02/04/2019  . Erectile dysfunction 06/29/2018  . Encounter for well adult exam with abnormal findings 12/21/2017  . Enlarged prostate on rectal examination 12/21/2017  . Unilateral primary osteoarthritis, left knee 12/21/2017   Past Medical History:  Diagnosis Date  . Bilateral primary osteoarthritis of knee   . Sickle cell trait (HCC)     Family History  Problem Relation Age of Onset  . Sickle cell anemia Son     History reviewed. No pertinent surgical history. Social History   Occupational History  . Not on file  Tobacco Use  . Smoking status: Never Smoker  . Smokeless tobacco: Never Used  Substance and Sexual Activity  . Alcohol use: No  . Drug use: No  . Sexual activity: Yes

## 2020-05-26 ENCOUNTER — Other Ambulatory Visit: Payer: Self-pay | Admitting: Family Medicine

## 2020-05-28 ENCOUNTER — Other Ambulatory Visit: Payer: Self-pay

## 2020-05-28 DIAGNOSIS — M5416 Radiculopathy, lumbar region: Secondary | ICD-10-CM

## 2020-05-28 DIAGNOSIS — G8929 Other chronic pain: Secondary | ICD-10-CM

## 2020-06-05 DIAGNOSIS — E1165 Type 2 diabetes mellitus with hyperglycemia: Secondary | ICD-10-CM | POA: Diagnosis not present

## 2020-06-05 DIAGNOSIS — E782 Mixed hyperlipidemia: Secondary | ICD-10-CM | POA: Diagnosis not present

## 2020-06-05 DIAGNOSIS — I10 Essential (primary) hypertension: Secondary | ICD-10-CM | POA: Diagnosis not present

## 2020-06-05 DIAGNOSIS — E1169 Type 2 diabetes mellitus with other specified complication: Secondary | ICD-10-CM | POA: Diagnosis not present

## 2020-06-13 ENCOUNTER — Other Ambulatory Visit: Payer: Self-pay

## 2020-06-13 ENCOUNTER — Ambulatory Visit (INDEPENDENT_AMBULATORY_CARE_PROVIDER_SITE_OTHER): Payer: Medicare PPO | Admitting: Physical Medicine and Rehabilitation

## 2020-06-13 ENCOUNTER — Ambulatory Visit: Payer: Self-pay

## 2020-06-13 VITALS — BP 131/73 | HR 57

## 2020-06-13 DIAGNOSIS — M5416 Radiculopathy, lumbar region: Secondary | ICD-10-CM | POA: Diagnosis not present

## 2020-06-13 MED ORDER — METHYLPREDNISOLONE ACETATE 80 MG/ML IJ SUSP
80.0000 mg | Freq: Once | INTRAMUSCULAR | Status: AC
  Administered 2020-06-13: 80 mg

## 2020-06-13 NOTE — Progress Notes (Signed)
Pt state lower back pain that travels down his right leg. Pt state the pain can travel down his left leg. Pt state walking and get up out of a chair it that first step that makes his leg weak. Pt state get out of the bed in the morning is painful. Pt state pain cream to help ease the pain.  Numeric Pain Rating Scale and Functional Assessment Average Pain 7   In the last MONTH (on 0-10 scale) has pain interfered with the following?  1. General activity like being  able to carry out your everyday physical activities such as walking, climbing stairs, carrying groceries, or moving a chair?  Rating(8)   +Driver, -BT, -Dye Allergies.

## 2020-06-13 NOTE — Patient Outreach (Signed)
[facility]) Care Management  06/13/2020  Aaron Mendez [birth year] [id number]   Telephone call to patient for disease management follow up.   No answer.  HIPAA compliant voice message left.    Plan: If no return call, RN CM will attempt patient again in January.  [name], RN, MSN [facility] Care Management Care Management Coordinator Direct Line [phone] Cell [phone] Toll Free: [phone]  Fax: [phone]

## 2020-06-21 ENCOUNTER — Ambulatory Visit: Payer: Self-pay

## 2020-07-06 DIAGNOSIS — E1169 Type 2 diabetes mellitus with other specified complication: Secondary | ICD-10-CM | POA: Diagnosis not present

## 2020-07-06 DIAGNOSIS — E119 Type 2 diabetes mellitus without complications: Secondary | ICD-10-CM | POA: Diagnosis not present

## 2020-07-06 DIAGNOSIS — M1991 Primary osteoarthritis, unspecified site: Secondary | ICD-10-CM | POA: Diagnosis not present

## 2020-07-06 DIAGNOSIS — E782 Mixed hyperlipidemia: Secondary | ICD-10-CM | POA: Diagnosis not present

## 2020-07-06 DIAGNOSIS — R739 Hyperglycemia, unspecified: Secondary | ICD-10-CM | POA: Diagnosis not present

## 2020-07-06 DIAGNOSIS — M1909 Primary osteoarthritis, other specified site: Secondary | ICD-10-CM | POA: Diagnosis not present

## 2020-07-15 NOTE — Procedures (Signed)
Lumbosacral Transforaminal Epidural Steroid Injection - Sub-Pedicular Approach with Fluoroscopic Guidance  Patient: Aaron Mendez      Date of Birth: 06-12-1946 MRN: [id number] PCP: Patient, No Pcp Per      Visit Date: 06/13/2020   Universal Protocol:    Date/Time: 06/13/2020  Consent Given By: the patient  Position: PRONE  Additional Comments: Vital signs were monitored before and after the procedure. Patient was prepped and draped in the usual sterile fashion. The correct patient, procedure, and site was verified.   Injection Procedure Details:   Procedure diagnoses:  1. Lumbar radiculopathy      Meds Administered:  Meds ordered this encounter  Medications  . methylPREDNISolone acetate (DEPO-MEDROL) injection 80 mg    Laterality: Bilateral  Location/Site:  L4-L5  Needle size: 22 G  Needle type: Spinal  Needle Placement: Transforaminal  Findings:    -Comments: Excellent flow of contrast along the nerve, nerve root and into the epidural space.  Procedure Details: After squaring off the end-plates to get a true AP view, the C-arm was positioned so that an oblique view of the foramen as noted above was visualized. The target area is just inferior to the "nose of the scotty dog" or sub pedicular. The soft tissues overlying this structure were infiltrated with 2-3 ml. of 1% Lidocaine without Epinephrine.  The spinal needle was inserted toward the target using a "trajectory" view along the fluoroscope beam.  Under AP and lateral visualization, the needle was advanced so it did not puncture dura and was located close the 6 O'Clock position of the pedical in AP tracterory. Biplanar projections were used to confirm position. Aspiration was confirmed to be negative for CSF and/or blood. A 1-2 ml. volume of Isovue-250 was injected and flow of contrast was noted at each level. Radiographs were obtained for documentation purposes.   After attaining the desired flow of contrast  documented above, a 0.5 to 1.0 ml test dose of 0.25% Marcaine was injected into each respective transforaminal space.  The patient was observed for 90 seconds post injection.  After no sensory deficits were reported, and normal lower extremity motor function was noted,   the above injectate was administered so that equal amounts of the injectate were placed at each foramen (level) into the transforaminal epidural space.   Additional Comments:  The patient tolerated the procedure well Dressing: 2 x 2 sterile gauze and Band-Aid    Post-procedure details: Patient was observed during the procedure. Post-procedure instructions were reviewed.  Patient left the clinic in stable condition.

## 2020-07-15 NOTE — Progress Notes (Signed)
Aaron Mendez - 74 y.o. male MRN [id number]  Date of birth: 08-27-1946  Office Visit Note: Visit Date: 06/13/2020 PCP: Patient, No Pcp Per Referred by: Tarry Kos, MD  Subjective: Chief Complaint  Patient presents with  . Lower Back - Pain  . Right Leg - Pain  . Left Leg - Pain   HPI:  Aaron Mendez is a 74 y.o. male who comes in today at the request of Dr. Glee Arvin for planned Bilateral L4-L5 Lumbar epidural steroid injection with fluoroscopic guidance.  The patient has failed conservative care including home exercise, medications, time and activity modification.  This injection will be diagnostic and hopefully therapeutic.  Please see requesting physician notes for further details and justification.  MRI reviewed with images and spine model.  MRI reviewed in the note below.  Patient with multifactorial severe stenosis at L2-3 and L3-4 and L4-5.  Case is complicated by history of stroke currently on Plavix as well as type 2 diabetes.  We had a long discussion about the risk involved.  This type of injection can be done with the patient on anticoagulation.  Depending on relief patient could seek out spine surgery referral although he has pretty risky medications and conditions.  Could consider spinal cord stimulator.    ROS Otherwise per HPI.  Assessment & Plan: Visit Diagnoses:  1. Lumbar radiculopathy     Plan: No additional findings.   Meds & Orders:  Meds ordered this encounter  Medications  . methylPREDNISolone acetate (DEPO-MEDROL) injection 80 mg    Orders Placed This Encounter  Procedures  . XR C-ARM NO REPORT  . Epidural Steroid injection    Follow-up: No follow-ups on file.   Procedures: No procedures performed  Lumbosacral Transforaminal Epidural Steroid Injection - Sub-Pedicular Approach with Fluoroscopic Guidance  Patient: Aaron Mendez      Date of Birth: [birth year] MRN: [id number] PCP: Patient, No Pcp Per      Visit Date: 06/13/2020   Universal  Protocol:    Date/Time: 06/13/2020  Consent Given By: the patient  Position: PRONE  Additional Comments: Vital signs were monitored before and after the procedure. Patient was prepped and draped in the usual sterile fashion. The correct patient, procedure, and site was verified.   Injection Procedure Details:   Procedure diagnoses:  1. Lumbar radiculopathy      Meds Administered:  Meds ordered this encounter  Medications  . methylPREDNISolone acetate (DEPO-MEDROL) injection 80 mg    Laterality: Bilateral  Location/Site:  L4-L5  Needle size: 22 G  Needle type: Spinal  Needle Placement: Transforaminal  Findings:    -Comments: Excellent flow of contrast along the nerve, nerve root and into the epidural space.  Procedure Details: After squaring off the end-plates to get a true AP view, the C-arm was positioned so that an oblique view of the foramen as noted above was visualized. The target area is just inferior to the "nose of the scotty dog" or sub pedicular. The soft tissues overlying this structure were infiltrated with 2-3 ml. of 1% Lidocaine without Epinephrine.  The spinal needle was inserted toward the target using a "trajectory" view along the fluoroscope beam.  Under AP and lateral visualization, the needle was advanced so it did not puncture dura and was located close the 6 O'Clock position of the pedical in AP tracterory. Biplanar projections were used to confirm position. Aspiration was confirmed to be negative for CSF and/or blood. A 1-2 ml. volume of Isovue-250 was injected and flow  of contrast was noted at each level. Radiographs were obtained for documentation purposes.   After attaining the desired flow of contrast documented above, a 0.5 to 1.0 ml test dose of 0.25% Marcaine was injected into each respective transforaminal space.  The patient was observed for 90 seconds post injection.  After no sensory deficits were reported, and normal lower extremity  motor function was noted,   the above injectate was administered so that equal amounts of the injectate were placed at each foramen (level) into the transforaminal epidural space.   Additional Comments:  The patient tolerated the procedure well Dressing: 2 x 2 sterile gauze and Band-Aid    Post-procedure details: Patient was observed during the procedure. Post-procedure instructions were reviewed.  Patient left the clinic in stable condition.      Clinical History: MRI LUMBAR SPINE WITHOUT CONTRAST  TECHNIQUE: Multiplanar, multisequence MR imaging of the lumbar spine was performed. No intravenous contrast was administered.  COMPARISON:  Lumbar spine radiographs 12/01/2019  FINDINGS: Segmentation:  Standard.  Alignment: Mild lumbar levoscoliosis. Trace anterolisthesis of L2 on L3 and L3 on L4 and trace retrolisthesis of L5 on S1.  Vertebrae: No fracture, suspicious osseous lesion, or evidence of discitis. Low level right facet edema at L3-4. Multilevel chronic degenerative endplate changes, most notable at L5-S1. Small Schmorl's nodes at T11-12 and L2-3.  Conus medullaris and cauda equina: Conus extends to the L1-2 level. Conus and cauda equina appear normal.  Paraspinal and other soft tissues: Unremarkable.  Disc levels:  Disc desiccation throughout the lumbar spine with moderate to severe disc space narrowing at L5-S1 and mild narrowing elsewhere. Diffuse congenital narrowing of the lumbar spinal canal due to short pedicles.  T12-L1: Mild-to-moderate facet and ligamentum flavum hypertrophy without disc herniation or stenosis.  L1-2: Disc bulging, a small central disc protrusion, and moderate facet and ligamentum flavum hypertrophy result in moderate spinal stenosis, mild-to-moderate bilateral lateral recess stenosis, and mild right neural foraminal stenosis.  L2-3: Disc bulging and severe facet and ligamentum flavum hypertrophy result in severe  spinal stenosis and moderate right neural foraminal stenosis.  L3-4: Disc bulging and severe facet and ligamentum flavum hypertrophy result in severe spinal stenosis, moderate to severe bilateral lateral recess stenosis, and moderate right neural foraminal stenosis.  L4-5: Disc bulging and severe facet and ligamentum flavum hypertrophy result in severe spinal stenosis, severe bilateral lateral recess stenosis, and moderate right and severe left neural foraminal stenosis.  L5-S1: Disc bulging and moderate facet and ligamentum flavum hypertrophy result in mild spinal stenosis, moderate left greater than right lateral recess stenosis, and moderate right and severe left neural foraminal stenosis.  IMPRESSION: 1. 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 2. Moderate to severe multilevel neural foraminal stenosis.   Electronically Signed   By: Sebastian Ache M.D.   On: 05/13/2020 22:25     Objective:  VS:  HT:    WT:   BMI:     BP:131/73  HR:(!) 57bpm  TEMP: ( )  RESP:  Physical Exam Constitutional:      General: He is not in acute distress.    Appearance: Normal appearance. He is not ill-appearing.  HENT:     Head: Normocephalic and atraumatic.     Right Ear: External ear normal.     Left Ear: External ear normal.  Eyes:     Extraocular Movements: Extraocular movements intact.  Cardiovascular:     Rate  and Rhythm: Normal rate.     Pulses: Normal pulses.  Abdominal:     General: There is no distension.     Palpations: Abdomen is soft.  Musculoskeletal:        General: No tenderness or signs of injury.     Right lower leg: No edema.     Left lower leg: No edema.     Comments: Patient has good distal strength without clonus.  Skin:    Findings: No erythema or rash.  Neurological:     General: No focal deficit present.     Mental Status: He is alert and oriented to  person, place, and time.     Sensory: No sensory deficit.     Motor: No weakness or abnormal muscle tone.     Coordination: Coordination normal.  Psychiatric:        Mood and Affect: Mood normal.        Behavior: Behavior normal.      Imaging: No results found.

## 2020-07-17 ENCOUNTER — Ambulatory Visit (INDEPENDENT_AMBULATORY_CARE_PROVIDER_SITE_OTHER): Payer: Medicare PPO | Admitting: Orthopaedic Surgery

## 2020-07-17 ENCOUNTER — Encounter: Payer: Self-pay | Admitting: Orthopaedic Surgery

## 2020-07-17 ENCOUNTER — Other Ambulatory Visit: Payer: Self-pay

## 2020-07-17 ENCOUNTER — Ambulatory Visit (INDEPENDENT_AMBULATORY_CARE_PROVIDER_SITE_OTHER): Payer: Medicare PPO

## 2020-07-17 DIAGNOSIS — G8929 Other chronic pain: Secondary | ICD-10-CM | POA: Diagnosis not present

## 2020-07-17 DIAGNOSIS — M25562 Pain in left knee: Secondary | ICD-10-CM | POA: Diagnosis not present

## 2020-07-17 MED ORDER — METHYLPREDNISOLONE ACETATE 40 MG/ML IJ SUSP
40.0000 mg | INTRAMUSCULAR | Status: AC | PRN
  Administered 2020-07-17: 40 mg via INTRA_ARTICULAR

## 2020-07-17 MED ORDER — BUPIVACAINE HCL 0.5 % IJ SOLN
2.0000 mL | INTRAMUSCULAR | Status: AC | PRN
  Administered 2020-07-17: 2 mL via INTRA_ARTICULAR

## 2020-07-17 MED ORDER — LIDOCAINE HCL 1 % IJ SOLN
2.0000 mL | INTRAMUSCULAR | Status: AC | PRN
  Administered 2020-07-17: 2 mL
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
## 2020-08-05 ENCOUNTER — Other Ambulatory Visit: Payer: Self-pay | Admitting: Family Medicine

## 2020-08-14 ENCOUNTER — Ambulatory Visit (INDEPENDENT_AMBULATORY_CARE_PROVIDER_SITE_OTHER): Payer: Medicare PPO | Admitting: Orthopaedic Surgery

## 2020-08-14 ENCOUNTER — Other Ambulatory Visit: Payer: Self-pay

## 2020-08-14 ENCOUNTER — Telehealth: Payer: Self-pay

## 2020-08-14 ENCOUNTER — Encounter: Payer: Self-pay | Admitting: Orthopaedic Surgery

## 2020-08-14 DIAGNOSIS — M170 Bilateral primary osteoarthritis of knee: Secondary | ICD-10-CM | POA: Diagnosis not present

## 2020-08-14 MED ORDER — TRAMADOL HCL 50 MG PO TABS
50.0000 mg | ORAL_TABLET | Freq: Three times a day (TID) | ORAL | 0 refills | Status: AC | PRN
Start: 2020-08-14 — End: ?

## 2020-08-14 NOTE — Telephone Encounter (Signed)
Please submit for bil knee gel inj °

## 2020-08-14 NOTE — Progress Notes (Signed)
Office Visit Note   Patient: Aaron Mendez           Date of Birth: 04-02-1946           MRN: [id number] Visit Date: 08/14/2020              Requested by: No referring provider defined for this encounter. PCP: Patient, No Pcp Per   Assessment & Plan: Visit Diagnoses:  1. Bilateral primary osteoarthritis of knee     Plan: Impression is advanced degenerative joint disease both knees left greater than right.  We have discussed viscosupplementation injection for both knees today as the cortisone has not significantly helped.  We will submit for approval.  He will follow up with [place] once that has been completed.  I will call in tramadol to take in the meantime as needed This patient is diagnosed with osteoarthritis of the knee(s).    Radiographs show evidence of joint space narrowing, osteophytes, subchondral sclerosis and/or subchondral cysts.  This patient has knee pain which interferes with functional and activities of daily living.    This patient has experienced inadequate response, adverse effects and/or intolerance with conservative treatments such as acetaminophen, NSAIDS, topical creams, physical therapy or regular exercise, knee bracing and/or weight loss.   This patient has experienced inadequate response or has a contraindication to intra articular steroid injections for at least 3 months.   This patient is not scheduled to have a total knee replacement within 6 months of starting treatment with viscosupplementation.   Follow-Up Instructions: Return for follow up after visco approval for both knees.   Orders:  No orders of the defined types were placed in this encounter.  Meds ordered this encounter  Medications  . traMADol (ULTRAM) 50 MG tablet    Sig: Take 1 tablet (50 mg total) by mouth 3 (three) times daily as needed.    Dispense:  30 tablet    Refill:  0      Procedures: No procedures performed   Clinical Data: No additional findings.   Subjective: Chief  Complaint  Patient presents with  . Left Knee - Pain  . Right Knee - Pain    HPI patient is a very pleasant 74 year old gentleman who comes in today with bilateral knee pain left greater than right.  He was seen by [place] about a month ago where his left knee was injected with cortisone.  He had moderate relief for about 2 weeks and his pain has slowly started to return.  He is now having right knee pain as well.  He is having increased pain with ambulation as well as at night while trying to sleep.  He has been taking Advil without significant relief of symptoms.  Review of Systems as detailed in HPI.  All others reviewed and are negative.   Objective: Vital Signs: There were no vitals taken for this visit.  Physical Exam well-developed well-nourished gentleman in no acute distress.  Alert oriented x3.  Ortho Exam bilateral knee exams show no effusion.  Range of motion 0 to 120 degrees.  Mild medial joint line tenderness both sides.  Moderate patellofemoral crepitus.  He is neurovascular intact distally.  Specialty Comments:  No specialty comments available.  Imaging: No new imaging   PMFS History: Patient Active Problem List   Diagnosis Date Noted  . Weight loss 01/18/2020  . Myalgia 12/28/2019  . Urinary dribbling 12/28/2019  . Urinary incontinence 12/04/2019  . 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  Type 2 diabetes mellitus without complications (HCC) 11/09/2019  . Acute CVA (cerebrovascular accident) (HCC) 10/29/2019  . Fever due to COVID-19 10/12/2019  . Acute bilateral low back pain without sciatica 05/06/2019  . Poison ivy dermatitis 02/04/2019  . Erectile dysfunction 06/29/2018  . Encounter for well adult exam with abnormal findings 12/21/2017  . Enlarged prostate on rectal examination 12/21/2017  . Unilateral primary osteoarthritis, left knee 12/21/2017   Past Medical History:  Diagnosis Date  . Bilateral primary osteoarthritis of knee    . Sickle cell trait (HCC)     Family History  Problem Relation Age of Onset  . Sickle cell anemia Son     History reviewed. No pertinent surgical history. Social History   Occupational History  . Not on file  Tobacco Use  . Smoking status: Never Smoker  . Smokeless tobacco: Never Used  Substance and Sexual Activity  . Alcohol use: No  . Drug use: No  . Sexual activity: Yes

## 2020-08-15 NOTE — Telephone Encounter (Signed)
Noted  

## 2020-08-23 ENCOUNTER — Telehealth: Payer: Self-pay

## 2020-08-23 NOTE — Telephone Encounter (Signed)
Submitted VOB, Monovisc, bilateral knee. 

## 2020-08-24 ENCOUNTER — Telehealth: Payer: Self-pay

## 2020-08-24 NOTE — Telephone Encounter (Signed)
PA required for Monovisc, bilateral knee. PA submitted online through [facility].

## 2020-08-27 ENCOUNTER — Telehealth: Payer: Self-pay

## 2020-08-27 NOTE — Telephone Encounter (Signed)
Talked with patient concerning gel injections and patient stated that he would like to wait until after Christmas to proceed.  Approved, Monovisc, bilateral knee. Buy and Bill Must meet deductible first Once deductible is met, patient will be responsible for coinsurance. Co-pay of $50.00 PA required PA Approval# [id number] Valid 08/24/2020- 02/22/2021

## 2020-09-05 DIAGNOSIS — L02212 Cutaneous abscess of back [any part, except buttock]: Secondary | ICD-10-CM | POA: Diagnosis not present

## 2020-09-05 DIAGNOSIS — B356 Tinea cruris: Secondary | ICD-10-CM | POA: Diagnosis not present

## 2020-09-12 ENCOUNTER — Other Ambulatory Visit: Payer: Self-pay

## 2020-09-12 NOTE — Patient Outreach (Signed)
[facility]) Care Management  [place]  09/12/2020   Aaron Mendez 06-06-1946 [id number]  Subjective: Telephone call to patient.  He reports he is doing good.  He states his sugars are in the 110-120 range.  Discussed continued management of diabetes.  He verbalized understanding and voices no concerns.   Objective:   Encounter Medications:  Outpatient Encounter Medications as of 09/12/2020  Medication Sig  . Accu-Chek Softclix Lancets lancets Use to check glucose up to two times per day.  Dx: E11.9  . aspirin EC 325 MG tablet TAKE 1 TABLET(325 MG) BY MOUTH DAILY  . Blood Glucose Monitoring Suppl (ACCU-CHEK AVIVA PLUS) w/Device KIT Use to check glucose up to two times per day.  Dx: E11.9  . clopidogrel (PLAVIX) 75 MG tablet Take 1 tablet (75 mg total) by mouth daily.  Marland Kitchen glucose blood (ACCU-CHEK AVIVA PLUS) test strip Use to check glucose up to two times per day.  Dx: E11.9  . meloxicam (MOBIC) 7.5 MG tablet TAKE 2 TABLETS BY MOUTH DAILY AS NEEDED FOR PAIN. TAKE ONCE YOU'VE COMPLETED STEROID PACK  . metFORMIN (GLUCOPHAGE) 500 MG tablet TAKE 1 TABLET(500 MG) BY MOUTH DAILY WITH BREAKFAST  . methocarbamol (ROBAXIN) 500 MG tablet Take 1 tablet (500 mg total) by mouth 2 (two) times daily as needed.  . methylPREDNISolone (MEDROL DOSEPAK) 4 MG TBPK tablet Take as directed  . Multiple Vitamin (MULTIVITAMIN WITH MINERALS) TABS tablet Take 1 tablet by mouth daily.  . rosuvastatin (CRESTOR) 10 MG tablet TAKE 1 TABLET(10 MG) BY MOUTH DAILY  . traMADol (ULTRAM) 50 MG tablet Take 1 tablet (50 mg total) by mouth 3 (three) times daily as needed.   No facility-administered encounter medications on file as of 09/12/2020.    Functional Status:  In your present state of health, do you have any difficulty performing the following activities: 09/12/2020 11/21/2019  Hearing? N N  Vision? N N  Difficulty concentrating or making decisions? N N  Walking or climbing stairs? N N  Dressing  or bathing? N N  Doing errands, shopping? N N  Preparing Food and eating ? N N  Using the Toilet? N N  In the past six months, have you accidently leaked urine? N N  Do you have problems with loss of bowel control? - N  Managing your Medications? Tempie Donning  Comment friend helps with medications friend places in pill box for him  Managing your Finances? N N  Housekeeping or managing your Housekeeping? N N  Some recent data might be hidden    Fall/Depression Screening: Fall Risk  09/12/2020 12/29/2019 11/21/2019  Falls in the past year? 0 0 0  Number falls in past yr: 0 - -  Injury with Fall? 0 - -   PHQ 2/9 Scores 09/12/2020 12/29/2019 11/21/2019 10/19/2019 05/06/2019 12/21/2017  PHQ - 2 Score 0 0 0 0 0 0    Assessment:  Patient continues to manage chronic conditions.   Goals Addressed            This Visit's Progress   . Make and Keep All Appointments       Timeframe:  Long-Range Goal Priority:  High Start Date:     09/12/20                        Expected End Date:   01/05/21  Follow Up Date 01/05/21   - arrange a ride through an agency 1 week before appointment - keep a calendar with appointment dates    Why is this important?    Part of staying healthy is seeing the doctor for follow-up care.   If you forget your appointments, there are some things you can do to stay on track.    Notes:     Marland Kitchen Monitor and Manage My Blood Sugar-Diabetes Type 2       Timeframe:  Long-Range Goal Priority:  High Start Date:   09/12/20                          Expected End Date:    01/05/21                   Follow Up Date 01/05/21   - check blood sugar at prescribed times - take the blood sugar log to all doctor visits    Why is this important?    Checking your blood sugar at home helps to keep it from getting very high or very low.   Writing the results in a diary or log helps the doctor know how to care for you.   Your blood sugar log should have the time, date and the  results.   Also, write down the amount of insulin or other medicine that you take.   Other information, like what you ate, exercise done and how you were feeling, will also be helpful.     Notes:        Plan: RN CM will contact in the month of April and patient agreeable.   Follow-up:  Patient agrees to Care Plan and Follow-up.   Jone Baseman, RN, MSN [place] [facility] Care Management Coordinator Direct Line [phone] Cell [phone] Toll Free: [phone]  Fax: [phone]

## 2020-10-04 DIAGNOSIS — R5383 Other fatigue: Secondary | ICD-10-CM | POA: Diagnosis not present

## 2020-10-04 DIAGNOSIS — M1990 Unspecified osteoarthritis, unspecified site: Secondary | ICD-10-CM | POA: Diagnosis not present

## 2020-10-04 DIAGNOSIS — L02212 Cutaneous abscess of back [any part, except buttock]: Secondary | ICD-10-CM | POA: Diagnosis not present

## 2020-10-04 DIAGNOSIS — R739 Hyperglycemia, unspecified: Secondary | ICD-10-CM | POA: Diagnosis not present

## 2020-10-04 DIAGNOSIS — E1169 Type 2 diabetes mellitus with other specified complication: Secondary | ICD-10-CM | POA: Diagnosis not present

## 2020-10-04 DIAGNOSIS — E1159 Type 2 diabetes mellitus with other circulatory complications: Secondary | ICD-10-CM | POA: Diagnosis not present

## 2020-10-04 DIAGNOSIS — I10 Essential (primary) hypertension: Secondary | ICD-10-CM | POA: Diagnosis not present

## 2020-10-04 DIAGNOSIS — E782 Mixed hyperlipidemia: Secondary | ICD-10-CM | POA: Diagnosis not present

## 2020-11-07 ENCOUNTER — Other Ambulatory Visit: Payer: Self-pay | Admitting: Family Medicine

## 2020-11-08 ENCOUNTER — Telehealth: Payer: Self-pay

## 2020-11-08 NOTE — Telephone Encounter (Signed)
LVM for patient to call back to get this appt scheduled for any further refills. AM

## 2020-11-08 NOTE — Telephone Encounter (Signed)
Pls schedule patient for appt with labs. No labs since 12/2019. Sending 30 day refills to hold pt until appt

## 2020-12-07 ENCOUNTER — Other Ambulatory Visit: Payer: Self-pay

## 2020-12-07 NOTE — Patient Outreach (Signed)
[facility]) Care Management  [place]  12/07/2020   Aaron Mendez August 26, 1946 [id number]  Subjective: Telephone call to patient for disease management follow up. Patient reports doing good.  Discussed continued diabetes management and diet.  He verbalized understanding.    Objective:   Encounter Medications:  Outpatient Encounter Medications as of 12/07/2020  Medication Sig  . Accu-Chek Softclix Lancets lancets Use to check glucose up to two times per day.  Dx: E11.9  . aspirin EC 325 MG tablet TAKE 1 TABLET(325 MG) BY MOUTH DAILY  . Blood Glucose Monitoring Suppl (ACCU-CHEK AVIVA PLUS) w/Device KIT Use to check glucose up to two times per day.  Dx: E11.9  . clopidogrel (PLAVIX) 75 MG tablet TAKE 1 TABLET(75 MG) BY MOUTH DAILY  . glucose blood (ACCU-CHEK AVIVA PLUS) test strip Use to check glucose up to two times per day.  Dx: E11.9  . meloxicam (MOBIC) 7.5 MG tablet TAKE 2 TABLETS BY MOUTH DAILY AS NEEDED FOR PAIN. TAKE ONCE YOU'VE COMPLETED STEROID PACK  . metFORMIN (GLUCOPHAGE) 500 MG tablet TAKE 1 TABLET(500 MG) BY MOUTH DAILY WITH BREAKFAST  . methocarbamol (ROBAXIN) 500 MG tablet Take 1 tablet (500 mg total) by mouth 2 (two) times daily as needed.  . methylPREDNISolone (MEDROL DOSEPAK) 4 MG TBPK tablet Take as directed  . Multiple Vitamin (MULTIVITAMIN WITH MINERALS) TABS tablet Take 1 tablet by mouth daily.  . rosuvastatin (CRESTOR) 10 MG tablet TAKE 1 TABLET(10 MG) BY MOUTH DAILY  . traMADol (ULTRAM) 50 MG tablet Take 1 tablet (50 mg total) by mouth 3 (three) times daily as needed.   No facility-administered encounter medications on file as of 12/07/2020.    Functional Status:  In your present state of health, do you have any difficulty performing the following activities: 09/12/2020  Hearing? N  Vision? N  Difficulty concentrating or making decisions? N  Walking or climbing stairs? N  Dressing or bathing? N  Doing errands, shopping? N  Preparing Food  and eating ? N  Using the Toilet? N  In the past six months, have you accidently leaked urine? N  Managing your Medications? Y  Comment friend helps with medications  Managing your Finances? N  Housekeeping or managing your Housekeeping? N  Some recent data might be hidden    Fall/Depression Screening: Fall Risk  09/12/2020 12/29/2019 11/21/2019  Falls in the past year? 0 0 0  Number falls in past yr: 0 - -  Injury with Fall? 0 - -   PHQ 2/9 Scores 09/12/2020 12/29/2019 11/21/2019 10/19/2019 05/06/2019 12/21/2017  PHQ - 2 Score 0 0 0 0 0 0    Assessment:  Goals Addressed            This Visit's Progress   . Make and Keep All Appointments       Timeframe:  Long-Range Goal Priority:  High Start Date:     09/12/20                        Expected End Date:   08/07/21                  Follow Up Date 04/07/21   - ask family or friend for a ride    Why is this important?    Part of staying healthy is seeing the doctor for follow-up care.   If you forget your appointments, there are some things you can do to stay on track.  Notes:     Marland Kitchen Monitor and Manage My Blood Sugar-Diabetes Type 2       Timeframe:  Long-Range Goal Priority:  High Start Date:   09/12/20                          Expected End Date:  08/07/21                  Follow Up Date 04/07/21   - check blood sugar at prescribed times - take the blood sugar log to all doctor visits    Why is this important?    Checking your blood sugar at home helps to keep it from getting very high or very low.   Writing the results in a diary or log helps the doctor know how to care for you.   Your blood sugar log should have the time, date and the results.   Also, write down the amount of insulin or other medicine that you take.   Other information, like what you ate, exercise done and how you were feeling, will also be helpful.     Notes: 12/07/20 Patient checks sugars about 1 time a week.  Range 110-120. Patient reports diet  changes.        Plan:  RN CM will outreach again in July. Follow-up:  Patient agrees to Care Plan and Follow-up.   Jone Baseman, RN, MSN [place] [facility] Care Management Coordinator Direct Line [phone] Cell [phone] Toll Free: [phone]  Fax: [phone]

## 2020-12-12 ENCOUNTER — Ambulatory Visit: Payer: Self-pay

## 2020-12-24 ENCOUNTER — Other Ambulatory Visit: Payer: Self-pay | Admitting: Family Medicine

## 2020-12-25 ENCOUNTER — Other Ambulatory Visit: Payer: Self-pay

## 2020-12-25 DIAGNOSIS — I63442 Cerebral infarction due to embolism of left cerebellar artery: Secondary | ICD-10-CM

## 2020-12-25 MED ORDER — CLOPIDOGREL BISULFATE 75 MG PO TABS: ORAL_TABLET | ORAL | 1 refills | Status: DC

## 2020-12-25 MED ORDER — ROSUVASTATIN CALCIUM 10 MG PO TABS: ORAL_TABLET | ORAL | 1 refills | Status: AC

## 2021-03-03 ENCOUNTER — Other Ambulatory Visit: Payer: Self-pay | Admitting: Family Medicine

## 2021-03-08 ENCOUNTER — Other Ambulatory Visit: Payer: Self-pay

## 2021-03-08 NOTE — Patient Outreach (Addendum)
[facility]) Care Management  [place]  03/08/2021   Aaron Mendez [birth year] [id number]  Subjective: Telephone call to patient for disease management. He reports doing. He denies any problems. Diabetes management discussed.    Objective:   Encounter Medications:  Outpatient Encounter Medications as of 03/08/2021  Medication Sig   Accu-Chek Softclix Lancets lancets Use to check glucose up to two times per day.  Dx: E11.9   aspirin EC 325 MG tablet TAKE 1 TABLET(325 MG) BY MOUTH DAILY   Blood Glucose Monitoring Suppl (ACCU-CHEK AVIVA PLUS) w/Device KIT Use to check glucose up to two times per day.  Dx: E11.9   clopidogrel (PLAVIX) 75 MG tablet TAKE 1 TABLET(75 MG) BY MOUTH DAILY   glucose blood (ACCU-CHEK AVIVA PLUS) test strip Use to check glucose up to two times per day.  Dx: E11.9   meloxicam (MOBIC) 7.5 MG tablet TAKE 2 TABLETS BY MOUTH DAILY AS NEEDED FOR PAIN. TAKE ONCE YOU'VE COMPLETED STEROID PACK   metFORMIN (GLUCOPHAGE) 500 MG tablet TAKE 1 TABLET(500 MG) BY MOUTH DAILY WITH BREAKFAST   methocarbamol (ROBAXIN) 500 MG tablet Take 1 tablet (500 mg total) by mouth 2 (two) times daily as needed.   methylPREDNISolone (MEDROL DOSEPAK) 4 MG TBPK tablet Take as directed   Multiple Vitamin (MULTIVITAMIN WITH MINERALS) TABS tablet Take 1 tablet by mouth daily.   rosuvastatin (CRESTOR) 10 MG tablet TAKE 1 TABLET(10 MG) BY MOUTH DAILY   traMADol (ULTRAM) 50 MG tablet Take 1 tablet (50 mg total) by mouth 3 (three) times daily as needed.   No facility-administered encounter medications on file as of 03/08/2021.    Functional Status:  In your present state of health, do you have any difficulty performing the following activities: 03/08/2021 09/12/2020  Hearing? N N  Vision? N N  Difficulty concentrating or making decisions? N N  Walking or climbing stairs? N N  Dressing or bathing? N N  Doing errands, shopping? N N  Preparing Food and eating ? N N  Using the  Toilet? N N  In the past six months, have you accidently leaked urine? N N  Do you have problems with loss of bowel control? N -  Managing your Medications? Y Y  Comment friend assists friend helps with medications  Managing your Finances? - N  Housekeeping or managing your Housekeeping? - N  Some recent data might be hidden    Fall/Depression Screening: Fall Risk  03/08/2021 09/12/2020 12/29/2019  Falls in the past year? 0 0 0  Number falls in past yr: - 0 -  Injury with Fall? - 0 -   PHQ 2/9 Scores 03/08/2021 09/12/2020 12/29/2019 11/21/2019 10/19/2019 05/06/2019 12/21/2017  PHQ - 2 Score 0 0 0 0 0 0 0    Assessment:   Care Plan Care Plan : Diabetes Type 2 (Adult)  Updates made by Jon Billings, RN since 03/08/2021 12:00 AM     Problem: Disease Progression (Diabetes, Type 2)      Long-Range Goal: Disease Progression Prevented or Minimized as evidenced by A1c less than 9   Start Date: 09/12/2020  Expected End Date: 09/07/2021  This Visit's Progress: On track  Recent Progress: On track  Priority: High  Note:   Evidence-based guidance:   Promote self-monitoring of blood glucose levels.  Notes:    Task: Monitor and Manage Follow-up for Comorbidities   Due Date: 09/07/2021  Priority: Routine  Responsible User: Jon Billings, RN  Note:   Care Management Activities:    -  healthy lifestyle promoted - reduction of sedentary activity encouraged - response to pharmacologic therapy monitored    Notes: 12/07/20 Patient reports that sugars running 110-120. Patient has completely changed diet.  03/08/21 Patient reports doing well.  Blood sugar range continues 110-120.  Encouraged patient to continue and discussed importance of diabetes management Diabetes Management Discussed: Medication adherence Reviewed importance of limiting carbs such as rice, potatoes, breads, and pastas. Also discussed limiting sweets and sugary drinks.  Discussed importance of portion control.  Also discussed  importance of annual exams, foot exams, and eye exams.        Goals Addressed             This Visit's Progress    COMPLETED: Make and Keep All Appointments   On track    Timeframe:  Long-Range Goal Priority:  High Start Date:     09/12/20                        Expected End Date:   08/07/21                  Follow Up Date 04/07/21   - ask family or friend for a ride    Why is this important?   Part of staying healthy is seeing the doctor for follow-up care.  If you forget your appointments, there are some things you can do to stay on track.    Notes:       Monitor and Manage My Blood Sugar-Diabetes Type 2   On track    Timeframe:  Long-Range Goal Priority:  High Start Date:   09/12/20                          Expected End Date:  09/07/21                  Follow Up Date 07/08/21   - check blood sugar at prescribed times - take the blood sugar log to all doctor visits    Why is this important?   Checking your blood sugar at home helps to keep it from getting very high or very low.  Writing the results in a diary or log helps the doctor know how to care for you.  Your blood sugar log should have the time, date and the results.  Also, write down the amount of insulin or other medicine that you take.  Other information, like what you ate, exercise done and how you were feeling, will also be helpful.     Notes: 12/07/20 Patient checks sugars about 1 time a week.  Range 110-120. Patient reports diet changes.  03/08/21 Patient reports doing well.  Blood sugar range continues 110-120.  Encouraged patient to continue and discussed importance of diabetes management Diabetes Management Discussed: Medication adherence Reviewed importance of limiting carbs such as rice, potatoes, breads, and pastas. Also discussed limiting sweets and sugary drinks.  Discussed importance of portion control.  Also discussed importance of annual exams, foot exams, and eye exams.           Plan:   Follow-up: Patient agrees to Care Plan and Follow-up. Follow-up in 3 month(s)  Jone Baseman, RN, MSN [place] Management Care Management Coordinator Direct Line [phone] Cell [phone] Toll Free: [phone]  Fax: [phone]

## 2021-03-08 NOTE — Patient Instructions (Signed)
Goals Addressed             This Visit's Progress    COMPLETED: Make and Keep All Appointments   On track    Timeframe:  Long-Range Goal Priority:  High Start Date:     09/12/20                        Expected End Date:   08/07/21                  Follow Up Date 04/07/21   - ask family or friend for a ride    Why is this important?   Part of staying healthy is seeing the doctor for follow-up care.  If you forget your appointments, there are some things you can do to stay on track.    Notes:       Monitor and Manage My Blood Sugar-Diabetes Type 2   On track    Timeframe:  Long-Range Goal Priority:  High Start Date:   09/12/20                          Expected End Date:  09/07/21                  Follow Up Date 07/08/21   - check blood sugar at prescribed times - take the blood sugar log to all doctor visits    Why is this important?   Checking your blood sugar at home helps to keep it from getting very high or very low.  Writing the results in a diary or log helps the doctor know how to care for you.  Your blood sugar log should have the time, date and the results.  Also, write down the amount of insulin or other medicine that you take.  Other information, like what you ate, exercise done and how you were feeling, will also be helpful.     Notes: 12/07/20 Patient checks sugars about 1 time a week.  Range 110-120. Patient reports diet changes.  03/08/21 Patient reports doing well.  Blood sugar range continues 110-120.  Encouraged patient to continue and discussed importance of diabetes management Diabetes Management Discussed: Medication adherence Reviewed importance of limiting carbs such as rice, potatoes, breads, and pastas. Also discussed limiting sweets and sugary drinks.  Discussed importance of portion control.  Also discussed importance of annual exams, foot exams, and eye exams.

## 2021-03-26 ENCOUNTER — Encounter: Payer: Self-pay | Admitting: Orthopaedic Surgery

## 2021-03-26 ENCOUNTER — Ambulatory Visit (INDEPENDENT_AMBULATORY_CARE_PROVIDER_SITE_OTHER): Payer: Medicare HMO | Admitting: Orthopaedic Surgery

## 2021-03-26 DIAGNOSIS — M170 Bilateral primary osteoarthritis of knee: Secondary | ICD-10-CM | POA: Diagnosis not present

## 2021-03-26 NOTE — Progress Notes (Signed)
   Post-Op Visit Note   Patient: Aaron Mendez           Date of Birth: Nov 18, 1945           MRN: [id number] Visit Date: 03/26/2021 PCP: Lewis Moccasin, MD   Assessment & Plan:  Chief Complaint:  Chief Complaint  Patient presents with   Left Knee - Pain   Visit Diagnoses:  1. Bilateral primary osteoarthritis of knee     Plan: Aaron Mendez returns today for follow-up of bilateral knee pain.  His Visco authorization expired.  He states that he has changes mind multiple times about getting the Visco injections.  He has fair amount of anxiety with needles.  He is not ready for knee replacement.  At this point he will return back to see [place] or give [place] a call when he is ready for Visco injections again but we will need to resubmit an authorization with his insurance.  Follow-Up Instructions: No follow-ups on file.   Orders:  No orders of the defined types were placed in this encounter.  No orders of the defined types were placed in this encounter.   Imaging: No results found.  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   Type 2 diabetes mellitus without complications (HCC) 11/09/2019   Acute CVA (cerebrovascular accident) (HCC) 10/29/2019   Fever due to COVID-19 10/12/2019   Acute bilateral low back pain without sciatica 05/06/2019   Poison ivy dermatitis 02/04/2019   Erectile dysfunction 06/29/2018   Encounter for well adult exam with abnormal findings 12/21/2017   Enlarged prostate on rectal examination 12/21/2017   Unilateral primary osteoarthritis, left knee 12/21/2017   Past Medical History:  Diagnosis Date   Bilateral primary osteoarthritis of knee    Sickle cell trait (HCC)     Family History  Problem Relation Age of Onset   Sickle cell anemia Son     History  reviewed. No pertinent surgical history. Social History   Occupational History   Not on file  Tobacco Use   Smoking status: Never   Smokeless tobacco: Never  Substance and Sexual Activity   Alcohol use: No   Drug use: No   Sexual activity: Yes

## 2021-06-05 DIAGNOSIS — I152 Hypertension secondary to endocrine disorders: Secondary | ICD-10-CM | POA: Diagnosis not present

## 2021-06-05 DIAGNOSIS — I639 Cerebral infarction, unspecified: Secondary | ICD-10-CM | POA: Diagnosis not present

## 2021-06-05 DIAGNOSIS — Z23 Encounter for immunization: Secondary | ICD-10-CM | POA: Diagnosis not present

## 2021-06-05 DIAGNOSIS — E782 Mixed hyperlipidemia: Secondary | ICD-10-CM | POA: Diagnosis not present

## 2021-06-05 DIAGNOSIS — E1165 Type 2 diabetes mellitus with hyperglycemia: Secondary | ICD-10-CM | POA: Diagnosis not present

## 2021-06-14 ENCOUNTER — Other Ambulatory Visit: Payer: Self-pay

## 2021-06-14 NOTE — Patient Outreach (Signed)
[facility]) Care Management  [place]  06/14/2021   Aaron Mendez [birth year] [id number]  Subjective: Telephone call to patient for follow up. Patient reports doing good health wise.  He denies any problems. Diabetes management reiterated.    Objective:   Encounter Medications:  Outpatient Encounter Medications as of 06/14/2021  Medication Sig   Accu-Chek Softclix Lancets lancets Use to check glucose up to two times per day.  Dx: E11.9   aspirin EC 325 MG tablet TAKE 1 TABLET(325 MG) BY MOUTH DAILY   Blood Glucose Monitoring Suppl (ACCU-CHEK AVIVA PLUS) w/Device KIT Use to check glucose up to two times per day.  Dx: E11.9   clopidogrel (PLAVIX) 75 MG tablet TAKE 1 TABLET(75 MG) BY MOUTH DAILY   glucose blood (ACCU-CHEK AVIVA PLUS) test strip Use to check glucose up to two times per day.  Dx: E11.9   meloxicam (MOBIC) 7.5 MG tablet TAKE 2 TABLETS BY MOUTH DAILY AS NEEDED FOR PAIN. TAKE ONCE YOU'VE COMPLETED STEROID PACK   metFORMIN (GLUCOPHAGE) 500 MG tablet TAKE 1 TABLET(500 MG) BY MOUTH DAILY WITH BREAKFAST   methocarbamol (ROBAXIN) 500 MG tablet Take 1 tablet (500 mg total) by mouth 2 (two) times daily as needed.   methylPREDNISolone (MEDROL DOSEPAK) 4 MG TBPK tablet Take as directed   Multiple Vitamin (MULTIVITAMIN WITH MINERALS) TABS tablet Take 1 tablet by mouth daily.   rosuvastatin (CRESTOR) 10 MG tablet TAKE 1 TABLET(10 MG) BY MOUTH DAILY   traMADol (ULTRAM) 50 MG tablet Take 1 tablet (50 mg total) by mouth 3 (three) times daily as needed.   No facility-administered encounter medications on file as of 06/14/2021.    Functional Status:  In your present state of health, do you have any difficulty performing the following activities: 03/08/2021 09/12/2020  Hearing? N N  Vision? N N  Difficulty concentrating or making decisions? N N  Walking or climbing stairs? N N  Dressing or bathing? N N  Doing errands, shopping? N N  Preparing Food and eating ? N N   Using the Toilet? N N  In the past six months, have you accidently leaked urine? N N  Do you have problems with loss of bowel control? N -  Managing your Medications? Y Y  Comment friend assists friend helps with medications  Managing your Finances? - N  Housekeeping or managing your Housekeeping? - N  Some recent data might be hidden    Fall/Depression Screening: Fall Risk  03/08/2021 09/12/2020 12/29/2019  Falls in the past year? 0 0 0  Number falls in past yr: - 0 -  Injury with Fall? - 0 -   PHQ 2/9 Scores 03/08/2021 09/12/2020 12/29/2019 11/21/2019 10/19/2019 05/06/2019 12/21/2017  PHQ - 2 Score 0 0 0 0 0 0 0    Assessment:   Care Plan Care Plan : Diabetes Type 2 (Adult)  Updates made by Jon Billings, RN since 06/14/2021 12:00 AM     Problem: Disease Progression (Diabetes, Type 2)      Long-Range Goal: Disease Progression Prevented or Minimized as evidenced by A1c less than 9   Start Date: 09/12/2020  Expected End Date: 03/07/2022  This Visit's Progress: On track  Recent Progress: On track  Priority: High  Note:   Evidence-based guidance:   Promote self-monitoring of blood glucose levels.  Notes:    Task: Monitor and Manage Follow-up for Comorbidities   Due Date: 03/07/2022  Priority: Routine  Responsible User: Jon Billings, RN  Note:  Care Management Activities:    - healthy lifestyle promoted - reduction of sedentary activity encouraged - response to pharmacologic therapy monitored    Notes: 12/07/20 Patient reports that sugars running 110-120. Patient has completely changed diet.  03/08/21 Patient reports doing well.  Blood sugar range continues 110-120.  Encouraged patient to continue and discussed importance of diabetes management Diabetes Management Discussed: Medication adherence Reviewed importance of limiting carbs such as rice, potatoes, breads, and pastas. Also discussed limiting sweets and sugary drinks.  Discussed importance of portion control.  Also  discussed importance of annual exams, foot exams, and eye exams.   06/14/21 Patient reports he is doing good. No longer on diabetic medication.  Blood sugars running around 120. Reiterated diabetes management.  Did have A1c done but her does not remember the number but states the doctor states he was good.        Goals Addressed               This Visit's Progress     Monitor and Manage My Blood Sugar-Diabetes Type 2 (pt-stated)   On track     Barriers: Health Behaviors Timeframe:  Long-Range Goal Priority:  High Start Date:   09/12/20                          Expected End Date:  03/07/22              Follow Up Date 10/08/21    - check blood sugar at prescribed times - take the blood sugar log to all doctor visits    Why is this important?   Checking your blood sugar at home helps to keep it from getting very high or very low.  Writing the results in a diary or log helps the doctor know how to care for you.  Your blood sugar log should have the time, date and the results.  Also, write down the amount of insulin or other medicine that you take.  Other information, like what you ate, exercise done and how you were feeling, will also be helpful.     Notes: 12/07/20 Patient checks sugars about 1 time a week.  Range 110-120. Patient reports diet changes.  03/08/21 Patient reports doing well.  Blood sugar range continues 110-120.  Encouraged patient to continue and discussed importance of diabetes management Diabetes Management Discussed: Medication adherence Reviewed importance of limiting carbs such as rice, potatoes, breads, and pastas. Also discussed limiting sweets and sugary drinks.  Discussed importance of portion control.  Also discussed importance of annual exams, foot exams, and eye exams.   06/14/21 Patient reports he is doing good. No longer on diabetic medication.  Blood sugars running around 120. Reiterated diabetes management.  Did have A1c done but her does not remember the  number but states the doctor states he was good.         Plan:  Follow-up: Patient agrees to Care Plan and Follow-up. Follow-up in 3 month(s)  Jone Baseman, RN, MSN [place] Management Care Management Coordinator Direct Line [phone] Cell [phone] Toll Free: [phone]  Fax: [phone]

## 2021-06-14 NOTE — Patient Instructions (Signed)
Goals Addressed               This Visit's Progress     Monitor and Manage My Blood Sugar-Diabetes Type 2 (pt-stated)   On track     Barriers: Health Behaviors Timeframe:  Long-Range Goal Priority:  High Start Date:   09/12/20                          Expected End Date:  03/07/22              Follow Up Date 10/08/21    - check blood sugar at prescribed times - take the blood sugar log to all doctor visits    Why is this important?   Checking your blood sugar at home helps to keep it from getting very high or very low.  Writing the results in a diary or log helps the doctor know how to care for you.  Your blood sugar log should have the time, date and the results.  Also, write down the amount of insulin or other medicine that you take.  Other information, like what you ate, exercise done and how you were feeling, will also be helpful.     Notes: 12/07/20 Patient checks sugars about 1 time a week.  Range 110-120. Patient reports diet changes.  03/08/21 Patient reports doing well.  Blood sugar range continues 110-120.  Encouraged patient to continue and discussed importance of diabetes management Diabetes Management Discussed: Medication adherence Reviewed importance of limiting carbs such as rice, potatoes, breads, and pastas. Also discussed limiting sweets and sugary drinks.  Discussed importance of portion control.  Also discussed importance of annual exams, foot exams, and eye exams.   06/14/21 Patient reports he is doing good. No longer on diabetic medication.  Blood sugars running around 120. Reiterated diabetes management.  Did have A1c done but her does not remember the number but states the doctor states he was good.

## 2021-07-01 DIAGNOSIS — B029 Zoster without complications: Secondary | ICD-10-CM | POA: Diagnosis not present

## 2021-09-05 DIAGNOSIS — I10 Essential (primary) hypertension: Secondary | ICD-10-CM | POA: Diagnosis not present

## 2021-09-05 DIAGNOSIS — N529 Male erectile dysfunction, unspecified: Secondary | ICD-10-CM | POA: Diagnosis not present

## 2021-09-05 DIAGNOSIS — E119 Type 2 diabetes mellitus without complications: Secondary | ICD-10-CM | POA: Diagnosis not present

## 2021-09-05 DIAGNOSIS — Z23 Encounter for immunization: Secondary | ICD-10-CM | POA: Diagnosis not present

## 2021-09-05 DIAGNOSIS — I739 Peripheral vascular disease, unspecified: Secondary | ICD-10-CM | POA: Diagnosis not present

## 2021-09-10 DIAGNOSIS — Z0000 Encounter for general adult medical examination without abnormal findings: Secondary | ICD-10-CM | POA: Diagnosis not present

## 2021-09-10 DIAGNOSIS — Z125 Encounter for screening for malignant neoplasm of prostate: Secondary | ICD-10-CM | POA: Diagnosis not present

## 2021-09-13 ENCOUNTER — Other Ambulatory Visit: Payer: Self-pay

## 2021-09-13 ENCOUNTER — Ambulatory Visit: Payer: Self-pay

## 2021-09-13 NOTE — Patient Outreach (Signed)
[facility]) Care Management  09/13/2021  Aaron Mendez 1946/08/23 [id number]   Telephone Assessment Quarterly Call    Successful outreach call placed to patient. He reports he is doing well and denies any new issues or concerns at present. He continues to reside in his home alone and is independent with ADLs/IADLs. He has supportive daughter who lives nearby and able to assist as needed. No recent falls. Appetite remains good and wgt stable. He denies any RN CM needs or concerns at present.    Medications Reviewed Today     Reviewed by Hayden Pedro, RN (Registered Nurse) on 09/13/21 at [phone]  Med List Status: <None>   Medication Order Taking? Sig Documenting Provider Last Dose Status Informant  Accu-Chek Softclix Lancets lancets [id number] No Use to check glucose up to two times per day.  Dx: E11.9 Luetta Nutting, DO Taking Active   aspirin EC 325 MG tablet [id number]  TAKE 1 TABLET(325 MG) BY MOUTH DAILY Luetta Nutting, DO  Active   Blood Glucose Monitoring Suppl (ACCU-CHEK AVIVA PLUS) w/Device KIT [id number] No Use to check glucose up to two times per day.  Dx: E11.9 Luetta Nutting, DO Taking Active   clopidogrel (PLAVIX) 75 MG tablet [id number]  TAKE 1 TABLET(75 MG) BY MOUTH DAILY Luetta Nutting, DO  Active   glucose blood (ACCU-CHEK AVIVA PLUS) test strip [id number] No Use to check glucose up to two times per day.  Dx: E11.9 Luetta Nutting, DO Taking Active   meloxicam (MOBIC) 7.5 MG tablet [id number] No TAKE 2 TABLETS BY MOUTH DAILY AS NEEDED FOR PAIN. TAKE ONCE YOU'VE COMPLETED STEROID PACK Aundra Dubin, PA-C Taking Active   metFORMIN (GLUCOPHAGE) 500 MG tablet [id number]  TAKE 1 TABLET(500 MG) BY MOUTH DAILY WITH BREAKFAST Luetta Nutting, DO  Active   methocarbamol (ROBAXIN) 500 MG tablet [id number]  Take 1 tablet (500 mg total) by mouth 2 (two) times daily as needed. Aundra Dubin, PA-C  Active   methylPREDNISolone (MEDROL DOSEPAK) 4 MG TBPK tablet  [id number]  Take as directed Aundra Dubin, PA-C  Active   Multiple Vitamin (MULTIVITAMIN WITH MINERALS) TABS tablet [id number] No Take 1 tablet by mouth daily. [provider] Taking Active Child           Med Note (SATTER[place], Lupita Shutter Oct 31, 2019  4:21 PM)    rosuvastatin (CRESTOR) 10 MG tablet [id number]  TAKE 1 TABLET(10 MG) BY MOUTH DAILY Luetta Nutting, DO  Active   traMADol (ULTRAM) 50 MG tablet [id number]  Take 1 tablet (50 mg total) by mouth 3 (three) times daily as needed. Aundra Dubin, PA-C  Active                 Goals Addressed               This Visit's Progress     Monitor and Manage My Blood Sugar-Diabetes Type 2 (pt-stated)        Barriers: Health Behaviors Timeframe:  Long-Range Goal Priority:  High Start Date:   09/12/20                          Expected End Date:  03/07/22              Follow Up Date April 2023    - check blood sugar at prescribed times - take the blood sugar log to all doctor visits    Why is this  important?   Checking your blood sugar at home helps to keep it from getting very high or very low.  Writing the results in a diary or log helps the doctor know how to care for you.  Your blood sugar log should have the time, date and the results.  Also, write down the amount of insulin or other medicine that you take.  Other information, like what you ate, exercise done and how you were feeling, will also be helpful.     Notes: 12/07/20 Patient checks sugars about 1 time a week.  Range 110-120. Patient reports diet changes.  03/08/21 Patient reports doing well.  Blood sugar range continues 110-120.  Encouraged patient to continue and discussed importance of diabetes management Diabetes Management Discussed: Medication adherence Reviewed importance of limiting carbs such as rice, potatoes, breads, and pastas. Also discussed limiting sweets and sugary drinks.  Discussed importance of portion control.  Also discussed  importance of annual exams, foot exams, and eye exams.   06/14/21 Patient reports he is doing good. No longer on diabetic medication.  Blood sugars running around 120. Reiterated diabetes management.  Did have A1c done but her does not remember the number but states the doctor states he was good.   09/13/21-Patient reports he saw PCP two wks ago and had A1C level along with other lab work done but unable to recall values. He states MD told him "everything was good." He admits that he has not been checking cbgs consistently in the home and encouraged to do so. He reports he has increased his fruits and veggies intake and remained consistent in diet even over the recent holidays.            Plan: RN CM discussed with patient next outreach within the month of April. Patient agrees to care plan and follow up. RN CM will send quarterly update to PCP.   Enzo Montgomery, RN,BSN,CCM [place] Management Telephonic Care Management Coordinator Direct Phone: [phone] Toll Free: [phone] Fax: [phone]

## 2021-11-07 ENCOUNTER — Other Ambulatory Visit: Payer: Self-pay | Admitting: Family Medicine

## 2021-11-15 DIAGNOSIS — N529 Male erectile dysfunction, unspecified: Secondary | ICD-10-CM | POA: Diagnosis not present

## 2021-11-15 DIAGNOSIS — E782 Mixed hyperlipidemia: Secondary | ICD-10-CM | POA: Diagnosis not present

## 2021-12-12 ENCOUNTER — Other Ambulatory Visit: Payer: Self-pay

## 2021-12-12 NOTE — Patient Instructions (Signed)
Patient Goals/Self-Care Activities: Diabetes ?Take all medications as prescribed ?Attend all scheduled provider appointments ?check feet daily for cuts, sores or redness ?fill half of plate with vegetables ?limit fast food meals to no more than 1 per week ?manage portion size ?switch to sugar-free drinks ? ?

## 2021-12-12 NOTE — Patient Outreach (Signed)
[facility]) Care Management ? ?12/12/2021 ? ?Aaron Mendez ?18-Nov-1945 ?[id number] ? ? ?Telephone call to patient for disease management follow up. Patient reports doing good.  He does not have any reading today but reports he went to the doctor and sugar was checked.  He states the doctor told him it was good. Patient is not on medications for diabetes and is diet controlled. Education provided on diet control of diabetes.  Also discussed limiting sweets.  No concerns.   ? ?Care Plan : Diabetes Type 2 (Adult)  ?Updates made by [name], RN since 12/12/2021 12:00 AM  ?Completed 12/12/2021  ? ?Problem: Disease Progression (Diabetes, Type 2) Resolved 12/12/2021  ?  ? ?Long-Range Goal: Disease Progression Prevented or Minimized as evidenced by A1c less than 9 Completed 12/12/2021  ?Start Date: 09/12/2020  ?Expected End Date: 03/07/2022  ?Recent Progress: On track  ?Priority: High  ?Note:   ?Evidence-based guidance:  ? Promote self-monitoring of blood glucose levels. ? ?Notes: ?09/13/21- ?RN CM provided education on diabetic diet ?RN CM discussed importance of routine A1C testing  ?RN CM assessed for blood glucose monitoring and any barriers.  ?RN CM discussed importance of adhering to treatment plan. ?12/12/21 Duplicate goal resolving ?  ? ?Task: Monitor and Manage Follow-up for Comorbidities Completed 12/12/2021  ?Due Date: 03/07/2022  ?Priority: Routine  ?Responsible User: [name], RN  ?Note:   ?Care Management Activities:  ?  ?- healthy lifestyle promoted ?- reduction of sedentary activity encouraged ?- response to pharmacologic therapy monitored  ?  ?Notes: 12/07/20 Patient reports that sugars running 110-120. Patient has completely changed diet.  ?03/08/21 Patient reports doing well.  Blood sugar range continues 110-120.  Encouraged patient to continue and discussed importance of diabetes management ?Diabetes Management Discussed: ?Medication adherence ?Reviewed importance of limiting carbs such as rice,  potatoes, breads, and pastas. Also discussed limiting sweets and sugary drinks.  Discussed importance of portion control.  Also discussed importance of annual exams, foot exams, and eye exams.   ?06/14/21 Patient reports he is doing good. No longer on diabetic medication.  Blood sugars running around 120. Reiterated diabetes management.  Did have A1c done but her does not remember the number but states the doctor states he was good.  ? ?  ? ?Care Plan : RN CM Plan of Care  ?Updates made by [name], RN since 12/12/2021 12:00 AM  ?  ? ?Problem: Chronic Disease Management Care Coordination Needs Diabetes   ?Priority: High  ?  ? ?Long-Range Goal: Development of Plan of Care for Management of Diabetes   ?Start Date: 12/12/2021  ?Expected End Date: 09/07/2022  ?This Visit's Progress: On track  ?Priority: High  ?Note:   ?Current Barriers:  ?Chronic Disease Management support and education needs related to DMII  ? ?RNCM Clinical Goal(s):  ?Patient will verbalize basic understanding of  DMII disease process and self health management plan as evidenced by patient continuing to be off diabetes medication ?continue to work with RN Care Manager to address care management and care coordination needs related to  DMII as evidenced by adherence to CM Team Scheduled appointments through collaboration with RN Care manager, provider, and care team.  ? ?Interventions: ?Education and support for diet controlled diabetes ?Inter-disciplinary care team collaboration (see longitudinal plan of care) ?Evaluation of current treatment plan related to  self management and patient's adherence to plan as established by provider ? ? ?Diabetes Interventions:  (Status:  Goal on track:  Yes.) Long Term Goal ?  Assessed patient's understanding of A1c goal: <7% ?Provided education to patient about basic DM disease process ?Review of patient status, including review of consultants reports, relevant laboratory and other test results, and medications  completed ?Lab Results  ?Component Value Date  ? HGBA1C 8.3 (H) 10/29/2019  ? ?Patient Goals/Self-Care Activities: Diabetes ?Take all medications as prescribed ?Attend all scheduled provider appointments ?check feet daily for cuts, sores or redness ?fill half of plate with vegetables ?limit fast food meals to no more than 1 per week ?manage portion size ?switch to sugar-free drinks ? ?Follow Up Plan:  Telephone follow up appointment with care management team member scheduled for:  July ?The patient has been provided with contact information for the care management team and has been advised to call with any health related questions or concerns.  ? ?  ? ?Plan: Follow-up: Patient agrees to Care Plan and Follow-up. ?Follow-up in July.   ? ?[name], RN, MSN ?[facility] Care Management ?Care Management Coordinator ?Direct Line [phone] ?Toll Free: [phone]  ?Fax: [phone] ? ?

## 2022-01-22 DIAGNOSIS — L84 Corns and callosities: Secondary | ICD-10-CM | POA: Diagnosis not present

## 2022-01-22 DIAGNOSIS — I639 Cerebral infarction, unspecified: Secondary | ICD-10-CM | POA: Diagnosis not present

## 2022-01-22 DIAGNOSIS — B029 Zoster without complications: Secondary | ICD-10-CM | POA: Diagnosis not present

## 2022-01-22 DIAGNOSIS — B0229 Other postherpetic nervous system involvement: Secondary | ICD-10-CM | POA: Diagnosis not present

## 2022-01-22 DIAGNOSIS — I739 Peripheral vascular disease, unspecified: Secondary | ICD-10-CM | POA: Diagnosis not present

## 2022-02-12 ENCOUNTER — Telehealth: Payer: Self-pay

## 2022-02-12 NOTE — Telephone Encounter (Signed)
Contacted pt, informed him to discontinue the plavix and continue aspirin alone. He verbally understood.

## 2022-02-12 NOTE — Telephone Encounter (Signed)
We received a request from Dr Orland Dec office. They asked if we could reach out to the patient, either through an office visit or a phone call, to discuss the discontinuation of his Plavis in 2021. Dr Orland Dec office has been refilling the patients prescription as he has stated he was still needing to take it.   Per Shanda Bumps, ok to send to POD 3 to call the patient to discuss discontinuing the Plavix and continuing aspirin only.  Thanks!

## 2022-02-12 NOTE — Telephone Encounter (Addendum)
Per Shanda Bumps, Plavix is no longer needed. The prolong use of medication increase risk of bleed. Also, per last OV Jessica recommend Continue plavix until around May 19th and then stop and continue on aspirin alone. Will notify pt.

## 2022-02-20 ENCOUNTER — Other Ambulatory Visit: Payer: Self-pay

## 2022-02-20 NOTE — Patient Outreach (Signed)
[facility]) Care Management  02/20/2022  Zacchary Pompei 22-May-1946 [id number]   Telephone call to patient for disease management follow up.   No answer.  HIPAA compliant voice message left.    Plan: If no return call, RN CM will attempt patient again in the month of July.   [name], RN, MSN [facility] Care Management Care Management Coordinator Direct Line [phone] Toll Free: [phone]  Fax: [phone]

## 2022-03-12 ENCOUNTER — Other Ambulatory Visit: Payer: Self-pay

## 2022-03-12 NOTE — Patient Outreach (Signed)
[place] [facility]) Care Management  03/12/2022  Aaron Mendez 03-15-1946 [id number]   Telephone call to patient for follow up. Patient continues to do well and meeting goals.  Discussed case closure. Patient agreeable.  No concerns.    Care Plan : RN CM Plan of Care  Updates made by Jon Billings, RN since 03/12/2022 12:00 AM     Problem: Chronic Disease Management Care Coordination Needs Diabetes Resolved 03/12/2022  Priority: High     Long-Range Goal: Development of Plan of Care for Management of Diabetes Completed 03/12/2022  Start Date: 12/12/2021  Expected End Date: 09/07/2022  This Visit's Progress: On track  Recent Progress: On track  Priority: High  Note:   Current Barriers:  Chronic Disease Management support and education needs related to DMII   RNCM Clinical Goal(s):  Patient will verbalize basic understanding of  DMII disease process and self health management plan as evidenced by patient continuing to be off diabetes medication continue to work with RN Care Manager to address care management and care coordination needs related to  DMII as evidenced by adherence to CM Team Scheduled appointments through collaboration with RN Care manager, provider, and care team.   Interventions: Education and support for diet controlled diabetes Inter-disciplinary care team collaboration (see longitudinal plan of care) Evaluation of current treatment plan related to  self management and patient's adherence to plan as established by provider   Diabetes Interventions:  (Status:  Goal on track:  Yes.) Long Term Goal Assessed patient's understanding of A1c goal: <7% Provided education to patient about basic DM disease process Review of patient status, including review of consultants reports, relevant laboratory and other test results, and medications completed Lab Results  Component Value Date   HGBA1C 8.3 (H) 10/29/2019   Patient Goals/Self-Care Activities: Diabetes Take all  medications as prescribed Attend all scheduled provider appointments check feet daily for cuts, sores or redness fill half of plate with vegetables limit fast food meals to no more than 1 per week manage portion size switch to sugar-free drinks  Follow Up Plan: Patient met goals, will close case.     Plan: RN CM will close case.  Jone Baseman, RN, MSN [facility] Care Management Care Management Coordinator Direct Line [phone] Toll Free: [phone]  Fax: [phone];

## 2022-03-25 DIAGNOSIS — Z1331 Encounter for screening for depression: Secondary | ICD-10-CM | POA: Diagnosis not present

## 2022-03-25 DIAGNOSIS — Z0001 Encounter for general adult medical examination with abnormal findings: Secondary | ICD-10-CM | POA: Diagnosis not present

## 2022-03-25 DIAGNOSIS — R222 Localized swelling, mass and lump, trunk: Secondary | ICD-10-CM | POA: Diagnosis not present

## 2022-03-25 DIAGNOSIS — Z8673 Personal history of transient ischemic attack (TIA), and cerebral infarction without residual deficits: Secondary | ICD-10-CM | POA: Diagnosis not present

## 2022-03-25 DIAGNOSIS — E119 Type 2 diabetes mellitus without complications: Secondary | ICD-10-CM | POA: Diagnosis not present

## 2022-03-25 DIAGNOSIS — R413 Other amnesia: Secondary | ICD-10-CM | POA: Diagnosis not present

## 2022-03-25 DIAGNOSIS — Z1339 Encounter for screening examination for other mental health and behavioral disorders: Secondary | ICD-10-CM | POA: Diagnosis not present

## 2022-03-27 DIAGNOSIS — I10 Essential (primary) hypertension: Secondary | ICD-10-CM | POA: Diagnosis not present

## 2022-03-27 DIAGNOSIS — E782 Mixed hyperlipidemia: Secondary | ICD-10-CM | POA: Diagnosis not present

## 2022-03-27 DIAGNOSIS — N529 Male erectile dysfunction, unspecified: Secondary | ICD-10-CM | POA: Diagnosis not present

## 2022-04-01 DIAGNOSIS — R222 Localized swelling, mass and lump, trunk: Secondary | ICD-10-CM | POA: Diagnosis not present

## 2022-04-03 DIAGNOSIS — N529 Male erectile dysfunction, unspecified: Secondary | ICD-10-CM | POA: Diagnosis not present

## 2022-04-03 DIAGNOSIS — E782 Mixed hyperlipidemia: Secondary | ICD-10-CM | POA: Diagnosis not present

## 2022-04-03 DIAGNOSIS — I10 Essential (primary) hypertension: Secondary | ICD-10-CM | POA: Diagnosis not present

## 2022-04-03 DIAGNOSIS — Z23 Encounter for immunization: Secondary | ICD-10-CM | POA: Diagnosis not present

## 2022-06-09 DIAGNOSIS — I10 Essential (primary) hypertension: Secondary | ICD-10-CM | POA: Diagnosis not present

## 2022-06-09 DIAGNOSIS — Z23 Encounter for immunization: Secondary | ICD-10-CM | POA: Diagnosis not present

## 2022-06-09 DIAGNOSIS — L237 Allergic contact dermatitis due to plants, except food: Secondary | ICD-10-CM | POA: Diagnosis not present

## 2022-06-18 DIAGNOSIS — I10 Essential (primary) hypertension: Secondary | ICD-10-CM | POA: Diagnosis not present

## 2022-06-18 DIAGNOSIS — N3000 Acute cystitis without hematuria: Secondary | ICD-10-CM | POA: Diagnosis not present

## 2022-06-18 DIAGNOSIS — I639 Cerebral infarction, unspecified: Secondary | ICD-10-CM | POA: Diagnosis not present

## 2022-07-07 DIAGNOSIS — I10 Essential (primary) hypertension: Secondary | ICD-10-CM | POA: Diagnosis not present

## 2022-07-07 DIAGNOSIS — I639 Cerebral infarction, unspecified: Secondary | ICD-10-CM | POA: Diagnosis not present

## 2022-07-07 DIAGNOSIS — N529 Male erectile dysfunction, unspecified: Secondary | ICD-10-CM | POA: Diagnosis not present

## 2022-07-07 DIAGNOSIS — Z7185 Encounter for immunization safety counseling: Secondary | ICD-10-CM | POA: Diagnosis not present

## 2022-07-07 DIAGNOSIS — M545 Low back pain, unspecified: Secondary | ICD-10-CM | POA: Diagnosis not present

## 2022-07-07 DIAGNOSIS — I739 Peripheral vascular disease, unspecified: Secondary | ICD-10-CM | POA: Diagnosis not present

## 2022-10-02 DIAGNOSIS — I10 Essential (primary) hypertension: Secondary | ICD-10-CM | POA: Diagnosis not present

## 2022-10-02 DIAGNOSIS — B353 Tinea pedis: Secondary | ICD-10-CM | POA: Diagnosis not present

## 2022-10-02 DIAGNOSIS — E782 Mixed hyperlipidemia: Secondary | ICD-10-CM | POA: Diagnosis not present

## 2022-10-07 DIAGNOSIS — H40023 Open angle with borderline findings, high risk, bilateral: Secondary | ICD-10-CM | POA: Diagnosis not present

## 2022-10-07 DIAGNOSIS — H2513 Age-related nuclear cataract, bilateral: Secondary | ICD-10-CM | POA: Diagnosis not present

## 2022-10-07 DIAGNOSIS — H25013 Cortical age-related cataract, bilateral: Secondary | ICD-10-CM | POA: Diagnosis not present

## 2022-10-07 DIAGNOSIS — H35371 Puckering of macula, right eye: Secondary | ICD-10-CM | POA: Diagnosis not present

## 2022-10-16 DIAGNOSIS — H25812 Combined forms of age-related cataract, left eye: Secondary | ICD-10-CM | POA: Diagnosis not present

## 2022-10-16 DIAGNOSIS — H25813 Combined forms of age-related cataract, bilateral: Secondary | ICD-10-CM | POA: Diagnosis not present

## 2022-10-22 NOTE — Progress Notes (Signed)
This encounter was created in error - please disregard.

## 2022-11-12 DIAGNOSIS — H2522 Age-related cataract, morgagnian type, left eye: Secondary | ICD-10-CM | POA: Diagnosis not present

## 2022-11-12 DIAGNOSIS — H268 Other specified cataract: Secondary | ICD-10-CM | POA: Diagnosis not present

## 2022-11-12 DIAGNOSIS — H25812 Combined forms of age-related cataract, left eye: Secondary | ICD-10-CM | POA: Diagnosis not present

## 2022-12-15 DIAGNOSIS — H25811 Combined forms of age-related cataract, right eye: Secondary | ICD-10-CM | POA: Diagnosis not present

## 2022-12-20 ENCOUNTER — Emergency Department (HOSPITAL_COMMUNITY): Payer: Medicare HMO

## 2022-12-20 ENCOUNTER — Other Ambulatory Visit: Payer: Self-pay

## 2022-12-20 ENCOUNTER — Emergency Department (HOSPITAL_COMMUNITY)
Admission: EM | Admit: 2022-12-20 | Discharge: 2022-12-20 | Disposition: A | Discharge status: Home / Self Care | Payer: Medicare HMO | Attending: Student in an Organized Health Care Education/Training Program | Admitting: Student in an Organized Health Care Education/Training Program

## 2022-12-20 DIAGNOSIS — Z7982 Long term (current) use of aspirin: Secondary | ICD-10-CM | POA: Diagnosis not present

## 2022-12-20 DIAGNOSIS — W540XXA Bitten by dog, initial encounter: Secondary | ICD-10-CM | POA: Diagnosis not present

## 2022-12-20 DIAGNOSIS — Z203 Contact with and (suspected) exposure to rabies: Secondary | ICD-10-CM | POA: Insufficient documentation

## 2022-12-20 DIAGNOSIS — Z23 Encounter for immunization: Secondary | ICD-10-CM | POA: Diagnosis not present

## 2022-12-20 DIAGNOSIS — S81852A Open bite, left lower leg, initial encounter: Secondary | ICD-10-CM | POA: Insufficient documentation

## 2022-12-20 DIAGNOSIS — Z79899 Other long term (current) drug therapy: Secondary | ICD-10-CM | POA: Diagnosis not present

## 2022-12-20 DIAGNOSIS — Z7984 Long term (current) use of oral hypoglycemic drugs: Secondary | ICD-10-CM | POA: Insufficient documentation

## 2022-12-20 DIAGNOSIS — S81812A Laceration without foreign body, left lower leg, initial encounter: Secondary | ICD-10-CM | POA: Diagnosis not present

## 2022-12-20 DIAGNOSIS — Z2914 Encounter for prophylactic rabies immune globin: Secondary | ICD-10-CM | POA: Insufficient documentation

## 2022-12-20 MED ORDER — AMOXICILLIN-POT CLAVULANATE 875-125 MG PO TABS
1.0000 | ORAL_TABLET | Freq: Once | ORAL | Status: AC
  Administered 2022-12-20: 1 via ORAL
  Filled 2022-12-20: qty 1

## 2022-12-20 MED ORDER — RABIES IMMUNE GLOBULIN 150 UNIT/ML IM INJ: 20.0000 [IU]/kg | INJECTION | Freq: Once | INTRAMUSCULAR | Status: DC

## 2022-12-20 MED ORDER — RABIES IMMUNE GLOBULIN 150 UNIT/ML IM INJ
1500.0000 [IU] | INJECTION | Freq: Once | INTRAMUSCULAR | Status: AC
  Administered 2022-12-20: 1500 [IU]
  Filled 2022-12-20: qty 10

## 2022-12-20 MED ORDER — AMOXICILLIN-POT CLAVULANATE 875-125 MG PO TABS: 1.0000 | ORAL_TABLET | Freq: Two times a day (BID) | ORAL | 0 refills | Status: AC

## 2022-12-20 MED ORDER — RABIES VACCINE, PCEC IM SUSR
1.0000 mL | Freq: Once | INTRAMUSCULAR | Status: AC
  Administered 2022-12-20: 1 mL via INTRAMUSCULAR
  Filled 2022-12-20: qty 1

## 2022-12-20 MED ORDER — RABIES IMMUNE GLOBULIN 150 UNIT/ML IM INJ
225.0000 [IU] | INJECTION | Freq: Once | INTRAMUSCULAR | Status: AC
  Administered 2022-12-20: 225 [IU]
  Filled 2022-12-20: qty 2

## 2022-12-20 MED ORDER — LIDOCAINE HCL (PF) 1 % IJ SOLN
5.0000 mL | Freq: Once | INTRAMUSCULAR | Status: AC
  Administered 2022-12-20: 5 mL via SUBCUTANEOUS
  Filled 2022-12-20: qty 5

## 2022-12-20 NOTE — ED Triage Notes (Signed)
Pt with dog bite just PTA to [facility]. Bleeding controlled. States had tetanus shot within the last few months. Does know the dog that bit him but does not know if it is UTD on its shots.

## 2022-12-20 NOTE — ED Provider Notes (Signed)
[place] EMERGENCY DEPARTMENT AT [facility] Provider Note   CSN: [id number] Arrival date & time: 12/20/22  1148     History  Chief Complaint  Patient presents with   Animal Bite    Aaron Mendez is a 77 y.o. male.  77 year old male presenting to the emergency room for evaluation of a dog bite to his left calf that happened just prior to arrival.  Patient states he was walking his friend's dog when he got into a fight with another dog.  The patient attempted to break up this fight when the dog turned and bit him on the leg.  Patient does not think the dog is vaccinated.  He is unable to call the owner to check.  He states he had a tetanus shot within the last year.   Animal Bite      Home Medications Prior to Admission medications   Medication Sig Start Date End Date Taking? Authorizing Provider  amoxicillin-clavulanate (AUGMENTIN) 875-125 MG tablet Take 1 tablet by mouth every 12 (twelve) hours for 10 days. 12/20/22 12/30/22 Yes Steffie Waggoner, DO  Accu-Chek Softclix Lancets lancets Use to check glucose up to two times per day.  Dx: E11.9 11/09/19   Everrett Coombe, DO  aspirin EC 325 MG tablet TAKE 1 TABLET(325 MG) BY MOUTH DAILY 05/28/20   Everrett Coombe, DO  Blood Glucose Monitoring Suppl (ACCU-CHEK AVIVA PLUS) w/Device KIT Use to check glucose up to two times per day.  Dx: E11.9 11/09/19   Everrett Coombe, DO  clopidogrel (PLAVIX) 75 MG tablet TAKE 1 TABLET(75 MG) BY MOUTH DAILY 12/25/20   Everrett Coombe, DO  glucose blood (ACCU-CHEK AVIVA PLUS) test strip Use to check glucose up to two times per day.  Dx: E11.9 11/09/19   Everrett Coombe, DO  meloxicam (MOBIC) 7.5 MG tablet TAKE 2 TABLETS BY MOUTH DAILY AS NEEDED FOR PAIN. TAKE ONCE YOU'VE COMPLETED STEROID PACK 11/23/19   Cristie Hem, PA-C  metFORMIN (GLUCOPHAGE) 500 MG tablet TAKE 1 TABLET(500 MG) BY MOUTH DAILY WITH BREAKFAST 08/06/20   Everrett Coombe, DO  methocarbamol (ROBAXIN) 500 MG tablet Take 1 tablet (500 mg total)  by mouth 2 (two) times daily as needed. 04/10/20   Cristie Hem, PA-C  methylPREDNISolone (MEDROL DOSEPAK) 4 MG TBPK tablet Take as directed 04/10/20   Cristie Hem, PA-C  Multiple Vitamin (MULTIVITAMIN WITH MINERALS) TABS tablet Take 1 tablet by mouth daily.    [provider]  rosuvastatin (CRESTOR) 10 MG tablet TAKE 1 TABLET(10 MG) BY MOUTH DAILY 12/25/20   Everrett Coombe, DO  traMADol (ULTRAM) 50 MG tablet Take 1 tablet (50 mg total) by mouth 3 (three) times daily as needed. 08/14/20   Cristie Hem, PA-C      Allergies    Patient has no known allergies.    Review of Systems   Review of Systems  Skin:  Positive for wound.  All other systems reviewed and are negative.   Physical Exam Updated Vital Signs BP (!) 154/90 (BP Location: Right Arm)   Pulse 79   Temp 97.7 F (36.5 C) (Oral)   Resp 17   Wt 84.4 kg   SpO2 93%   BMI 26.93 kg/m  Physical Exam Vitals and nursing note reviewed.  Constitutional:      General: He is not in acute distress.    Appearance: He is well-developed.  HENT:     Head: Normocephalic and atraumatic.  Eyes:     Conjunctiva/sclera: Conjunctivae normal.  Cardiovascular:     Rate and Rhythm: Normal rate and regular rhythm.     Heart sounds: No murmur heard. Pulmonary:     Effort: Pulmonary effort is normal. No respiratory distress.     Breath sounds: Normal breath sounds.  Abdominal:     Palpations: Abdomen is soft.     Tenderness: There is no abdominal tenderness.  Musculoskeletal:        General: No swelling.     Cervical back: Neck supple.  Skin:    General: Skin is warm and dry.     Capillary Refill: Capillary refill takes less than 2 seconds.     Comments: Left lower extremity has a 3 cm laceration to the calf with 2 puncture wounds surrounding it.  Mild crepitus noted in the calf.  Neurological:     Mental Status: He is alert.  Psychiatric:        Mood and Affect: Mood normal.     ED Results / Procedures / Treatments    Labs (all labs ordered are listed, but only abnormal results are displayed) Labs Reviewed - No data to display  EKG None  Radiology DG Tibia/Fibula Left  Result Date: 12/20/2022 CLINICAL DATA:  Dog bite. EXAM: LEFT TIBIA AND FIBULA - 2 VIEW COMPARISON:  July 17, 2020. FINDINGS: There is no evidence of fracture or other focal bone lesions. Vascular calcifications are noted. No radiopaque foreign body is noted. Soft tissue gas is noted laterally in the proximal calf consistent with history of dog bite. IMPRESSION: Soft tissue gas is noted consistent with history of dog bite. No fracture or bony abnormality is noted. Electronically Signed   By: Lupita Raider M.D.   On: 12/20/2022 13:39    Procedures .Marland KitchenLaceration Repair  Date/Time: 12/20/2022 2:15 PM  Performed by: Leanda Padmore, DO Authorized by: Barabara Motz, DO   Consent:    Consent obtained:  Verbal   Consent given by:  Patient   Risks, benefits, and alternatives were discussed: yes     Risks discussed:  Infection and pain   Alternatives discussed:  No treatment Universal protocol:    Procedure explained and questions answered to patient or proxy's satisfaction: yes     Patient identity confirmed:  Verbally with patient Anesthesia:    Anesthesia method:  Local infiltration   Local anesthetic:  Lidocaine 1% w/o epi Laceration details:    Location:  Leg   Leg location:  L lower leg   Length (cm):  3 Exploration:    Limited defect created (wound extended): yes     Imaging obtained: x-ray     Imaging outcome: foreign body not noted     Wound exploration: entire depth of wound visualized     Contaminated: no   Treatment:    Area cleansed with:  Saline   Amount of cleaning:  Extensive   Irrigation solution:  Sterile saline   Irrigation volume:  1500   Irrigation method:  Pressure wash and syringe   Visualized foreign bodies/material removed: no     Debridement:  None   Undermining:  None Skin repair:    Repair  method:  Sutures   Suture size:  4-0   Suture material:  Chromic gut   Suture technique:  Simple interrupted   Number of sutures:  2 Approximation:    Approximation:  Loose Repair type:    Repair type:  Simple Post-procedure details:    Dressing:  Open (no dressing)   Procedure completion:  Tolerated  Comments:     3 cm laceration was loosely approximated     Medications Ordered in ED Medications  rabies vaccine (RABAVERT) injection 1 mL (has no administration in time range)  rabies immune globulin (HYPERRAB/KEDRAB) injection 1,500 Units (has no administration in time range)    And  rabies immune globulin (HYPERRAB/KEDRAB) injection 225 Units (has no administration in time range)  amoxicillin-clavulanate (AUGMENTIN) 875-125 MG per tablet 1 tablet (1 tablet Oral Given 12/20/22 1411)  lidocaine (PF) (XYLOCAINE) 1 % injection 5 mL (5 mLs Subcutaneous Given by Other 12/20/22 1411)    ED Course/ Medical Decision Making/ A&P                             Medical Decision Making 77 year old male presenting for evaluation after a dog bite to the left calf.  The wound was extensively irrigated with normal saline.  He has 2 puncture marks with a larger 3 cm laceration.  Patient requesting rabies vaccine since he is unsure if the dog is vaccinated. We have left the 2 puncture wounds open so they can continue to drain after they were irrigated.  The 3 cm laceration was mildly gaping, so it was loosely approximated with 2 sutures.  The wound is still draining as expected.  No active bleeding noted.  X-ray did show some air within the soft tissues of the leg that correlates with a dog bite.  Patient given first dose of antibiotic here in the ED.  We will send home with a course of Augmentin.  He confirmed that his tetanus is up-to-date.  However, since we cannot confirm the dog's vaccine records then we proceeded with rabies vaccine.  Patient's 2 daughters were at bedside and their questions were  answered.  Return precautions given and rabies vaccine information given.  Amount and/or Complexity of Data Reviewed Radiology: ordered.  Risk Prescription drug management.    Final Clinical Impression(s) / ED Diagnoses Final diagnoses:  Dog bite of left lower leg, initial encounter    Rx / DC Orders ED Discharge Orders          Ordered    amoxicillin-clavulanate (AUGMENTIN) 875-125 MG tablet  Every 12 hours        12/20/22 1441              Jacorian Golaszewski, DO 12/20/22 1450

## 2022-12-20 NOTE — Discharge Instructions (Addendum)
You were seen in the emergency room today for treatment after your dog bite.  Allow the wounds to continue draining.  You will need to follow-up and receive the rest of your rabies vaccine course.  Please follow-up as indicated for your next doses.  I have sent an antibiotic to the pharmacy-please take this as prescribed.  You will also need to have your sutures removed in 7-10 days.

## 2022-12-26 ENCOUNTER — Telehealth: Payer: Self-pay

## 2022-12-26 DIAGNOSIS — Z8673 Personal history of transient ischemic attack (TIA), and cerebral infarction without residual deficits: Secondary | ICD-10-CM | POA: Diagnosis not present

## 2022-12-26 DIAGNOSIS — W540XXA Bitten by dog, initial encounter: Secondary | ICD-10-CM | POA: Diagnosis not present

## 2022-12-26 DIAGNOSIS — S81859A Open bite, unspecified lower leg, initial encounter: Secondary | ICD-10-CM | POA: Diagnosis not present

## 2022-12-26 NOTE — Telephone Encounter (Signed)
     Patient  visit on 12/20/2022  at [facility] was for animal bite.  Have you been able to follow up with your primary care physician? Yes  The patient was or was not able to obtain any needed medicine or equipment. Patient was able to obtain medication.  Are there diet recommendations that you are having difficulty following? No  Patient expresses understanding of discharge instructions and education provided has no other needs at this time. Yes  Verified patient's mailing address to send utility assistance information.  [facility]  [facility] Population Health Community Resource Care Guide   [email or web address]  ?? [phone]   Website: [email or web address]

## 2022-12-31 DIAGNOSIS — Z8673 Personal history of transient ischemic attack (TIA), and cerebral infarction without residual deficits: Secondary | ICD-10-CM | POA: Diagnosis not present

## 2022-12-31 DIAGNOSIS — W540XXA Bitten by dog, initial encounter: Secondary | ICD-10-CM | POA: Diagnosis not present

## 2022-12-31 DIAGNOSIS — S81859A Open bite, unspecified lower leg, initial encounter: Secondary | ICD-10-CM | POA: Diagnosis not present

## 2023-01-01 ENCOUNTER — Telehealth: Payer: Self-pay | Admitting: *Deleted

## 2023-01-01 ENCOUNTER — Ambulatory Visit (HOSPITAL_COMMUNITY)
Admission: EM | Admit: 2023-01-01 | Discharge: 2023-01-01 | Disposition: A | Discharge status: Home / Self Care | Payer: Medicare HMO | Attending: Emergency Medicine | Admitting: Emergency Medicine

## 2023-01-01 DIAGNOSIS — Z203 Contact with and (suspected) exposure to rabies: Secondary | ICD-10-CM | POA: Diagnosis not present

## 2023-01-01 DIAGNOSIS — Z23 Encounter for immunization: Secondary | ICD-10-CM

## 2023-01-01 MED ORDER — RABIES VACCINE, PCEC IM SUSR
1.0000 mL | Freq: Once | INTRAMUSCULAR | Status: AC
  Administered 2023-01-01: 1 mL via INTRAMUSCULAR

## 2023-01-01 MED ORDER — RABIES VACCINE, PCEC IM SUSR
INTRAMUSCULAR | Status: AC
  Filled 2023-01-01: qty 1

## 2023-01-01 NOTE — Telephone Encounter (Signed)
Pt father misplaced discharge papers; requesting a copy.

## 2023-01-01 NOTE — ED Provider Notes (Signed)
Had dog bite to left calf on 4/13. Was seen in the ED. Had Kedrab infiltration and received first dose of Rabivert. However he was not given instructions on when to return for Rabivert series, so did not get any further vaccines.   We discussed since it has been 2 weeks since first vaccine, will need to restart the Rabivert series with today being day 0.  Vaccine given in clinic.   Will need to return on: Day 3 - 4/28 Day 7 - 5/2 Day 14 - 5/9  Information was provided to patient and went over the vaccine schedule. Understands he can return here for the shots.   Brandyn Thien, Lurena Joiner, PA-C 01/01/23 1022

## 2023-01-01 NOTE — ED Notes (Signed)
Per providers Marlow Baars, PA pt needs to restart rabies series since it has been 12 days since his initial vaccine. Pt aware and verbalized understanding. I have re done the vaccine letter with correct dates and he stopped at front desk to make an appt time for the next three vaccine dates.

## 2023-01-02 DIAGNOSIS — S81852A Open bite, left lower leg, initial encounter: Secondary | ICD-10-CM | POA: Diagnosis not present

## 2023-01-02 DIAGNOSIS — W540XXA Bitten by dog, initial encounter: Secondary | ICD-10-CM | POA: Diagnosis not present

## 2023-01-04 ENCOUNTER — Ambulatory Visit (HOSPITAL_COMMUNITY)
Admission: EM | Admit: 2023-01-04 | Discharge: 2023-01-04 | Disposition: A | Discharge status: Home / Self Care | Payer: Medicare HMO | Attending: Internal Medicine | Admitting: Internal Medicine

## 2023-01-04 DIAGNOSIS — Z23 Encounter for immunization: Secondary | ICD-10-CM | POA: Diagnosis not present

## 2023-01-04 DIAGNOSIS — Z203 Contact with and (suspected) exposure to rabies: Secondary | ICD-10-CM

## 2023-01-04 MED ORDER — RABIES VACCINE, PCEC IM SUSR
1.0000 mL | Freq: Once | INTRAMUSCULAR | Status: AC
  Administered 2023-01-04: 1 mL via INTRAMUSCULAR

## 2023-01-04 MED ORDER — RABIES VACCINE, PCEC IM SUSR
INTRAMUSCULAR | Status: AC
  Filled 2023-01-04: qty 1

## 2023-01-04 NOTE — ED Notes (Signed)
Patient given day 3 rabies vaccine in the left deltoid and tolerated well.

## 2023-01-04 NOTE — ED Triage Notes (Signed)
Patient presenting for day 3 rabies vaccine. No reaction to previous injection.  

## 2023-01-08 ENCOUNTER — Ambulatory Visit (HOSPITAL_COMMUNITY)
Admission: EM | Admit: 2023-01-08 | Discharge: 2023-01-08 | Disposition: A | Discharge status: Home / Self Care | Payer: Medicare HMO | Attending: Nurse Practitioner | Admitting: Nurse Practitioner

## 2023-01-08 ENCOUNTER — Encounter (HOSPITAL_COMMUNITY): Payer: Self-pay

## 2023-01-08 VITALS — BP 129/69 | HR 86 | Temp 97.9°F | Resp 16

## 2023-01-08 DIAGNOSIS — L03116 Cellulitis of left lower limb: Secondary | ICD-10-CM

## 2023-01-08 DIAGNOSIS — Z23 Encounter for immunization: Secondary | ICD-10-CM | POA: Diagnosis not present

## 2023-01-08 DIAGNOSIS — L255 Unspecified contact dermatitis due to plants, except food: Secondary | ICD-10-CM | POA: Diagnosis not present

## 2023-01-08 DIAGNOSIS — Z203 Contact with and (suspected) exposure to rabies: Secondary | ICD-10-CM

## 2023-01-08 MED ORDER — PREDNISONE 10 MG (21) PO TBPK: ORAL_TABLET | ORAL | 0 refills | Status: AC

## 2023-01-08 MED ORDER — TRIAMCINOLONE ACETONIDE 0.1 % EX OINT: 1.0000 | TOPICAL_OINTMENT | Freq: Two times a day (BID) | CUTANEOUS | 0 refills | Status: DC

## 2023-01-08 MED ORDER — AMOXICILLIN-POT CLAVULANATE 875-125 MG PO TABS: 1.0000 | ORAL_TABLET | Freq: Two times a day (BID) | ORAL | 0 refills | Status: AC

## 2023-01-08 MED ORDER — RABIES VACCINE, PCEC IM SUSR
1.0000 mL | Freq: Once | INTRAMUSCULAR | Status: AC
  Administered 2023-01-08: 1 mL via INTRAMUSCULAR

## 2023-01-08 MED ORDER — RABIES VACCINE, PCEC IM SUSR
INTRAMUSCULAR | Status: AC
  Filled 2023-01-08: qty 1

## 2023-01-08 NOTE — ED Provider Notes (Signed)
[facility]    CSN: [id number] Arrival date & time: 01/08/23  6270      History   Chief Complaint Chief Complaint  Patient presents with   Leg Swelling    HPI Aaron Mendez is a 77 y.o. male.   Patient presents today for third rabies vaccine.  Reports he is concerned because he is having swelling and redness to the left leg where the dog bite is healing at.  Denies pain in the left lower extremity.  Reports he took the Augmentin as prescribed and finished a few days ago.  He denies fevers, nausea/vomiting, body aches or chills.  No drainage from the wound.  Reports the left leg does feel warmer than the other.  Patient also endorses bilateral rash to lower extremities.  Reports he has been exposed to poison oak and has been using calamine lotion as well as Neosporin which does seem to be helping some.  Reports he is concerned because he is itching his legs and he does not want to infect the wound.  Reports that rash started out on his arms as well, however this has not resolved.  Patient has history of diabetes-reports blood sugars have been in the 120s fasting which is normal for him.  No chest pain or shortness of breath.    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 No pertinent surgical history.     Home Medications    Prior to Admission medications   Medication Sig Start Date End Date Taking? Authorizing Provider  amoxicillin-clavulanate (AUGMENTIN) 875-125 MG tablet Take 1 tablet by mouth 2 (two) times daily for 7 days. 01/08/23 01/15/23 Yes Valentino Nose, NP  predniSONE (STERAPRED UNI-PAK 21 TAB) 10 MG (21) TBPK tablet Take 6 tablets (60 mg total) by mouth daily for 1 day, THEN 5 tablets (50 mg total) daily for 1 day, THEN 4 tablets (40 mg total) daily for 1 day, THEN 3 tablets (30 mg total) daily for 1 day, THEN 2 tablets (20 mg total) daily for 1 day, THEN 1 tablet (10 mg total) daily for 1 day. 01/08/23 01/14/23 Yes Cathlean Marseilles A, NP  triamcinolone ointment (KENALOG) 0.1 % Apply 1 Application topically 2 (two) times daily. Apply twice daily to clean nonbroken skin as needed for itching 01/08/23  Yes Cathlean Marseilles A, NP  Accu-Chek Softclix Lancets lancets Use to check glucose up to two times per day.  Dx: E11.9 11/09/19   Everrett Coombe, DO  aspirin EC 325 MG tablet TAKE 1 TABLET(325 MG) BY MOUTH DAILY 05/28/20   Everrett Coombe, DO  Blood Glucose Monitoring Suppl (ACCU-CHEK AVIVA PLUS) w/Device KIT Use to check glucose up to two times per day.  Dx: E11.9 11/09/19  Everrett Coombe, DO  clopidogrel (PLAVIX) 75 MG tablet TAKE 1 TABLET(75 MG) BY MOUTH DAILY 12/25/20   Everrett Coombe, DO  glucose blood (ACCU-CHEK AVIVA PLUS) test strip Use to check glucose up to two times per day.  Dx: E11.9 11/09/19   Everrett Coombe, DO  meloxicam (MOBIC) 7.5 MG tablet TAKE 2 TABLETS BY MOUTH DAILY AS NEEDED FOR PAIN. TAKE ONCE YOU'VE COMPLETED STEROID PACK 11/23/19   Cristie Hem, PA-C  metFORMIN (GLUCOPHAGE) 500 MG tablet TAKE 1 TABLET(500 MG) BY MOUTH DAILY WITH BREAKFAST 08/06/20   Everrett Coombe, DO  methocarbamol (ROBAXIN) 500 MG tablet Take 1 tablet (500 mg total) by mouth 2 (two) times  daily as needed. 04/10/20   Cristie Hem, PA-C  Multiple Vitamin (MULTIVITAMIN WITH MINERALS) TABS tablet Take 1 tablet by mouth daily.    [provider]  rosuvastatin (CRESTOR) 10 MG tablet TAKE 1 TABLET(10 MG) BY MOUTH DAILY 12/25/20   Everrett Coombe, DO  traMADol (ULTRAM) 50 MG tablet Take 1 tablet (50 mg total) by mouth 3 (three) times daily as needed. 08/14/20   Cristie Hem, PA-C    Family History Family History  Problem Relation Age of Onset   Sickle cell anemia Son     Social History Social History   Tobacco Use   Smoking status: Never   Smokeless tobacco: Never  Substance Use Topics   Alcohol use: No   Drug use: No     Allergies   Patient has no known allergies.   Review of Systems Review of Systems Per HPI  Physical Exam Triage Vital Signs ED Triage Vitals  Enc Vitals Group     BP 01/08/23 0909 129/69     Pulse Rate 01/08/23 0909 86     Resp 01/08/23 0909 16     Temp 01/08/23 0909 97.9 F (36.6 C)     Temp Source 01/08/23 0909 Oral     SpO2 01/08/23 0909 93 %     Weight --      Height --      Head Circumference --      Peak Flow --      Pain Score 01/08/23 0910 0     Pain Loc --      Pain Edu? --      Excl. in GC? --    No data found.  Updated Vital Signs BP 129/69 (BP Location: Left Arm)   Pulse 86   Temp 97.9 F (36.6 C) (Oral)   Resp 16   SpO2 93%   Visual Acuity Right Eye Distance:   Left Eye Distance:   Bilateral Distance:    Right Eye Near:   Left Eye Near:    Bilateral Near:     Physical Exam Vitals and nursing note reviewed.  Constitutional:      General: He is not in acute distress.    Appearance: Normal appearance. He is not toxic-appearing.  HENT:     Head: Normocephalic and atraumatic.     Mouth/Throat:     Mouth: Mucous membranes are moist.     Pharynx: Oropharynx is clear.  Pulmonary:     Effort: Pulmonary effort is normal. No respiratory distress.  Musculoskeletal:     Cervical back: Normal  range of motion.  Lymphadenopathy:     Cervical: No cervical adenopathy.  Skin:    General: Skin is warm and dry.     Capillary Refill: Capillary refill takes less than 2 seconds.  Coloration: Skin is not jaundiced or pale.     Findings: Erythema present.          Comments: Erythema, mild edema, and warmth noted to left lower extremity diffusely.  Nontender to touch.  Dog bite wound appears to be in healing stages-no active drainage.  There is a flesh-colored maculopapular rash to bilateral lower extremities.  No oozing or surrounding erythema, warmth, active drainage.  Neurological:     Mental Status: He is alert and oriented to person, place, and time.  Psychiatric:        Behavior: Behavior is cooperative.      UC Treatments / Results  Labs (all labs ordered are listed, but only abnormal results are displayed) Labs Reviewed - No data to display  EKG   Radiology No results found.  Procedures Procedures (including critical care time)  Medications Ordered in UC Medications  rabies vaccine (RABAVERT) injection 1 mL (1 mL Intramuscular Given 01/08/23 0928)    Initial Impression / Assessment and Plan / UC Course  I have reviewed the triage vital signs and the nursing notes.  Pertinent labs & imaging results that were available during my care of the patient were reviewed by me and considered in my medical decision making (see chart for details).   Patient is well-appearing, normotensive, afebrile, not tachycardic, not tachypneic, oxygenating well on room air.    1. Encounter for repeat administration of rabies vaccination Third rabies vaccine given today Plan to return in 1 week for final rabies vaccine  2. Cellulitis of left lower extremity Suspect secondary to dog bite No red flags in history or on exam; vital signs and exam are reassuring Treat with Augmentin twice daily for 7 days Supportive care and return/ER precautions discussed with patient  3. Contact  dermatitis due to plants, except food, unspecified contact dermatitis type Treat with topical triamcinolone ointment twice daily Start oral prednisone taper tomorrow-discussed that this will likely elevate blood sugars temporarily ER and return precautions discussed  The patient was given the opportunity to ask questions.  All questions answered to their satisfaction.  The patient is in agreement to this plan.    Final Clinical Impressions(s) / UC Diagnoses   Final diagnoses:  Encounter for repeat administration of rabies vaccination  Cellulitis of left lower extremity  Contact dermatitis due to plants, except food, unspecified contact dermatitis type     Discharge Instructions      We have given you the third rabies shot today; Please return for your last rabies shot on 5/9  Start the Augmentin to treat infection in your left leg   Start using the steroid ointment to help with the rash on your legs; avoid getting this anywhere near the dog bite  Start the oral prednisone to help clear up the rash; keep a close check of your blood sugars as they will likely be elevated while on the prednisone.  This should improve when the prednisone is done.      ED Prescriptions     Medication Sig Dispense Auth. Provider   amoxicillin-clavulanate (AUGMENTIN) 875-125 MG tablet Take 1 tablet by mouth 2 (two) times daily for 7 days. 14 tablet Cathlean Marseilles A, NP   predniSONE (STERAPRED UNI-PAK 21 TAB) 10 MG (21) TBPK tablet Take 6 tablets (60 mg total) by mouth daily for 1 day, THEN 5 tablets (50 mg total) daily for 1 day, THEN 4 tablets (40 mg total) daily for 1 day, THEN 3 tablets (30 mg total) daily  for 1 day, THEN 2 tablets (20 mg total) daily for 1 day, THEN 1 tablet (10 mg total) daily for 1 day. 21 tablet Cathlean Marseilles A, NP   triamcinolone ointment (KENALOG) 0.1 % Apply 1 Application topically 2 (two) times daily. Apply twice daily to clean nonbroken skin as needed for itching 30 g  Valentino Nose, NP      PDMP not reviewed this encounter.   Valentino Nose, NP 01/08/23 1201

## 2023-01-08 NOTE — Discharge Instructions (Addendum)
We have given you the third rabies shot today; Please return for your last rabies shot on 5/9  Start the Augmentin to treat infection in your left leg   Start using the steroid ointment to help with the rash on your legs; avoid getting this anywhere near the dog bite  Start the oral prednisone to help clear up the rash; keep a close check of your blood sugars as they will likely be elevated while on the prednisone.  This should improve when the prednisone is done.

## 2023-01-08 NOTE — ED Triage Notes (Signed)
Pt states he is here for his 4th rabies shot.  Pt also having redness and swelling to left leg where he was bit.  Pt states he finished the antibiotics he was given.  Left lower extremity red and swollen with a healing wound.

## 2023-01-15 ENCOUNTER — Encounter (HOSPITAL_COMMUNITY): Payer: Self-pay

## 2023-01-15 ENCOUNTER — Ambulatory Visit (HOSPITAL_COMMUNITY)
Admission: EM | Admit: 2023-01-15 | Discharge: 2023-01-15 | Disposition: A | Discharge status: Home / Self Care | Payer: Medicare HMO | Attending: Emergency Medicine | Admitting: Emergency Medicine

## 2023-01-15 DIAGNOSIS — Z203 Contact with and (suspected) exposure to rabies: Secondary | ICD-10-CM | POA: Diagnosis not present

## 2023-01-15 DIAGNOSIS — Z23 Encounter for immunization: Secondary | ICD-10-CM

## 2023-01-15 MED ORDER — RABIES VACCINE, PCEC IM SUSR
1.0000 mL | Freq: Once | INTRAMUSCULAR | Status: AC
  Administered 2023-01-15: 1 mL via INTRAMUSCULAR

## 2023-01-15 MED ORDER — RABIES VACCINE, PCEC IM SUSR
INTRAMUSCULAR | Status: AC
  Filled 2023-01-15: qty 1

## 2023-01-15 NOTE — ED Triage Notes (Signed)
Pt here to get last rabies vaccination. Reports leg is healing good and has no concerns. Denies needing provider to check spot on LLE.

## 2023-01-16 ENCOUNTER — Ambulatory Visit (HOSPITAL_COMMUNITY)
Admission: EM | Admit: 2023-01-16 | Discharge: 2023-01-16 | Disposition: A | Discharge status: Home / Self Care | Payer: Medicare HMO | Attending: Emergency Medicine | Admitting: Emergency Medicine

## 2023-01-16 ENCOUNTER — Encounter (HOSPITAL_COMMUNITY): Payer: Self-pay

## 2023-01-16 DIAGNOSIS — W540XXA Bitten by dog, initial encounter: Secondary | ICD-10-CM | POA: Diagnosis not present

## 2023-01-16 DIAGNOSIS — S81852D Open bite, left lower leg, subsequent encounter: Secondary | ICD-10-CM | POA: Diagnosis not present

## 2023-01-16 DIAGNOSIS — D573 Sickle-cell trait: Secondary | ICD-10-CM | POA: Diagnosis not present

## 2023-01-16 DIAGNOSIS — L089 Local infection of the skin and subcutaneous tissue, unspecified: Secondary | ICD-10-CM

## 2023-01-16 DIAGNOSIS — W540XXD Bitten by dog, subsequent encounter: Secondary | ICD-10-CM

## 2023-01-16 DIAGNOSIS — S81852A Open bite, left lower leg, initial encounter: Secondary | ICD-10-CM | POA: Diagnosis not present

## 2023-01-16 DIAGNOSIS — E119 Type 2 diabetes mellitus without complications: Secondary | ICD-10-CM | POA: Diagnosis not present

## 2023-01-16 DIAGNOSIS — L02416 Cutaneous abscess of left lower limb: Secondary | ICD-10-CM | POA: Diagnosis not present

## 2023-01-16 MED ORDER — MUPIROCIN CALCIUM 2 % EX CREA: 1.0000 | TOPICAL_CREAM | Freq: Two times a day (BID) | CUTANEOUS | 0 refills | Status: AC

## 2023-01-16 NOTE — Discharge Instructions (Signed)
Overall your wound is healing well.  It is without warmth, tenderness, abscess or fluctuance. There is no area that needs drainage. Please continue to keep the wound bed clean and dry.  You can apply the antibacterial Bactroban ointment twice daily to the area.  I do not believe you need any more antibiotics, your cellulitis has resolved.  Please return to clinic if you develop fever, warmth, purulent drainage, or any new concerning symptoms for infection.

## 2023-01-16 NOTE — ED Triage Notes (Signed)
Pt present a enlarge boil on his left leg and painful to the touch. Pt states three weeks ago he dog bite him on his leg. Pt has received all rabies vaccine. Pt PCP sent him over her to have his leg drained.

## 2023-01-16 NOTE — ED Provider Notes (Signed)
[facility]    CSN: [id number] Arrival date & time: 01/16/23  1525      History   Chief Complaint Chief Complaint  Patient presents with   Recurrent Skin Infections    HPI Aaron Mendez is a 77 y.o. male.   Patient presents to clinic for wound recheck.  Reports he went to his primary care, Dr. Maryelizabeth Rowan, earlier today and she recommended he come to the urgent care for potential incision and drainage of his left leg wound from a dog bite.  Patiently recently finished Augmentin.  Has been keeping his wound open, clean and dry.  He denies fevers, wound drainage or warmth or tenderness. Reports his swelling has improved.   The history is provided by the patient and medical records.    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 No pertinent surgical history.     Home Medications    Prior to Admission medications   Medication Sig Start Date End Date Taking? Authorizing Provider  mupirocin cream (BACTROBAN) 2 % Apply 1 Application topically 2 (two) times daily. 01/16/23  Yes Rinaldo Ratel, [place], FNP  Accu-Chek Softclix Lancets lancets Use to check glucose up to two times per day.  Dx:  E11.9 11/09/19   Everrett Coombe, DO  aspirin EC 325 MG tablet TAKE 1 TABLET(325 MG) BY MOUTH DAILY 05/28/20   Everrett Coombe, DO  Blood Glucose Monitoring Suppl (ACCU-CHEK AVIVA PLUS) w/Device KIT Use to check glucose up to two times per day.  Dx: E11.9 11/09/19   Everrett Coombe, DO  clopidogrel (PLAVIX) 75 MG tablet TAKE 1 TABLET(75 MG) BY MOUTH DAILY 12/25/20   Everrett Coombe, DO  glucose blood (ACCU-CHEK AVIVA PLUS) test strip Use to check glucose up to two times per day.  Dx: E11.9 11/09/19   Everrett Coombe, DO  meloxicam (MOBIC) 7.5 MG tablet TAKE 2 TABLETS BY MOUTH DAILY AS NEEDED FOR PAIN. TAKE ONCE YOU'VE COMPLETED STEROID PACK 11/23/19   Cristie Hem, PA-C  metFORMIN (GLUCOPHAGE) 500 MG tablet TAKE 1 TABLET(500 MG) BY MOUTH DAILY WITH BREAKFAST 08/06/20   Everrett Coombe, DO  methocarbamol (ROBAXIN) 500 MG tablet Take 1 tablet (500 mg total) by mouth 2 (two) times daily as needed. 04/10/20   Cristie Hem, PA-C  Multiple Vitamin (MULTIVITAMIN WITH MINERALS) TABS tablet Take 1 tablet by mouth daily.    [provider]  rosuvastatin (CRESTOR) 10 MG tablet TAKE 1 TABLET(10 MG) BY MOUTH DAILY 12/25/20   Everrett Coombe, DO  traMADol (ULTRAM) 50 MG tablet Take 1 tablet (50 mg total) by mouth 3 (three) times daily as needed. 08/14/20   Cristie Hem, PA-C  triamcinolone ointment (KENALOG) 0.1 % Apply 1 Application topically 2 (two)  times daily. Apply twice daily to clean nonbroken skin as needed for itching 01/08/23   Valentino Nose, NP    Family History Family History  Problem Relation Age of Onset   Sickle cell anemia Son     Social History Social History   Tobacco Use   Smoking status: Never   Smokeless tobacco: Never  Substance Use Topics   Alcohol use: No   Drug use: No     Allergies   Patient has no known allergies.   Review of Systems Review of Systems  Constitutional:  Negative for fatigue and fever.  Skin:  Positive for wound.     Physical Exam Triage  Vital Signs ED Triage Vitals  Enc Vitals Group     BP 01/16/23 1601 129/79     Pulse Rate 01/16/23 1601 (!) 57     Resp 01/16/23 1601 18     Temp 01/16/23 1601 98.4 F (36.9 C)     Temp Source 01/16/23 1601 Oral     SpO2 01/16/23 1601 95 %     Weight --      Height --      Head Circumference --      Peak Flow --      Pain Score 01/16/23 1600 0     Pain Loc --      Pain Edu? --      Excl. in GC? --    No data found.  Updated Vital Signs BP 129/79 (BP Location: Right Arm)   Pulse (!) 57   Temp 98.4 F (36.9 C) (Oral)   Resp 18   SpO2 95%   Visual Acuity Right Eye Distance:   Left Eye Distance:   Bilateral Distance:    Right Eye Near:   Left Eye Near:    Bilateral Near:     Physical Exam Vitals and nursing note reviewed.  Constitutional:      Appearance: Normal appearance.  HENT:     Head: Normocephalic and atraumatic.     Right Ear: External ear normal.     Left Ear: External ear normal.     Nose: Nose normal.     Mouth/Throat:     Mouth: Mucous membranes are moist.  Eyes:     Conjunctiva/sclera: Conjunctivae normal.  Cardiovascular:     Rate and Rhythm: Normal rate.  Pulmonary:     Effort: Pulmonary effort is normal. No respiratory distress.  Musculoskeletal:        General: Swelling and signs of injury present. No tenderness or deformity. Normal range of motion.  Skin:    General: Skin is warm and dry.     Findings: Wound present.          Comments: Healing dog bite to left calf.  Media uploaded into chart.  Neurological:     General: No focal deficit present.     Mental Status: He is alert and oriented to person, place, and time.  Psychiatric:        Mood and Affect: Mood normal.        Behavior: Behavior normal. Behavior is cooperative.      UC Treatments / Results  Labs (all labs ordered are listed, but only abnormal results are displayed) Labs Reviewed - No data to display  EKG   Radiology No results  found.  Procedures Procedures (including critical care time)  Medications Ordered in UC Medications - No data to display  Initial Impression / Assessment and Plan / UC Course  I have reviewed the triage vital signs and the nursing notes.  Pertinent labs & imaging results that were available during my care of the patient were reviewed by me and considered in my medical decision making (see chart for details).  Vitals and triage reviewed, patient is hemodynamically stable.  Patient is afebrile, without tachycardia, without wound bed tenderness, fluctuance or induration.  Patient recently finished Augmentin, will supply with topical Bactroban.  Wound care discussed.  Do not see any area for incision and drainage, wound bed appears to be healing well.  Plan of care, return and follow-up precautions given, patient verbalized understanding, no questions at this time.    Final Clinical Impressions(s) / UC Diagnoses   Final diagnoses:  Dog bite of left lower leg with infection, subsequent encounter     Discharge Instructions      Overall your wound is healing well.  It is without warmth, tenderness, abscess or fluctuance. There is no area that needs drainage. Please continue to keep the wound bed clean and dry.  You can apply the antibacterial Bactroban ointment twice daily to the area.  I do not believe you need any more antibiotics, your cellulitis has resolved.  Please return to clinic if you develop fever, warmth, purulent drainage, or any new concerning symptoms for infection.     ED Prescriptions     Medication Sig Dispense Auth. Provider   mupirocin cream (BACTROBAN) 2 % Apply 1 Application topically 2 (two) times daily. 15 g Cathie Bonnell, [place], [place]      PDMP not reviewed this encounter.   Orell Hurtado, [place] N, [place] 01/16/23 [phone]

## 2023-01-21 DIAGNOSIS — S81852A Open bite, left lower leg, initial encounter: Secondary | ICD-10-CM | POA: Diagnosis not present

## 2023-01-21 DIAGNOSIS — R21 Rash and other nonspecific skin eruption: Secondary | ICD-10-CM | POA: Diagnosis not present

## 2023-01-21 DIAGNOSIS — W540XXA Bitten by dog, initial encounter: Secondary | ICD-10-CM | POA: Diagnosis not present

## 2023-01-21 DIAGNOSIS — I10 Essential (primary) hypertension: Secondary | ICD-10-CM | POA: Diagnosis not present

## 2023-01-21 DIAGNOSIS — E785 Hyperlipidemia, unspecified: Secondary | ICD-10-CM | POA: Diagnosis not present

## 2023-01-21 DIAGNOSIS — E782 Mixed hyperlipidemia: Secondary | ICD-10-CM | POA: Diagnosis not present

## 2023-01-22 DIAGNOSIS — H1045 Other chronic allergic conjunctivitis: Secondary | ICD-10-CM | POA: Diagnosis not present

## 2023-01-26 DIAGNOSIS — Z961 Presence of intraocular lens: Secondary | ICD-10-CM | POA: Diagnosis not present

## 2023-01-26 DIAGNOSIS — H1045 Other chronic allergic conjunctivitis: Secondary | ICD-10-CM | POA: Diagnosis not present

## 2023-02-17 DIAGNOSIS — E782 Mixed hyperlipidemia: Secondary | ICD-10-CM | POA: Diagnosis not present

## 2023-02-17 DIAGNOSIS — E559 Vitamin D deficiency, unspecified: Secondary | ICD-10-CM | POA: Diagnosis not present

## 2023-02-17 DIAGNOSIS — E039 Hypothyroidism, unspecified: Secondary | ICD-10-CM | POA: Diagnosis not present

## 2023-02-17 DIAGNOSIS — I10 Essential (primary) hypertension: Secondary | ICD-10-CM | POA: Diagnosis not present

## 2023-03-26 DIAGNOSIS — Z961 Presence of intraocular lens: Secondary | ICD-10-CM | POA: Diagnosis not present

## 2023-03-26 DIAGNOSIS — H25811 Combined forms of age-related cataract, right eye: Secondary | ICD-10-CM | POA: Diagnosis not present

## 2023-03-27 DIAGNOSIS — Z1331 Encounter for screening for depression: Secondary | ICD-10-CM | POA: Diagnosis not present

## 2023-03-27 DIAGNOSIS — E559 Vitamin D deficiency, unspecified: Secondary | ICD-10-CM | POA: Diagnosis not present

## 2023-03-27 DIAGNOSIS — Z1339 Encounter for screening examination for other mental health and behavioral disorders: Secondary | ICD-10-CM | POA: Diagnosis not present

## 2023-03-27 DIAGNOSIS — Z0000 Encounter for general adult medical examination without abnormal findings: Secondary | ICD-10-CM | POA: Diagnosis not present

## 2023-03-27 DIAGNOSIS — I10 Essential (primary) hypertension: Secondary | ICD-10-CM | POA: Diagnosis not present

## 2023-03-27 DIAGNOSIS — E782 Mixed hyperlipidemia: Secondary | ICD-10-CM | POA: Diagnosis not present

## 2023-03-27 DIAGNOSIS — R7301 Impaired fasting glucose: Secondary | ICD-10-CM | POA: Diagnosis not present

## 2023-06-29 DIAGNOSIS — H40023 Open angle with borderline findings, high risk, bilateral: Secondary | ICD-10-CM | POA: Diagnosis not present

## 2023-06-29 DIAGNOSIS — H25811 Combined forms of age-related cataract, right eye: Secondary | ICD-10-CM | POA: Diagnosis not present

## 2023-08-03 ENCOUNTER — Ambulatory Visit (INDEPENDENT_AMBULATORY_CARE_PROVIDER_SITE_OTHER): Payer: Medicare HMO

## 2023-08-03 ENCOUNTER — Encounter: Payer: Self-pay | Admitting: Podiatry

## 2023-08-03 ENCOUNTER — Ambulatory Visit: Payer: Medicare HMO | Admitting: Podiatry

## 2023-08-03 DIAGNOSIS — L84 Corns and callosities: Secondary | ICD-10-CM | POA: Diagnosis not present

## 2023-08-03 DIAGNOSIS — I739 Peripheral vascular disease, unspecified: Secondary | ICD-10-CM | POA: Diagnosis not present

## 2023-08-03 DIAGNOSIS — M2021 Hallux rigidus, right foot: Secondary | ICD-10-CM | POA: Diagnosis not present

## 2023-08-03 DIAGNOSIS — M2041 Other hammer toe(s) (acquired), right foot: Secondary | ICD-10-CM

## 2023-08-03 NOTE — Progress Notes (Signed)
  Subjective:  Patient ID: Aaron Mendez, male    DOB: Jul 03, 1946,  MRN: [id number]  Chief Complaint  Patient presents with   Toe Pain    1st and 2nd toes right - callused area on toes for about 3-4 months, PCP eval-said to use cream on it to help keep softened, very sore, especially with shoes, toes rub together causing pain, patient states he is not diabetic but last A1c was 8.3   New Patient (Initial Visit)    Discussed the use of AI scribe software for clinical note transcription with the patient, who gave verbal consent to proceed.  History of Present Illness         77 year old presents with pain between the toes of the right foot. The pain has been ongoing for approximately six months. The patient describes the pain as sore and tender. The patient has previously been treated with an antifungal cream, which did not provide any relief. The patient denies any numbness or tingling in the feet. The patient also mentions a crack in the skin between the toes, which gets agitated. The patient has not noticed any open sores, bleeding, or drainage from the area.     Objective:    Physical Exam          General: AAO x3, NAD  Dermatological: Hyperkeratotic lesions noted along the medial aspect of the second toe as well as the lateral aspect of the hallux.  There is no underlying ulceration, drainage or any signs of infection.  No promises.  Vascular: Dorsalis Pedis artery and Posterior Tibial artery pedal pulses are 2/4 bilateral with immedate capillary fill time. There is no pain with calf compression, swelling, warmth, erythema.   Neruologic: Grossly intact via light touch bilateral.   Musculoskeletal: Mild bunion present with decreased range of motion first BJ, digital contractures present.  Tenderness on the hyperkeratotic lesions.  No other areas of discomfort.  Gait: Unassisted, Nonantalgic.   No images are attached to the encounter.    Results          Assessment:   1.  Hammertoe of right foot   2. PAD (peripheral artery disease) (HCC)   3. Corns and callosities   4. Hallux rigidus, right foot      Plan:  Patient was evaluated and treated and all questions answered.  Assessment and Plan           Interdigital Corns Pain between toes on right foot for six months.  -Debrided hyperkeratotic lesions x 2 to any complications or bleeding. -Provide toe separators and gel pads to reduce pressure. -Advise on wearing wider shoes with softer material to reduce pressure. -In the future consider surgical intervention symptoms persist.  Decreased Circulation Pulses in feet slightly decreased. X-ray shows calcified blood vessel, a possible sign of decreased circulation. -Order ultrasound to assess blood flow to feet and establish a baseline.  Radiology: X-rays obtained reviewed of the foot.  Multiple views obtained.  Arthritic changes present the first appreciated.  Digital contracture present.  No evidence of acute fracture.  Vessel calcification present.  Follow-up in 2 weeks to assess the effectiveness of pressure reduction measures.   Return if symptoms worsen or fail to improve.    Vivi Barrack DPM

## 2023-08-13 ENCOUNTER — Ambulatory Visit (HOSPITAL_COMMUNITY)
Admission: RE | Admit: 2023-08-13 | Discharge: 2023-08-13 | Disposition: A | Discharge status: Home / Self Care | Payer: Medicare HMO | Source: Ambulatory Visit | Attending: Podiatry | Admitting: Podiatry

## 2023-08-13 DIAGNOSIS — I739 Peripheral vascular disease, unspecified: Secondary | ICD-10-CM | POA: Diagnosis not present

## 2023-08-13 LAB — VAS US ABI WITH/WO TBI
Left ABI: 1.11
Right ABI: 0.92

## 2023-08-17 ENCOUNTER — Other Ambulatory Visit: Payer: Self-pay | Admitting: Podiatry

## 2023-08-17 DIAGNOSIS — L84 Corns and callosities: Secondary | ICD-10-CM

## 2023-08-17 DIAGNOSIS — I739 Peripheral vascular disease, unspecified: Secondary | ICD-10-CM

## 2023-08-27 ENCOUNTER — Encounter: Payer: Self-pay | Admitting: Surgery

## 2023-08-27 ENCOUNTER — Ambulatory Visit: Payer: Medicare HMO | Admitting: Surgery

## 2023-08-27 VITALS — BP 128/84 | HR 60 | Temp 97.9°F | Resp 20 | Ht 69.5 in | Wt 187.0 lb

## 2023-08-27 DIAGNOSIS — I739 Peripheral vascular disease, unspecified: Secondary | ICD-10-CM

## 2023-08-27 DIAGNOSIS — D573 Sickle-cell trait: Secondary | ICD-10-CM | POA: Diagnosis not present

## 2023-08-27 DIAGNOSIS — E1151 Type 2 diabetes mellitus with diabetic peripheral angiopathy without gangrene: Secondary | ICD-10-CM | POA: Diagnosis not present

## 2023-08-27 NOTE — Progress Notes (Signed)
[facility]  Patient name: Aaron Mendez MRN: [id number] DOB: 01-Jul-1946 Sex: male   REQUESTING PROVIDER:    Dr. Ardelle Anton   REASON FOR CONSULT:    Abnormal ABI's / PAD  HISTORY OF PRESENT ILLNESS:   Aaron Mendez is a 77 y.o. male, who is referred today for abnormal ABIs.  He reportedly has had pain between the toes of his right foot for approximately 6 months.  He has tried a antifungal cream which did not provide any relief.  He does describe a crack between the toes which will get irritated.  He saw Dr. Loreta Ave who diagnosed him with hammertoe on the right as well as corns and calluses.  2 hyperkeratotic lesions were debrided in the office in November.  The patient was given toe separators and gel pads.  He has gotten good relief from wearing these.  He denies any open wounds.  He does not endorse symptoms of claudication.  He is a non-smoker.  He does suffer from type 2 diabetes.  He takes a statin for hypercholesterolemia.  He is medically managed for hypertension  PAST MEDICAL HISTORY    Past Medical History:  Diagnosis Date   Bilateral primary osteoarthritis of knee    Hypertension    Sickle cell trait (HCC)      FAMILY HISTORY   Family History  Problem Relation Age of Onset   Sickle cell anemia Son     SOCIAL HISTORY:   Social History   Socioeconomic History   Marital status: Single    Spouse name: Not on file   Number of children: Not on file   Years of education: Not on file   Highest education level: Not on file  Occupational History   Not on file  Tobacco Use   Smoking status: Never   Smokeless tobacco: Never  Substance and Sexual Activity   Alcohol use: No   Drug use: No   Sexual activity: Yes  Other Topics Concern   Not on file  Social History Narrative   Not on file   Social Drivers of Health   Financial Resource Strain: Not on file  Food Insecurity: No Food Insecurity  (12/12/2021)   Hunger Vital Sign    Worried About Running Out of Food in the Last Year: Never true    Ran Out of Food in the Last Year: Never true  Transportation Needs: No Transportation Needs (09/12/2020)   PRAPARE - Administrator, Civil Service (Medical): No    Lack of Transportation (Non-Medical): No  Physical Activity: Not on file  Stress: No Stress Concern Present (12/29/2019)   [employer] of Occupational Health - Occupational Stress Questionnaire    Feeling of Stress : Not at all  Social Connections: Not on file  Intimate Partner Violence: Not on file    ALLERGIES:    No Known Allergies  CURRENT MEDICATIONS:    Current Outpatient Medications  Medication Sig Dispense Refill   Accu-Chek Softclix Lancets lancets Use to check glucose up to two times per day.  Dx: E11.9 100 each 12   aspirin EC 325 MG tablet TAKE 1 TABLET(325 MG) BY MOUTH DAILY 90 tablet 2   Blood Glucose Monitoring Suppl (ACCU-CHEK AVIVA PLUS) w/Device KIT Use to check glucose up to two times per day.  Dx: E11.9 1 kit 0   clopidogrel (PLAVIX) 75 MG tablet TAKE 1 TABLET(75 MG) BY MOUTH DAILY 90 tablet 1   glucose blood (ACCU-CHEK AVIVA  PLUS) test strip Use to check glucose up to two times per day.  Dx: E11.9 100 each 12   latanoprost (XALATAN) 0.005 % ophthalmic solution SMARTSIG:1 Drop(s) In Eye(s) Every Evening     meloxicam (MOBIC) 7.5 MG tablet TAKE 2 TABLETS BY MOUTH DAILY AS NEEDED FOR PAIN. TAKE ONCE YOU'VE COMPLETED STEROID PACK 180 tablet 2   methocarbamol (ROBAXIN) 500 MG tablet Take 1 tablet (500 mg total) by mouth 2 (two) times daily as needed. 20 tablet 0   Multiple Vitamin (MULTIVITAMIN WITH MINERALS) TABS tablet Take 1 tablet by mouth daily.     mupirocin cream (BACTROBAN) 2 % Apply 1 Application topically 2 (two) times daily. 15 g 0   rosuvastatin (CRESTOR) 10 MG tablet TAKE 1 TABLET(10 MG) BY MOUTH DAILY 90 tablet 1   sildenafil (VIAGRA) 100 MG tablet Take 100 mg by mouth daily as  needed.     traMADol (ULTRAM) 50 MG tablet Take 1 tablet (50 mg total) by mouth 3 (three) times daily as needed. 30 tablet 0   triamcinolone ointment (KENALOG) 0.1 % Apply 1 Application topically 2 (two) times daily. Apply twice daily to clean nonbroken skin as needed for itching 30 g 0   valsartan (DIOVAN) 80 MG tablet Take 80 mg by mouth daily.     Vitamin D, Ergocalciferol, (DRISDOL) 1.25 MG (50000 UNIT) CAPS capsule Take 50,000 Units by mouth once a week.     metFORMIN (GLUCOPHAGE) 500 MG tablet TAKE 1 TABLET(500 MG) BY MOUTH DAILY WITH BREAKFAST (Patient not taking: Reported on 08/27/2023) 90 tablet 0   No current facility-administered medications for this visit.    REVIEW OF SYSTEMS:   [X]  denotes positive finding, [ ]  denotes negative finding Cardiac  Comments:  Chest pain or chest pressure:    Shortness of breath upon exertion:    Short of breath when lying flat:    Irregular heart rhythm:        Vascular    Pain in calf, thigh, or hip brought on by ambulation:    Pain in feet at night that wakes you up from your sleep:     Blood clot in your veins:    Leg swelling:         Pulmonary    Oxygen at home:    Productive cough:     Wheezing:         Neurologic    Sudden weakness in arms or legs:     Sudden numbness in arms or legs:     Sudden onset of difficulty speaking or slurred speech:    Temporary loss of vision in one eye:     Problems with dizziness:         Gastrointestinal    Blood in stool:      Vomited blood:         Genitourinary    Burning when urinating:     Blood in urine:        Psychiatric    Major depression:         Hematologic    Bleeding problems:    Problems with blood clotting too easily:        Skin    Rashes or ulcers:        Constitutional    Fever or chills:     PHYSICAL EXAM:   Vitals:   08/27/23 0838  BP: 128/84  Pulse: 60  Resp: 20  Temp: 97.9 F (36.6 C)  SpO2: 97%  Weight: 187 lb (84.8 kg)  Height: 5' 9.5" (1.765  m)    GENERAL: The patient is a well-nourished male, in no acute distress. The vital signs are documented above. CARDIAC: There is a regular rate and rhythm.  VASCULAR: Palpable femoral pulses, nonpalpable popliteal or pedal pulses PULMONARY: Nonlabored respirations ABDOMEN: Soft and non-tender  MUSCULOSKELETAL: There are no major deformities or cyanosis. NEUROLOGIC: No focal weakness or paresthesias are detected. SKIN: There are no ulcers or rashes noted. PSYCHIATRIC: The patient has a normal affect.  STUDIES:   I have reviewed the following:  ABI Findings:  +---------+------------------+-----+----------+--------+  Right   Rt Pressure (mmHg)IndexWaveform  Comment   +---------+------------------+-----+----------+--------+  Brachial 127                                        +---------+------------------+-----+----------+--------+  PTA     121               0.92 monophasic          +---------+------------------+-----+----------+--------+  DP      90                0.69 monophasic          +---------+------------------+-----+----------+--------+  Great Toe73                0.56 Abnormal            +---------+------------------+-----+----------+--------+   +---------+------------------+-----+----------+-------+  Left    Lt Pressure (mmHg)IndexWaveform  Comment  +---------+------------------+-----+----------+-------+  Brachial 131                                       +---------+------------------+-----+----------+-------+  PTA     142               1.08 monophasic         +---------+------------------+-----+----------+-------+  DP      146               1.11 monophasic         +---------+------------------+-----+----------+-------+  Great Toe82                0.63 Abnormal           +---------+------------------+-----+----------+-------+   ASSESSMENT and PLAN   PAD: The patient is currently asymptomatic.  He does not  have any open wounds, and he does not endorse symptoms of claudication.  I discussed that no surgical intervention is necessary at this time.  He should continue his current medical regimen including an aspirin, blood pressure control, management of his diabetes from tobacco, and cholesterol medication (statin)  I will plan on having him follow-up in 1 year with repeat ABIs.  He knows to contact me sooner should he develop a change in symptoms or a nonhealing wound.   Charlena Cross, MD, FACS [facility] [phone] Pager [phone]

## 2023-09-04 ENCOUNTER — Other Ambulatory Visit: Payer: Self-pay

## 2023-09-04 DIAGNOSIS — I739 Peripheral vascular disease, unspecified: Secondary | ICD-10-CM

## 2023-10-16 DIAGNOSIS — H40023 Open angle with borderline findings, high risk, bilateral: Secondary | ICD-10-CM | POA: Diagnosis not present

## 2023-12-02 DIAGNOSIS — H52213 Irregular astigmatism, bilateral: Secondary | ICD-10-CM | POA: Diagnosis not present

## 2023-12-02 DIAGNOSIS — H25811 Combined forms of age-related cataract, right eye: Secondary | ICD-10-CM | POA: Diagnosis not present

## 2023-12-02 DIAGNOSIS — H35371 Puckering of macula, right eye: Secondary | ICD-10-CM | POA: Diagnosis not present

## 2024-01-01 ENCOUNTER — Telehealth: Payer: Self-pay

## 2024-01-01 DIAGNOSIS — Z79899 Other long term (current) drug therapy: Secondary | ICD-10-CM

## 2024-01-01 NOTE — Progress Notes (Signed)
   01/01/2024  Patient ID: Aaron Mendez, male   DOB: May 24, 1946, 78 y.o.   MRN: [id number]  Medication Adherence EOY Report:   This patient is appearing on a report for being at risk of failing the adherence measure for cholesterol (statin) and hypertension (ACEi/ARB) medications this calendar year. Patient was called with HIPAA identifiers.     [facility]: rosuvastatin  10 mg last filled on 12/04/23 x90 days supply but was sold on 12/11/2023  [facility]: valsartan 80 mg last filled on 12/25/2023 x90 days supply. Patient was reminded this medication was filled and ready for pick up; he states that he will pick up medication later today.   Discussed barriers to adherence, which included continuing to use pillbox and will follow up in July for refills. Will encourage patient to schedule an appointment with PCP.   Alexandria Angel, PharmD Clinical Pharmacist Cell: [phone]

## 2024-01-17 ENCOUNTER — Encounter (HOSPITAL_COMMUNITY): Payer: Self-pay

## 2024-01-17 ENCOUNTER — Ambulatory Visit (HOSPITAL_COMMUNITY)
Admission: EM | Admit: 2024-01-17 | Discharge: 2024-01-17 | Disposition: A | Discharge status: Home / Self Care | Attending: Internal Medicine | Admitting: Internal Medicine

## 2024-01-17 DIAGNOSIS — L237 Allergic contact dermatitis due to plants, except food: Secondary | ICD-10-CM

## 2024-01-17 MED ORDER — PREDNISONE 10 MG PO TABS: ORAL_TABLET | ORAL | 0 refills | Status: AC

## 2024-01-17 MED ORDER — TRIAMCINOLONE ACETONIDE 0.1 % EX CREA: 1.0000 | TOPICAL_CREAM | Freq: Two times a day (BID) | CUTANEOUS | 0 refills | Status: AC | PRN

## 2024-01-17 MED ORDER — METHYLPREDNISOLONE SODIUM SUCC 125 MG IJ SOLR
40.0000 mg | Freq: Once | INTRAMUSCULAR | Status: AC
  Administered 2024-01-17: 40 mg via INTRAMUSCULAR

## 2024-01-17 MED ORDER — METHYLPREDNISOLONE SODIUM SUCC 125 MG IJ SOLR
INTRAMUSCULAR | Status: AC
  Filled 2024-01-17: qty 2

## 2024-01-17 NOTE — Discharge Instructions (Addendum)
 Poison ivy dermatitis (allergic reaction). Given that this is affected the face and near the eyes, we have treated with an injection of steroids and will then start a steroid taper tomorrow. Will also use a steroid cream to help with the itching. Discharge information:  Medrol  injection given today. This is a steroid to help with inflammation, itching and allergic reaction. Prednisone  taper starting with 60 mg tomorrow (5/12) and decreasing by 1 tablet daily until finished. Take this in the morning.  This is a steroid to help with inflammation, itching and allergic reaction. Triamcinolone  cream twice daily to the affected area for itching as needed. May use sparingly on the neck.  Do not apply this to the face. Return to urgent care or PCP if symptoms worsen or fail to resolve.

## 2024-01-17 NOTE — ED Triage Notes (Signed)
 Patient states has a rash all over the body onset 3 days ago. Patient states he got into some poison ivy. The rash is now on the face going towards the left eye.   Patient tried Calamine lotion with little relief.

## 2024-01-17 NOTE — ED Provider Notes (Addendum)
 [facility]    CSN: [id number] Arrival date & time: 01/17/24  1001      History   Chief Complaint Chief Complaint  Patient presents with   Rash    HPI Aaron Mendez is a 78 y.o. male.   78 year old male who presents urgent care with complaints of a diffuse rash after working in the yard with poison ivy.  The patient reports that this happened on Friday.  He has been trying to use calamine but is not helping.  The rash is on his face and seems to be getting closer to his eyes.  The areas are very itchy.  He denies any shortness of breath, tongue swelling, throat swelling or any difficulty breathing or swallowing.  He has had this happen in the past.  He normally does not get this on the face.  He denies any fevers or chills.   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 No pertinent surgical history.     Home Medications    Prior to Admission medications   Medication Sig Start Date End Date Taking? Authorizing Provider  aspirin  EC 325 MG  tablet TAKE 1 TABLET(325 MG) BY MOUTH DAILY 05/28/20  Yes Adela Holter, DO  clopidogrel  (PLAVIX ) 75 MG tablet TAKE 1 TABLET(75 MG) BY MOUTH DAILY 12/25/20  Yes Adela Holter, DO  latanoprost (XALATAN) 0.005 % ophthalmic solution SMARTSIG:1 Drop(s) In Eye(s) Every Evening 06/29/23  Yes [provider]  Multiple Vitamin (MULTIVITAMIN WITH MINERALS) TABS tablet Take 1 tablet by mouth daily.   Yes [provider]  predniSONE  (DELTASONE ) 10 MG tablet Take 6 tablets (60 mg total) by mouth daily with breakfast for 1 day, THEN 5 tablets (50 mg total) daily with breakfast for 1 day, THEN 4 tablets (40 mg total) daily with breakfast for 1 day, THEN 3 tablets (30 mg total) daily with breakfast for 1 day, THEN 2 tablets (20 mg total) daily with breakfast for 1 day, THEN 1 tablet (10 mg total) daily with breakfast for 1 day. 01/17/24 01/23/24 Yes Lorren Rossetti A, PA-C  rosuvastatin  (CRESTOR ) 10 MG tablet TAKE 1 TABLET(10 MG) BY MOUTH DAILY 12/25/20  Yes Adela Holter, DO  triamcinolone  cream (KENALOG ) 0.1 % Apply 1 Application topically 2 (two) times daily as needed (itching). Apply to arms, trunk. May apply sparingly to the neck. Do not apply to face 01/17/24  Yes Kreg Pesa, PA-C  Accu-Chek Softclix Lancets lancets Use to check glucose up to two times per day.  Dx: E11.9 11/09/19   Adela Holter, DO  Blood Glucose Monitoring Suppl (ACCU-CHEK AVIVA PLUS) w/Device KIT Use to check glucose up to two times per day.  Dx: E11.9 11/09/19   Adela Holter, DO  glucose blood (ACCU-CHEK AVIVA PLUS) test strip Use to check glucose up to two times per day.  Dx: E11.9 11/09/19   Adela Holter, DO  meloxicam  (MOBIC ) 7.5 MG tablet TAKE 2 TABLETS BY MOUTH DAILY AS NEEDED FOR PAIN. TAKE ONCE YOU'VE COMPLETED STEROID PACK 11/23/19   Sandie Cross, PA-C  metFORMIN  (GLUCOPHAGE ) 500 MG tablet TAKE 1 TABLET(500 MG) BY MOUTH DAILY WITH BREAKFAST Patient not taking: Reported on 08/27/2023 08/06/20   Adela Holter, DO  methocarbamol  (ROBAXIN ) 500 MG tablet Take 1 tablet (500 mg total) by mouth 2 (two) times daily as needed. 04/10/20   Sandie Cross, PA-C  mupirocin  cream (BACTROBAN ) 2 % Apply 1 Application topically 2 (two) times daily. 01/16/23   Harlow Lighter, [place]  N, FNP  sildenafil (VIAGRA) 100 MG tablet Take 100 mg by mouth daily as needed. 06/01/23   [provider]  traMADol  (ULTRAM ) 50 MG tablet Take 1 tablet (50 mg total) by mouth 3 (three) times daily as needed. 08/14/20   Sandie Cross, PA-C  valsartan (DIOVAN) 80 MG tablet Take 80 mg by mouth daily. 03/27/23   [provider]  Vitamin D, Ergocalciferol, (DRISDOL) 1.25 MG (50000 UNIT) CAPS capsule Take 50,000 Units by mouth once a week. 03/28/23   [provider]    Family History Family History  Problem Relation Age of Onset   Sickle cell anemia Son     Social History Social History   Tobacco Use   Smoking status: Never   Smokeless tobacco: Never  Substance Use Topics   Alcohol  use: No   Drug use: No     Allergies   Patient has no known allergies.   Review of Systems Review of Systems  Constitutional:  Negative for chills and fever.  HENT:  Negative for ear pain and sore throat.   Eyes:  Negative for photophobia, pain and visual disturbance.  Respiratory:  Negative for cough and shortness of breath.   Cardiovascular:  Negative for chest pain and palpitations.  Gastrointestinal:  Negative for abdominal pain and vomiting.  Genitourinary:  Negative for dysuria and hematuria.  Musculoskeletal:  Negative for arthralgias and back pain.  Skin:  Positive for rash. Negative for color change.  Neurological:  Negative for seizures and syncope.  All other systems reviewed and are negative.    Physical Exam Triage Vital Signs ED Triage Vitals  Encounter Vitals Group     BP 01/17/24 1017 130/72     Systolic BP Percentile --      Diastolic BP Percentile --      Pulse Rate 01/17/24 1017 63      Resp 01/17/24 1017 16     Temp 01/17/24 1017 97.8 F (36.6 C)     Temp Source 01/17/24 1017 Oral     SpO2 01/17/24 1017 97 %     Weight --      Height --      Head Circumference --      Peak Flow --      Pain Score 01/17/24 1015 5     Pain Loc --      Pain Education --      Exclude from Growth Chart --    No data found.  Updated Vital Signs BP 130/72 (BP Location: Left Arm)   Pulse 63  Temp 97.8 F (36.6 C) (Oral)   Resp 16   SpO2 97%   Visual Acuity Right Eye Distance:   Left Eye Distance:   Bilateral Distance:    Right Eye Near:   Left Eye Near:    Bilateral Near:     Physical Exam Vitals and nursing note reviewed.  Constitutional:      General: He is not in acute distress.    Appearance: He is well-developed.  HENT:     Head: Normocephalic and atraumatic.     Nose: No mucosal edema.     Right Turbinates: Not swollen.     Left Turbinates: Not swollen.     Mouth/Throat:     Pharynx: No pharyngeal swelling or posterior oropharyngeal erythema.  Eyes:     Conjunctiva/sclera: Conjunctivae normal.  Cardiovascular:     Rate and Rhythm: Normal rate and regular rhythm.     Heart sounds: No murmur heard. Pulmonary:     Effort: Pulmonary effort is normal. No respiratory distress.     Breath sounds: Normal breath sounds.  Abdominal:     Palpations: Abdomen is soft.     Tenderness: There is no abdominal tenderness.  Musculoskeletal:        General: No swelling.     Cervical back: Neck supple.  Skin:    General: Skin is warm and dry.     Capillary Refill: Capillary refill takes less than 2 seconds.     Comments: Bilateral forearms, left neck and left face near the left eye with erythematous and urticarial rash with some flaking of the skin from scratching. No airway involvement.  Neurological:     Mental Status: He is alert.  Psychiatric:        Mood and Affect: Mood normal.      UC Treatments / Results  Labs (all labs ordered are listed, but only  abnormal results are displayed) Labs Reviewed - No data to display  EKG   Radiology No results found.  Procedures Procedures (including critical care time)  Medications Ordered in UC Medications  methylPREDNISolone  sodium succinate (SOLU-MEDROL ) 125 mg/2 mL injection 40 mg (has no administration in time range)    Initial Impression / Assessment and Plan / UC Course  I have reviewed the triage vital signs and the nursing notes.  Pertinent labs & imaging results that were available during my care of the patient were reviewed by me and considered in my medical decision making (see chart for details).     Poison ivy dermatitis   Poison ivy dermatitis (allergic reaction). Given that this is affected the face and near the eyes, we have treated with an injection of steroids and will then start a steroid taper tomorrow. Will also use a steroid cream to help with the itching. Discharge information:  Medrol  injection given today due to the facial involvement.  We did dose adjust for the patient's age to 40 mg. Prednisone  taper starting with 60 mg tomorrow (5/12) and decreasing by 1 tablet daily until finished.  Triamcinolone  cream twice daily to the affected area for itching as needed. May use sparingly on the neck.  Do not apply this to the face. Return to urgent care or PCP if symptoms worsen or fail to resolve.       Final Clinical Impressions(s) / UC Diagnoses   Final diagnoses:  Poison ivy dermatitis     Discharge Instructions      Poison ivy dermatitis (allergic reaction). Given that this is affected the  face and near the eyes, we have treated with an injection of steroids and will then start a steroid taper tomorrow. Will also use a steroid cream to help with the itching. Discharge information:  Medrol  injection given today. This is a steroid to help with inflammation, itching and allergic reaction. Prednisone  taper starting with 60 mg tomorrow (5/12) and decreasing by 1  tablet daily until finished. Take this in the morning.  This is a steroid to help with inflammation, itching and allergic reaction. Triamcinolone  cream twice daily to the affected area for itching as needed. May use sparingly on the neck.  Do not apply this to the face. Return to urgent care or PCP if symptoms worsen or fail to resolve.       ED Prescriptions     Medication Sig Dispense Auth. Provider   predniSONE  (DELTASONE ) 10 MG tablet Take 6 tablets (60 mg total) by mouth daily with breakfast for 1 day, THEN 5 tablets (50 mg total) daily with breakfast for 1 day, THEN 4 tablets (40 mg total) daily with breakfast for 1 day, THEN 3 tablets (30 mg total) daily with breakfast for 1 day, THEN 2 tablets (20 mg total) daily with breakfast for 1 day, THEN 1 tablet (10 mg total) daily with breakfast for 1 day. 21 tablet Rolen Conger A, PA-C   triamcinolone  cream (KENALOG ) 0.1 % Apply 1 Application topically 2 (two) times daily as needed (itching). Apply to arms, trunk. May apply sparingly to the neck. Do not apply to face 80 g Kreg Pesa, PA-C      PDMP not reviewed this encounter.   Kreg Pesa, PA-C 01/17/24 1040    Kreg Pesa, [place] 01/17/24 1042

## 2024-03-07 NOTE — Progress Notes (Addendum)
   03/07/2024  Patient ID: Aaron Mendez, male   DOB: [birth year], 78 y.o.   MRN: [id number]  Reviewed patient regarding medication adherence from a quality report for Almarie Scala, MD.    Per [facility] and payer portal fill history:  Rosuvastatin  10 mg - last filled 03/05/24 for a 90-day supply. Valsartan 80 mg - last filled 12/25/23 for a 90-day supply.   I will continue to follow-up to monitor medication adherence.   Thank you for allowing pharmacy to be a part of this patient's care.   Heather Factor, PharmD Clinical Pharmacist  [phone]

## 2024-04-06 DIAGNOSIS — Z125 Encounter for screening for malignant neoplasm of prostate: Secondary | ICD-10-CM | POA: Diagnosis not present

## 2024-04-06 DIAGNOSIS — Z0000 Encounter for general adult medical examination without abnormal findings: Secondary | ICD-10-CM | POA: Diagnosis not present

## 2024-04-06 DIAGNOSIS — Z13220 Encounter for screening for lipoid disorders: Secondary | ICD-10-CM | POA: Diagnosis not present

## 2024-04-11 DIAGNOSIS — N390 Urinary tract infection, site not specified: Secondary | ICD-10-CM | POA: Diagnosis not present

## 2024-04-11 DIAGNOSIS — I10 Essential (primary) hypertension: Secondary | ICD-10-CM | POA: Diagnosis not present

## 2024-04-11 DIAGNOSIS — N3000 Acute cystitis without hematuria: Secondary | ICD-10-CM | POA: Diagnosis not present

## 2024-04-11 DIAGNOSIS — Z8639 Personal history of other endocrine, nutritional and metabolic disease: Secondary | ICD-10-CM | POA: Diagnosis not present

## 2024-04-11 DIAGNOSIS — R531 Weakness: Secondary | ICD-10-CM | POA: Diagnosis not present

## 2024-04-12 DIAGNOSIS — N3000 Acute cystitis without hematuria: Secondary | ICD-10-CM | POA: Diagnosis not present

## 2024-04-12 DIAGNOSIS — M25569 Pain in unspecified knee: Secondary | ICD-10-CM | POA: Diagnosis not present

## 2024-04-12 DIAGNOSIS — N12 Tubulo-interstitial nephritis, not specified as acute or chronic: Secondary | ICD-10-CM | POA: Diagnosis not present

## 2024-04-13 DIAGNOSIS — Z0000 Encounter for general adult medical examination without abnormal findings: Secondary | ICD-10-CM | POA: Diagnosis not present

## 2024-04-13 DIAGNOSIS — Z23 Encounter for immunization: Secondary | ICD-10-CM | POA: Diagnosis not present

## 2024-04-13 DIAGNOSIS — N12 Tubulo-interstitial nephritis, not specified as acute or chronic: Secondary | ICD-10-CM | POA: Diagnosis not present

## 2024-04-15 ENCOUNTER — Emergency Department (HOSPITAL_COMMUNITY)

## 2024-04-15 ENCOUNTER — Ambulatory Visit (HOSPITAL_COMMUNITY)
Admission: EM | Admit: 2024-04-15 | Discharge: 2024-04-15 | Disposition: A | Discharge status: Home / Self Care | Attending: Family Medicine | Admitting: Family Medicine

## 2024-04-15 ENCOUNTER — Observation Stay (HOSPITAL_COMMUNITY)

## 2024-04-15 ENCOUNTER — Encounter (HOSPITAL_COMMUNITY): Payer: Self-pay | Admitting: Internal Medicine

## 2024-04-15 ENCOUNTER — Observation Stay (HOSPITAL_COMMUNITY)
Admission: EM | Admit: 2024-04-15 | Discharge: 2024-04-17 | Disposition: A | Discharge status: Home / Self Care | Source: Ambulatory Visit | Attending: Student | Admitting: Student

## 2024-04-15 ENCOUNTER — Encounter (HOSPITAL_COMMUNITY): Payer: Self-pay | Admitting: *Deleted

## 2024-04-15 ENCOUNTER — Other Ambulatory Visit: Payer: Self-pay

## 2024-04-15 DIAGNOSIS — R2681 Unsteadiness on feet: Secondary | ICD-10-CM | POA: Insufficient documentation

## 2024-04-15 DIAGNOSIS — G894 Chronic pain syndrome: Secondary | ICD-10-CM | POA: Diagnosis not present

## 2024-04-15 DIAGNOSIS — I10 Essential (primary) hypertension: Secondary | ICD-10-CM | POA: Diagnosis not present

## 2024-04-15 DIAGNOSIS — E119 Type 2 diabetes mellitus without complications: Secondary | ICD-10-CM | POA: Diagnosis not present

## 2024-04-15 DIAGNOSIS — I639 Cerebral infarction, unspecified: Principal | ICD-10-CM | POA: Insufficient documentation

## 2024-04-15 DIAGNOSIS — R001 Bradycardia, unspecified: Secondary | ICD-10-CM | POA: Diagnosis not present

## 2024-04-15 DIAGNOSIS — I6502 Occlusion and stenosis of left vertebral artery: Secondary | ICD-10-CM | POA: Insufficient documentation

## 2024-04-15 DIAGNOSIS — I6329 Cerebral infarction due to unspecified occlusion or stenosis of other precerebral arteries: Secondary | ICD-10-CM

## 2024-04-15 DIAGNOSIS — Z8679 Personal history of other diseases of the circulatory system: Secondary | ICD-10-CM | POA: Diagnosis not present

## 2024-04-15 DIAGNOSIS — R29898 Other symptoms and signs involving the musculoskeletal system: Secondary | ICD-10-CM | POA: Diagnosis not present

## 2024-04-15 DIAGNOSIS — R531 Weakness: Secondary | ICD-10-CM | POA: Diagnosis not present

## 2024-04-15 DIAGNOSIS — R29703 NIHSS score 3: Secondary | ICD-10-CM | POA: Diagnosis not present

## 2024-04-15 DIAGNOSIS — M1990 Unspecified osteoarthritis, unspecified site: Secondary | ICD-10-CM | POA: Diagnosis not present

## 2024-04-15 DIAGNOSIS — I6782 Cerebral ischemia: Secondary | ICD-10-CM | POA: Diagnosis not present

## 2024-04-15 DIAGNOSIS — Z7982 Long term (current) use of aspirin: Secondary | ICD-10-CM | POA: Diagnosis not present

## 2024-04-15 DIAGNOSIS — Z6827 Body mass index (BMI) 27.0-27.9, adult: Secondary | ICD-10-CM | POA: Diagnosis not present

## 2024-04-15 DIAGNOSIS — I708 Atherosclerosis of other arteries: Secondary | ICD-10-CM | POA: Diagnosis not present

## 2024-04-15 DIAGNOSIS — R9082 White matter disease, unspecified: Secondary | ICD-10-CM | POA: Diagnosis not present

## 2024-04-15 DIAGNOSIS — R29818 Other symptoms and signs involving the nervous system: Secondary | ICD-10-CM | POA: Diagnosis not present

## 2024-04-15 DIAGNOSIS — I6523 Occlusion and stenosis of bilateral carotid arteries: Secondary | ICD-10-CM | POA: Diagnosis not present

## 2024-04-15 HISTORY — DX: Peripheral vascular disease, unspecified: I73.9

## 2024-04-15 HISTORY — DX: Type 2 diabetes mellitus without complications: E11.9

## 2024-04-15 LAB — CBC WITH DIFFERENTIAL/PLATELET
Abs Immature Granulocytes: 0.01 K/uL (ref 0.00–0.07)
Basophils Absolute: 0 K/uL (ref 0.0–0.1)
Basophils Relative: 1 %
Eosinophils Absolute: 0.2 K/uL (ref 0.0–0.5)
Eosinophils Relative: 3 %
HCT: 43.7 % (ref 39.0–52.0)
Hemoglobin: 13.7 g/dL (ref 13.0–17.0)
Immature Granulocytes: 0 %
Lymphocytes Relative: 23 %
Lymphs Abs: 1.5 K/uL (ref 0.7–4.0)
MCH: 28.7 pg (ref 26.0–34.0)
MCHC: 31.4 g/dL (ref 30.0–36.0)
MCV: 91.6 fL (ref 80.0–100.0)
Monocytes Absolute: 0.7 K/uL (ref 0.1–1.0)
Monocytes Relative: 11 %
Neutro Abs: 3.9 K/uL (ref 1.7–7.7)
Neutrophils Relative %: 62 %
Platelets: 206 K/uL (ref 150–400)
RBC: 4.77 MIL/uL (ref 4.22–5.81)
RDW: 12.1 % (ref 11.5–15.5)
WBC: 6.3 K/uL (ref 4.0–10.5)
nRBC: 0 % (ref 0.0–0.2)

## 2024-04-15 LAB — COMPREHENSIVE METABOLIC PANEL WITH GFR
ALT: 19 U/L (ref 0–44)
AST: 20 U/L (ref 15–41)
Albumin: 3.9 g/dL (ref 3.5–5.0)
Alkaline Phosphatase: 42 U/L (ref 38–126)
Anion gap: 9 (ref 5–15)
BUN: 8 mg/dL (ref 8–23)
CO2: 24 mmol/L (ref 22–32)
Calcium: 8.6 mg/dL — ABNORMAL LOW (ref 8.9–10.3)
Chloride: 108 mmol/L (ref 98–111)
Creatinine, Ser: 1.04 mg/dL (ref 0.61–1.24)
GFR, Estimated: >60 mL/min (ref >60)
Glucose, Bld: 108 mg/dL — ABNORMAL HIGH (ref 70–99)
Potassium: 4 mmol/L (ref 3.5–5.1)
Sodium: 141 mmol/L (ref 135–145)
Total Bilirubin: 1.3 mg/dL — ABNORMAL HIGH (ref 0.0–1.2)
Total Protein: 6.9 g/dL (ref 6.5–8.1)

## 2024-04-15 LAB — URINALYSIS, ROUTINE W REFLEX MICROSCOPIC
Bilirubin Urine: NEGATIVE
Glucose, UA: NEGATIVE mg/dL
Hgb urine dipstick: NEGATIVE
Ketones, ur: NEGATIVE mg/dL
Leukocytes,Ua: NEGATIVE
Nitrite: NEGATIVE
Protein, ur: NEGATIVE mg/dL
Specific Gravity, Urine: 1.012 (ref 1.005–1.030)
pH: 6 (ref 5.0–8.0)

## 2024-04-15 LAB — CBG MONITORING, ED
Glucose-Capillary: 83 mg/dL (ref 70–99)
Glucose-Capillary: 121 mg/dL — ABNORMAL HIGH (ref 70–99)

## 2024-04-15 LAB — CK: Total CK: 165 U/L (ref 49–397)

## 2024-04-15 MED ORDER — ACETAMINOPHEN 650 MG RE SUPP: 650.0000 mg | Freq: Four times a day (QID) | RECTAL | Status: DC | PRN

## 2024-04-15 MED ORDER — LATANOPROST 0.005 % OP SOLN
1.0000 [drp] | Freq: Every day | OPHTHALMIC | Status: DC
  Administered 2024-04-15: 1 [drp] via OPHTHALMIC
  Filled 2024-04-15: qty 2.5

## 2024-04-15 MED ORDER — INSULIN ASPART 100 UNIT/ML IJ SOLN: 0.0000 [IU] | Freq: Every day | INTRAMUSCULAR | Status: DC

## 2024-04-15 MED ORDER — ROSUVASTATIN CALCIUM 5 MG PO TABS
10.0000 mg | ORAL_TABLET | Freq: Every evening | ORAL | Status: DC
  Administered 2024-04-16: 10 mg via ORAL
  Filled 2024-04-15: qty 2

## 2024-04-15 MED ORDER — ONDANSETRON HCL 4 MG/2ML IJ SOLN: 4.0000 mg | Freq: Four times a day (QID) | INTRAMUSCULAR | Status: DC | PRN

## 2024-04-15 MED ORDER — INSULIN ASPART 100 UNIT/ML IJ SOLN
0.0000 [IU] | Freq: Three times a day (TID) | INTRAMUSCULAR | Status: DC
  Administered 2024-04-16: 1 [IU] via SUBCUTANEOUS

## 2024-04-15 MED ORDER — IRBESARTAN 150 MG PO TABS
75.0000 mg | ORAL_TABLET | Freq: Every day | ORAL | Status: DC
  Administered 2024-04-16 – 2024-04-17 (×2): 75 mg via ORAL
  Filled 2024-04-15 (×2): qty 1

## 2024-04-15 MED ORDER — ACETAMINOPHEN 325 MG PO TABS: 650.0000 mg | ORAL_TABLET | Freq: Four times a day (QID) | ORAL | Status: DC | PRN

## 2024-04-15 MED ORDER — STROKE: EARLY STAGES OF RECOVERY BOOK
Freq: Once | Status: AC
  Filled 2024-04-15: qty 1

## 2024-04-15 MED ORDER — CLOPIDOGREL BISULFATE 300 MG PO TABS
300.0000 mg | ORAL_TABLET | Freq: Once | ORAL | Status: AC
  Administered 2024-04-15: 300 mg via ORAL
  Filled 2024-04-15: qty 1

## 2024-04-15 MED ORDER — MELATONIN 3 MG PO TABS: 3.0000 mg | ORAL_TABLET | Freq: Every evening | ORAL | Status: DC | PRN

## 2024-04-15 MED ORDER — IOHEXOL 350 MG/ML SOLN
75.0000 mL | Freq: Once | INTRAVENOUS | Status: AC | PRN
  Administered 2024-04-15: 75 mL via INTRAVENOUS

## 2024-04-15 MED ORDER — CLOPIDOGREL BISULFATE 75 MG PO TABS
75.0000 mg | ORAL_TABLET | Freq: Every day | ORAL | Status: DC
  Administered 2024-04-16 – 2024-04-17 (×2): 75 mg via ORAL
  Filled 2024-04-15 (×2): qty 1

## 2024-04-15 NOTE — ED Notes (Signed)
 Patient is being discharged from the Urgent Care and sent to the Emergency Department via [facility] . Per Dr Vonna, patient is in need of higher level of care due to recent falls with unsteady gait. Patient is aware and verbalizes understanding of plan of care.  Vitals:   04/15/24 1100  BP: (!) 144/78  Pulse: (!) 53  Resp: 18  Temp: (!) 97.5 F (36.4 C)  SpO2: 96%

## 2024-04-15 NOTE — Discharge Instructions (Addendum)
 Will be called to transport patient to the ER.

## 2024-04-15 NOTE — ED Provider Notes (Signed)
 [facility]    CSN: [id number] Arrival date & time: 04/15/24  1008      History   Chief Complaint Chief Complaint  Patient presents with   Fall    HPI Aaron Mendez is a 78 y.o. male.    Fall  Here for frequent falls this week and feeling that his legs are weak, more specifically his left leg.  He has not hit his head nor is he had any syncopal episodes.  His appetite has been good and no nausea or vomiting.  His arms seem to be working well.  He gives a little bit of mixed information, some stating that his left leg is weak and that is why he is following but then he feels that he fell onto his knee and that has maybe then made him feel weaker in the leg.  No headache.  Staff watching him walk to the exam room noted him to have a very unsteady gait.  NKDA  Past medical history is significant for diabetes and possible history of stroke.  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 No pertinent surgical history.     Home Medications    Prior to Admission medications   Medication Sig Start Date End Date Taking? Authorizing  Provider  aspirin  EC 325 MG tablet TAKE 1 TABLET(325 MG) BY MOUTH DAILY 05/28/20  Yes Alvia Bring, DO  latanoprost  (XALATAN ) 0.005 % ophthalmic solution SMARTSIG:1 Drop(s) In Eye(s) Every Evening 06/29/23  Yes [provider]  meloxicam  (MOBIC ) 7.5 MG tablet TAKE 2 TABLETS BY MOUTH DAILY AS NEEDED FOR PAIN. TAKE ONCE YOU'VE COMPLETED STEROID PACK 11/23/19  Yes Jule Ronal CROME, PA-C  Multiple Vitamin (MULTIVITAMIN WITH MINERALS) TABS tablet Take 1 tablet by mouth daily.   Yes [provider]  rosuvastatin  (CRESTOR ) 10 MG tablet TAKE 1 TABLET(10 MG) BY MOUTH DAILY 12/25/20  Yes Alvia Bring, DO  Vitamin D, Ergocalciferol, (DRISDOL) 1.25 MG (50000 UNIT) CAPS capsule Take 50,000 Units by mouth once a week. 03/28/23  Yes [provider]  Accu-Chek Softclix Lancets lancets Use to check glucose up to two times per day.  Dx: E11.9 11/09/19   Alvia Bring, DO  Blood Glucose Monitoring Suppl (ACCU-CHEK AVIVA PLUS) w/Device KIT Use to check glucose up to two times per day.  Dx: E11.9 11/09/19   Alvia Bring, DO  clopidogrel  (PLAVIX ) 75 MG tablet TAKE 1 TABLET(75 MG) BY MOUTH DAILY 12/25/20   Alvia Bring, DO  glucose blood (ACCU-CHEK AVIVA PLUS) test strip Use to check glucose up to two times per day.  Dx: E11.9 11/09/19   Alvia Bring, DO  metFORMIN  (GLUCOPHAGE ) 500 MG tablet TAKE  1 TABLET(500 MG) BY MOUTH DAILY WITH BREAKFAST Patient not taking: Reported on 08/27/2023 08/06/20   Alvia Bring, DO  methocarbamol  (ROBAXIN ) 500 MG tablet Take 1 tablet (500 mg total) by mouth 2 (two) times daily as needed. 04/10/20   Jule Ronal CROME, PA-C  mupirocin  cream (BACTROBAN ) 2 % Apply 1 Application topically 2 (two) times daily. 01/16/23   Dreama, [place]  N, FNP  sildenafil (VIAGRA) 100 MG tablet Take 100 mg by mouth daily as needed. 06/01/23   [provider]  traMADol  (ULTRAM ) 50 MG tablet Take 1 tablet (50 mg total) by mouth 3 (three) times daily as needed. 08/14/20   Jule Ronal CROME, PA-C  triamcinolone  cream (KENALOG ) 0.1 % Apply 1 Application topically 2 (two) times daily as needed (itching). Apply to arms, trunk. May apply sparingly to the neck. Do not apply to face 01/17/24   Teresa Almarie LABOR, PA-C  valsartan (DIOVAN) 80 MG tablet Take 80 mg by mouth daily. 03/27/23   [provider]    Family History Family History  Problem Relation Age of Onset   Sickle cell anemia Son     Social History Social History   Tobacco Use   Smoking status: Never   Smokeless tobacco: Never  Vaping Use   Vaping status: Never Used  Substance Use Topics   Alcohol  use: No   Drug use: No     Allergies   Patient has no known allergies.   Review of Systems Review of Systems   Physical Exam Triage Vital Signs ED Triage Vitals  Encounter Vitals Group     BP 04/15/24 1100 (!) 144/78     Girls Systolic BP Percentile --      Girls Diastolic BP Percentile --      Boys Systolic BP Percentile --      Boys Diastolic BP Percentile --      Pulse Rate 04/15/24 1100 (!) 53     Resp 04/15/24 1100 18     Temp 04/15/24 1100 (!) 97.5 F (36.4 C)     Temp Source 04/15/24 1100 Oral     SpO2 04/15/24 1100 96 %     Weight --      Height --      Head Circumference --      Peak Flow --      Pain Score 04/15/24 1101 6     Pain Loc --      Pain Education --      Exclude from Growth Chart --    No data found.  Updated Vital Signs BP (!) 144/78   Pulse (!) 53   Temp (!) 97.5 F (36.4 C) (Oral)   Resp 18   SpO2 96%   Visual Acuity Right Eye Distance:   Left Eye Distance:   Bilateral Distance:    Right Eye Near:   Left Eye Near:    Bilateral Near:     Physical Exam Vitals reviewed.  Constitutional:      General: He is not in acute distress.    Appearance: He is not toxic-appearing.  HENT:     Mouth/Throat:     Mouth: Mucous membranes are moist.  Eyes:     Extraocular Movements: Extraocular movements intact.     Pupils: Pupils are equal, round, and  reactive to light.  Cardiovascular:     Rate and Rhythm: Regular rhythm. Bradycardia present.  Pulmonary:     Effort: Pulmonary effort is normal.     Breath sounds:  Normal breath sounds.  Musculoskeletal:     Cervical back: Neck supple.  Lymphadenopathy:     Cervical: No cervical adenopathy.  Skin:    Coloration: Skin is not pale.  Neurological:     Mental Status: He is alert.     Comments: The left lower leg is possibly a little weak compared to the other.  Grip strength is normal and there is no facial droop.  Psychiatric:        Behavior: Behavior normal.      UC Treatments / Results  Labs (all labs ordered are listed, but only abnormal results are displayed) Labs Reviewed - No data to display  EKG   Radiology No results found.  Procedures Procedures (including critical care time)  Medications Ordered in UC Medications - No data to display  Initial Impression / Assessment and Plan / UC Course  I have reviewed the triage vital signs and the nursing notes.  Pertinent labs & imaging results that were available during my care of the patient were reviewed by me and considered in my medical decision making (see chart for details).     With his possible mild confusion and possible left leg weakness that is new, I want him to be seen in the emergency room for urgent evaluation.  I discussed with him that I think he needs probably urgent blood work and possibly other things like scans.  I did acknowledged to him that it could be that his left leg feels weak because he initially hurt it.  He did note that he had had complete physical and blood work at his primary care about 5 days ago.  I could not find what labs have been done and I think he needs urgent evaluation still. Final Clinical Impressions(s) / UC Diagnoses   Final diagnoses:  Left leg weakness  Weakness     Discharge Instructions      Will be called to transport patient to the ER.     ED  Prescriptions   None    PDMP not reviewed this encounter.   Vonna Sharlet POUR, MD 04/15/24 [phone]

## 2024-04-15 NOTE — ED Provider Notes (Signed)
 [facility] Provider Note   CSN: [id number] Arrival date & time: 04/15/24  1156     Patient presents with: Weakness   Aaron Mendez is a 78 y.o. male with a past medical history of T2DM, urinary incontinence, CVA, sickle cell trait, HTN presents to emergency department via EMS for evaluation of lower extremity weakness for past four days.  Reports that he has pain to his left knee that causes his leg to give out.  This week he has had 2 falls 3 and 4 days ago secondary to leg giving out. He was able to grab a hold of something and drop himself down to ground. Family at bedside denies AMS, slurred speech, facial droop. Denies injury following fall, head injury, LOC, paresthesia, numbness    Weakness      Prior to Admission medications   Medication Sig Start Date End Date Taking? Authorizing Provider  aspirin  EC 325 MG tablet TAKE 1 TABLET(325 MG) BY MOUTH DAILY 05/28/20  Yes Alvia Bring, DO  ciprofloxacin (CIPRO) 500 MG tablet Take 500 mg by mouth 2 (two) times daily. 04/11/24  Yes [provider]  latanoprost  (XALATAN ) 0.005 % ophthalmic solution Place 1 drop into both eyes every evening. 06/29/23  Yes [provider]  meloxicam  (MOBIC ) 7.5 MG tablet TAKE 2 TABLETS BY MOUTH DAILY AS NEEDED FOR PAIN. TAKE ONCE YOU'VE COMPLETED STEROID PACK 11/23/19  Yes Jule Ronal CROME, PA-C  Multiple Vitamin (MULTIVITAMIN WITH MINERALS) TABS tablet Take 1 tablet by mouth daily.   Yes [provider]  mupirocin  cream (BACTROBAN ) 2 % Apply 1 Application topically 2 (two) times daily. Patient taking differently: Apply 1 Application topically daily as needed (rash). 01/16/23  Yes Garrison, [place]  N, FNP  rosuvastatin  (CRESTOR ) 10 MG tablet TAKE 1 TABLET(10 MG) BY MOUTH DAILY Patient taking differently: Take 10 mg by mouth every evening. 12/25/20  Yes Alvia Bring, DO  traMADol  (ULTRAM ) 50 MG tablet Take 1 tablet (50 mg total) by mouth  3 (three) times daily as needed. Patient taking differently: Take 50 mg by mouth 3 (three) times daily as needed for moderate pain (pain score 4-6). 08/14/20  Yes Jule Ronal CROME, PA-C  triamcinolone  cream (KENALOG ) 0.1 % Apply 1 Application topically 2 (two) times daily as needed (itching). Apply to arms, trunk. May apply sparingly to the neck. Do not apply to face 01/17/24  Yes Teresa Almarie LABOR, PA-C  valsartan (DIOVAN) 80 MG tablet Take 80 mg by mouth daily. 03/27/23  Yes [provider]  Vitamin D, Ergocalciferol, (DRISDOL) 1.25 MG (50000 UNIT) CAPS capsule Take 50,000 Units by mouth once a week. 03/28/23  Yes [provider]  Accu-Chek Softclix Lancets lancets Use to check glucose up to two times per day.  Dx: E11.9 11/09/19   Alvia Bring, DO  Blood Glucose Monitoring Suppl (ACCU-CHEK AVIVA PLUS) w/Device KIT Use to check glucose up to two times per day.  Dx: E11.9 11/09/19   Alvia Bring, DO  glucose blood (ACCU-CHEK AVIVA PLUS) test strip Use to check glucose up to two times per day.  Dx: E11.9 11/09/19   Alvia Bring, DO  sildenafil (VIAGRA) 100 MG tablet Take 100 mg by mouth daily as needed for erectile dysfunction. 06/01/23   [provider]    Allergies: Patient has no known allergies.    Review of Systems  Neurological:  Positive for weakness.    Updated Vital Signs BP (!) 153/114   Pulse (!) 49  Temp 97.9 F (36.6 C)   Resp 19   Ht 5' 9 (1.753 m)   Wt 84 kg   SpO2 100%   BMI 27.35 kg/m   Physical Exam Vitals and nursing note reviewed.  Constitutional:      General: He is not in acute distress.    Appearance: Normal appearance. He is not ill-appearing.  HENT:     Head: Normocephalic and atraumatic.  Eyes:     General: Lids are normal. Vision grossly intact. No visual field deficit.    Extraocular Movements: Extraocular movements intact.     Right eye: Normal extraocular motion and no nystagmus.     Left eye: Normal extraocular motion and  no nystagmus.     Conjunctiva/sclera: Conjunctivae normal.     Pupils: Pupils are equal, round, and reactive to light.  Cardiovascular:     Rate and Rhythm: Normal rate.     Heart sounds: Normal heart sounds.  Pulmonary:     Effort: Pulmonary effort is normal. No respiratory distress.     Breath sounds: Normal breath sounds.  Musculoskeletal:     Cervical back: Normal range of motion and neck supple. No rigidity.  Skin:    Coloration: Skin is not jaundiced or pale.  Neurological:     General: No focal deficit present.     Mental Status: He is alert and oriented to person, place, and time. Mental status is at baseline.     GCS: GCS eye subscore is 4. GCS verbal subscore is 5. GCS motor subscore is 6.     Cranial Nerves: No cranial nerve deficit, dysarthria or facial asymmetry.     Sensory: No sensory deficit.     Motor: Weakness present. No abnormal muscle tone or seizure activity.     Coordination: Coordination normal. Finger-Nose-Finger Test and Heel to [facility] Test normal.     Gait: Gait abnormal.     Deep Tendon Reflexes: Reflexes normal.     Comments: Barely able to lift leg against gravity when compared to right leg. Motor 4/5 of LUE, LLE. Motor 5/5 of RUE, RLE. Sensation 2/2 of BLE and BUE. Unsteady gait and favoring of right leg. No slurred speech     (all labs ordered are listed, but only abnormal results are displayed) Labs Reviewed  COMPREHENSIVE METABOLIC PANEL WITH GFR - Abnormal; Notable for the following components:      Result Value   Glucose, Bld 108 (*)    Calcium  8.6 (*)    Total Bilirubin 1.3 (*)    All other components within normal limits  CBC WITH DIFFERENTIAL/PLATELET  URINALYSIS, ROUTINE W REFLEX MICROSCOPIC  CK  CBC WITH DIFFERENTIAL/PLATELET  COMPREHENSIVE METABOLIC PANEL WITH GFR  MAGNESIUM  MAGNESIUM  LIPID PANEL  HEMOGLOBIN A1C  TSH  ETHANOL  RAPID URINE DRUG SCREEN, HOSP PERFORMED  CBG MONITORING, ED    EKG: EKG  Interpretation Date/Time:  Friday April 15 2024 12:24:41 EDT Ventricular Rate:  51 PR Interval:  173 QRS Duration:  86 QT Interval:  421 QTC Calculation: 388 R Axis:   50  Text Interpretation: Sinus rhythm Confirmed by Ula Barter [phone]) on 04/15/2024 12:33:40 PM  Radiology: MR BRAIN WO CONTRAST Result Date: 04/15/2024 CLINICAL DATA:  Provided history: Neuro deficit, acute, stroke suspected. EXAM: MRI HEAD WITHOUT CONTRAST TECHNIQUE: Multiplanar, multiecho pulse sequences of the brain and surrounding structures were obtained without intravenous contrast. COMPARISON:  Head CT 04/15/2024. Brain MRI 10/29/2019. CTA head/neck 04/15/2024. FINDINGS: Brain: No age-advanced or lobar predominant  cerebral atrophy. 8 mm acute infarct within the right aspect of the pons. Unchanged small chronic lacunar infarct within the left aspect of the pons. Background mild pontine chronic small vessel ischemic disease. Few punctate chronic microhemorrhages within the brainstem. Unchanged moderate-sized chronic infarct within the left cerebellar hemisphere. Unchanged chronic lacunar infarct within the right thalamus. Multifocal T2 FLAIR hyperintense signal abnormality within the cerebral white matter, nonspecific but compatible with moderate chronic small vessel ischemic disease. No evidence of an intracranial mass. No extra-axial fluid collection. No midline shift. Vascular: Known occlusion of the left vertebral artery. Flow voids preserved elsewhere within the proximal large arterial vessels. Skull and upper cervical spine: No focal worrisome marrow lesion. Sinuses/Orbits: No mass or acute finding within the imaged orbits. Prior left ocular lens replacement. Minimal mucosal thickening within the bilateral maxillary sinuses. IMPRESSION: 1. 8 mm acute infarct within the right aspect of the pons. 2. Background chronic small vessel ischemic disease and chronic infarcts, as described. 3. 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 4. Known occlusion of the left vertebral artery. Electronically Signed   By: Rockey Childs D.O.   On: 04/15/2024 16:47   CT Head Wo Contrast Result Date: 04/15/2024 EXAM: CT HEAD WITHOUT CONTRAST 04/15/2024 02:53:44 PM TECHNIQUE: CT of the head was performed without the administration of intravenous contrast. Automated exposure control, iterative reconstruction, and/or weight based adjustment of the mA/kV was utilized to reduce the radiation dose to as low as reasonably achievable. COMPARISON: CT head, CTA head and neck, MRI brain 10/29/2019 CLINICAL HISTORY: Neuro deficit, acute, stroke suspected. Chief complaints; Weakness. FINDINGS: BRAIN AND VENTRICLES: Focal hypoattenuation in the right pons seen on axial image 11 series 2 is new from the prior studies and may reflect an age indeterminate perforator infarct. Old infarct in the left cerebellar hemisphere. Stable background of moderate chronic small vessel disease in the cerebral white matter. Cortical gray white differentiation is otherwise preserved. No acute hemorrhage. No hydrocephalus. No extra-axial collection. No mass effect or midline shift. ORBITS: No acute abnormality. SINUSES: No acute abnormality. SOFT TISSUES AND SKULL: No acute soft tissue abnormality. No skull fracture. Severe degenerative changes of the right greater than left atlanto-occipital joints. IMPRESSION: 1. New focal hypoattenuation in the right pons, possibly representing an age indeterminate perforator infarct. Correlate with symptoms and consider MRI of the brain without contrast for further characterization. 2. Old infarct in the left cerebellar hemisphere. 3. Stable background of moderate chronic small vessel disease in the cerebral white matter. Electronically signed by: Ryan Chess MD 04/15/2024 03:16 PM EDT RP Workstation: [id number]     .Ultrasound ED Peripheral IV (Provider)  Date/Time: 04/15/2024  2:37 PM  Performed by: Minnie Tinnie BRAVO, PA Authorized by: Minnie Tinnie BRAVO, PA   Procedure details:    Indications: multiple failed IV attempts and poor IV access     Skin Prep: isopropyl alcohol      Location:  Right AC   Angiocath:  20 G   Bedside Ultrasound Guided: Yes     Images: archived     Patient tolerated procedure without complications: Yes     Dressing applied: Yes   Comments:     Vein visualized with US  guidance and easily compressible, nonpulsatile. .Critical Care  Performed by: Minnie Tinnie BRAVO, PA Authorized by: Minnie Tinnie BRAVO, PA   Critical care provider statement:    Critical care time (minutes):  35   Critical care time was exclusive of:  Separately billable procedures  and treating other patients   Critical care was necessary to treat or prevent imminent or life-threatening deterioration of the following conditions: CVA.   Critical care was time spent personally by me on the following activities:  Blood draw for specimens, development of treatment plan with patient or surrogate, discussions with consultants, evaluation of patient's response to treatment, examination of patient, obtaining history from patient or surrogate, ordering and performing treatments and interventions, ordering and review of laboratory studies and re-evaluation of patient's condition   Care discussed with: admitting provider      Medications Ordered in the ED  acetaminophen  (TYLENOL ) tablet 650 mg (has no administration in time range)    Or  acetaminophen  (TYLENOL ) suppository 650 mg (has no administration in time range)  melatonin tablet 3 mg (has no administration in time range)  ondansetron  (ZOFRAN ) injection 4 mg (has no administration in time range)   stroke: early stages of recovery book (has no administration in time range)  clopidogrel  (PLAVIX ) tablet 300 mg (has no administration in time range)    And  clopidogrel  (PLAVIX ) tablet 75 mg (has no administration in time range)   latanoprost  (XALATAN ) 0.005 % ophthalmic solution 1 drop (has no administration in time range)  rosuvastatin  (CRESTOR ) tablet 10 mg (has no administration in time range)  irbesartan  (AVAPRO ) tablet 75 mg (has no administration in time range)  insulin  aspart (novoLOG ) injection 0-9 Units (has no administration in time range)  insulin  aspart (novoLOG ) injection 0-5 Units (has no administration in time range)                                    Medical Decision Making Amount and/or Complexity of Data Reviewed Labs: ordered. Radiology: ordered.  Risk Decision regarding hospitalization.   Patient presents to the ED for concern of LLE weakness, this involves an extensive number of treatment options, and is a complaint that carries with it a high risk of complications and morbidity.  The differential diagnosis includes fracture, infection, electrolyte abnormality, CVA/TIA, OSA, UTI   Co morbidities that complicate the patient evaluation  CVA   Additional history obtained:  Additional history obtained from Nursing   External records from outside source obtained and reviewed including triage RN note   Lab Tests:  I Ordered, and personally interpreted labs.  The pertinent results include:   CBG 108 Calcium  8.6   Imaging Studies ordered:  I ordered imaging studies including CT head, MRI brain without contrast I independently visualized and interpreted imaging which showed  8 mm acute infarct within the right aspect of the pons. Background chronic small vessel ischemic disease and chronic infarcts, as described. 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 Known occlusion of the left vertebral artery I agree with the radiologist interpretation   Cardiac Monitoring:  The patient was maintained on a cardiac monitor.  I personally viewed and interpreted the cardiac monitored which showed an underlying  rhythm of: NSR at 51 bpm with no ST nor T wave abnormalities   Consults obtained  Discussed ED workup, dispo with neurology Dr. Michaela who individually assessed patient and agrees with plan Discussed ED workup, dispo with hospitalist Dr. However.  He accepts patient for admission    Problem List / ED Course:  Left-sided weakness CVA Past medical history of CVA with no previous deficits Noticeably weak with straight leg raise of left leg when compared  to right leg.  No obvious weakness with dorsi and plantarflexion.  No sensory deficit to BUE nor BLE Lab work unremarkable.  No hypoglycemia nor significant electrolyte abnormality.  No obvious signs of UTI, infection. Imaging shows 8 mm acute infarct in the right aspect of the pons Neurology Dr. Michaela individually assessed patient.  We appreciate his consult.  Will have him admitted for further workup per his recommendation   Reevaluation:  After the interventions noted above, I reevaluated the patient and found that they have :stayed the same   Social Determinants of Health:  Lives on his own - would likely benefit from PT/OT   Dispostion:  After consideration of the diagnostic results and the patients response to treatment, I feel that the patent would benefit from admission for CVA workup.   Discussed ED workup, disposition, return to ED precautions with patient who expresses understanding agrees with plan.  All questions answered to their satisfaction.  They are agreeable to plan.  Discharge instructions provided on paperwork  Final diagnoses:  Cerebrovascular accident (CVA), unspecified mechanism (HCC)  Left-sided weakness    ED Discharge Orders     None        Minnie Tinnie BRAVO, PA 04/15/24 2100    Ula Prentice SAUNDERS, MD 04/25/24 1440

## 2024-04-15 NOTE — H&P (Addendum)
 History and Physical      Aaron Mendez FMW:[id number] DOB: May 28, 1946 DOA: 04/15/2024; DOS: 04/15/2024  PCP: Waylan Almarie SAUNDERS, MD  Patient coming from: home   I have personally briefly reviewed patient's old medical records in [facility]  Chief Complaint: Left lower extremity weakness  HPI: Aaron Mendez is a 78 y.o. 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 on 04/15/2024 with acute ischemic stroke after presenting from home to [facility] ED complaining of left lower extremity weakness.   The patient reports development of left lower extremity weakness 1 week ago, which has been persistent since that time.  Otherwise, denies any additional acute focal weakness nor any recent acute focal numbness/paresthesias.  Denies any associated acute change in vision, facial droop, vertigo, dysarthria, expressive aphasia, or dysphagia.   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   He also has a history of chronic sinus bradycardia, with chart review yielding heart rates persistently in the 50s to low 60s dating back to at least May 2024.  Not on any AV nodal blocking agents at home.  Denies any recent dizziness, presyncope, syncope.  Pleasant outpatient, he is on a daily full dose aspirin  as well as Crestor  10 mg p.o. daily.  While he has a history of type 2 diabetes mellitus, it appears that this is manage for last on modifications in the absence of any outpatient use of insulin  or oral hypoglycemic agents.  Denies any known history of paroxysmal atrial fibrillation, and conveys that he is a lifelong non-smoker.     ED Course:  Vital signs in the ED were notable for the following: Afebrile; heart rates in the mid 40s to 60s; systolic blood  pressures in the 140s to 150; respiratory rate 14-22, oxygen saturation 99 to 100% on room air.  Labs were notable for the following: CMP was notable for the following: Sodium 141, potassium 4.0, bicarbonate 24, creatinine 1.04, glucose 108, calcium  adjusted for mild hypokalemia 98.7, avidin 3.9, total bilirubin 1.3.  Otherwise, liver enzymes were within normal limits.  Total CPK 165.  CBC notable for evidence of We will 6300.  Urinalysis showed no evidence of white blood cells and was leukocyte esterase/nitrate negative.  Per my interpretation, EKG in ED demonstrated the following: Sinus bradycardia with heart rate 51, normal intervals, no evidence of T wave or ST changes, including no evidence of ST elevation.  Imaging in the ED, per corresponding formal radiology read, was notable for the following: Relative to CT head, CTA head and neck and MRI brain all performed on 10/29/2019, today's CT head shows new focal hypoattenuation in the right pons, potentially representing age-indeterminate infarct, will demonstrate no evidence of intracranial hemorrhage.  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   EDP has d/w on-call neurology, Dr. Michaela, who recommends [facility] admission for further stroke evaluation, including assessment of modifiable ischemic cva risk factors, and conveys that neurology will formally consult.   While in the ED, the following were administered: No medications or IV fluids were administered in the ED this evening.  Subsequently, the patient was admitted for  further evaluation management of presenting acute ischemic stroke.    Review of Systems: As per HPI otherwise 10 point review of systems negative.   Past Medical History:  Diagnosis  Date   Bilateral primary osteoarthritis of knee    Hypertension    Peripheral artery disease (HCC)    Sickle cell trait (HCC)     No past surgical history on file.  Social History:  reports that he has never smoked. He has never used smokeless tobacco. He reports that he does not drink alcohol  and does not use drugs.   No Known Allergies  Family History  Problem Relation Age of Onset   Sickle cell anemia Son     Family history reviewed and not pertinent    Prior to Admission medications   Medication Sig Start Date End Date Taking? Authorizing Provider  Accu-Chek Softclix Lancets lancets Use to check glucose up to two times per day.  Dx: E11.9 11/09/19   Alvia Bring, DO  aspirin  EC 325 MG tablet TAKE 1 TABLET(325 MG) BY MOUTH DAILY 05/28/20   Alvia Bring, DO  Blood Glucose Monitoring Suppl (ACCU-CHEK AVIVA PLUS) w/Device KIT Use to check glucose up to two times per day.  Dx: E11.9 11/09/19   Alvia Bring, DO  clopidogrel  (PLAVIX ) 75 MG tablet TAKE 1 TABLET(75 MG) BY MOUTH DAILY 12/25/20   Alvia Bring, DO  glucose blood (ACCU-CHEK AVIVA PLUS) test strip Use to check glucose up to two times per day.  Dx: E11.9 11/09/19   Alvia Bring, DO  latanoprost  (XALATAN ) 0.005 % ophthalmic solution SMARTSIG:1 Drop(s) In Eye(s) Every Evening 06/29/23   [provider]  meloxicam  (MOBIC ) 7.5 MG tablet TAKE 2 TABLETS BY MOUTH DAILY AS NEEDED FOR PAIN. TAKE ONCE YOU'VE COMPLETED STEROID PACK 11/23/19   Jule Ronal CROME, PA-C  metFORMIN  (GLUCOPHAGE ) 500 MG tablet TAKE 1 TABLET(500 MG) BY MOUTH DAILY WITH BREAKFAST Patient not taking: Reported on 08/27/2023 08/06/20   Alvia Bring, DO  methocarbamol  (ROBAXIN ) 500 MG tablet Take 1 tablet (500 mg total) by mouth 2 (two) times daily as needed. 04/10/20   Jule Ronal CROME, PA-C  Multiple Vitamin (MULTIVITAMIN WITH MINERALS) TABS tablet Take 1 tablet by mouth daily.    [provider]  mupirocin  cream (BACTROBAN ) 2 % Apply 1  Application topically 2 (two) times daily. 01/16/23   Dreama, [place]  N, FNP  rosuvastatin  (CRESTOR ) 10 MG tablet TAKE 1 TABLET(10 MG) BY MOUTH DAILY 12/25/20   Alvia Bring, DO  sildenafil (VIAGRA) 100 MG tablet Take 100 mg by mouth daily as needed. 06/01/23   [provider]  traMADol  (ULTRAM ) 50 MG tablet Take 1 tablet (50 mg total) by mouth 3 (three) times daily as needed. 08/14/20   Jule Ronal CROME, PA-C  triamcinolone  cream (KENALOG ) 0.1 % Apply 1 Application topically 2 (two) times daily as needed (itching). Apply to arms, trunk. May apply sparingly to the neck. Do not apply to face 01/17/24   Teresa Almarie LABOR, PA-C  valsartan (DIOVAN) 80 MG tablet Take 80 mg by mouth daily. 03/27/23   [provider]  Vitamin D, Ergocalciferol, (DRISDOL) 1.25 MG (50000 UNIT) CAPS capsule Take 50,000 Units by mouth once a week. 03/28/23   [provider]     Objective    Physical Exam: Vitals:   04/15/24 1715 04/15/24 1730 04/15/24 1745 04/15/24 1856  BP: (!) 156/114 (!) 153/76  (!) 153/114  Pulse: (!) 50 (!) 49 (!) 49 (!) 49  Resp:  19  Temp:    97.9 F (36.6 C)  TempSrc:      SpO2: 100% 100% 100% 100%  Weight:      Height:        General: appears to be stated age; alert, oriented Skin: warm, dry, no rash Head:  AT/[place] Mouth:  Oral mucosa membranes appear moist, normal dentition Neck: supple; trachea midline Heart: Bradycardic, regular; did not appreciate any M/R/G Lungs: CTAB, did not appreciate any wheezes, rales, or rhonchi Abdomen: + BS; soft, ND, NT Vascular: 2+ pedal pulses b/l; 2+ radial pulses b/l Extremities: no peripheral edema, no muscle wasting Neuro:sensation intact in upper and lower extremities b/l; 4/5 strength in the LLE; 5/5 strength in the right lower extremity as well as the bilateral upper extremities; no tremors    Labs on Admission: I have personally reviewed following labs and imaging studies  CBC: Recent Labs  Lab 04/15/24 1215   WBC 6.3  NEUTROABS 3.9  HGB 13.7  HCT 43.7  MCV 91.6  PLT 206   Basic Metabolic Panel: Recent Labs  Lab 04/15/24 1215  NA 141  K 4.0  CL 108  CO2 24  GLUCOSE 108*  BUN 8  CREATININE 1.04  CALCIUM  8.6*   GFR: Estimated Creatinine Clearance: 57.6 mL/min (by C-G formula based on SCr of 1.04 mg/dL). Liver Function Tests: Recent Labs  Lab 04/15/24 1215  AST 20  ALT 19  ALKPHOS 42  BILITOT 1.3*  PROT 6.9  ALBUMIN 3.9   No results for input(s): LIPASE, AMYLASE in the last 168 hours. No results for input(s): AMMONIA in the last 168 hours. Coagulation Profile: No results for input(s): INR, PROTIME in the last 168 hours. Cardiac Enzymes: Recent Labs  Lab 04/15/24 1215  CKTOTAL 165   BNP (last 3 results) No results for input(s): PROBNP in the last 8760 hours. HbA1C: No results for input(s): HGBA1C in the last 72 hours. CBG: Recent Labs  Lab 04/15/24 1454  GLUCAP 83   Lipid Profile: No results for input(s): CHOL, HDL, LDLCALC, TRIG, CHOLHDL, LDLDIRECT in the last 72 hours. Thyroid  Function Tests: No results for input(s): TSH, T4TOTAL, FREET4, T3FREE, THYROIDAB in the last 72 hours. Anemia Panel: No results for input(s): VITAMINB12, FOLATE, FERRITIN, TIBC, IRON, RETICCTPCT in the last 72 hours. Urine analysis:    Component Value Date/Time   COLORURINE YELLOW 04/15/2024 1215   APPEARANCEUR CLEAR 04/15/2024 1215   LABSPEC 1.012 04/15/2024 1215   PHURINE 6.0 04/15/2024 1215   GLUCOSEU NEGATIVE 04/15/2024 1215   HGBUR NEGATIVE 04/15/2024 1215   BILIRUBINUR NEGATIVE 04/15/2024 1215   BILIRUBINUR negative 12/28/2019 1550   KETONESUR NEGATIVE 04/15/2024 1215   PROTEINUR NEGATIVE 04/15/2024 1215   UROBILINOGEN 1.0 12/28/2019 1550   NITRITE NEGATIVE 04/15/2024 1215   LEUKOCYTESUR NEGATIVE 04/15/2024 1215    Radiological Exams on Admission: MR BRAIN WO CONTRAST Result Date: 04/15/2024 CLINICAL DATA:  Provided  history: Neuro deficit, acute, stroke suspected. EXAM: MRI HEAD WITHOUT CONTRAST TECHNIQUE: Multiplanar, multiecho pulse sequences of the brain and surrounding structures were obtained without intravenous contrast. COMPARISON:  Head CT 04/15/2024. Brain MRI 10/29/2019. CTA head/neck 04/15/2024. FINDINGS: Brain: No age-advanced or lobar predominant cerebral atrophy. 8 mm acute infarct within the right aspect of the pons. Unchanged small chronic lacunar infarct within the left aspect of the pons. Background mild pontine chronic small vessel ischemic disease. Few punctate chronic microhemorrhages within the brainstem. Unchanged moderate-sized chronic infarct within the left cerebellar hemisphere. Unchanged chronic lacunar infarct within the right thalamus.  Multifocal T2 FLAIR hyperintense signal abnormality within the cerebral white matter, nonspecific but compatible with moderate chronic small vessel ischemic disease. No evidence of an intracranial mass. No extra-axial fluid collection. No midline shift. Vascular: Known occlusion of the left vertebral artery. Flow voids preserved elsewhere within the proximal large arterial vessels. Skull and upper cervical spine: No focal worrisome marrow lesion. Sinuses/Orbits: No mass or acute finding within the imaged orbits. Prior left ocular lens replacement. Minimal mucosal thickening within the bilateral maxillary sinuses. IMPRESSION: 1. 8 mm acute infarct within the right aspect of the pons. 2. Background chronic small vessel ischemic disease and chronic infarcts, as described. 3. 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 4. Known occlusion of the left vertebral artery. Electronically Signed   By: Rockey Childs D.O.   On: 04/15/2024 16:47   CT Head Wo Contrast Result Date: 04/15/2024 EXAM: CT HEAD WITHOUT CONTRAST 04/15/2024 02:53:44 PM TECHNIQUE: CT of the head was performed without the  administration of intravenous contrast. Automated exposure control, iterative reconstruction, and/or weight based adjustment of the mA/kV was utilized to reduce the radiation dose to as low as reasonably achievable. COMPARISON: CT head, CTA head and neck, MRI brain 10/29/2019 CLINICAL HISTORY: Neuro deficit, acute, stroke suspected. Chief complaints; Weakness. FINDINGS: BRAIN AND VENTRICLES: Focal hypoattenuation in the right pons seen on axial image 11 series 2 is new from the prior studies and may reflect an age indeterminate perforator infarct. Old infarct in the left cerebellar hemisphere. Stable background of moderate chronic small vessel disease in the cerebral white matter. Cortical gray white differentiation is otherwise preserved. No acute hemorrhage. No hydrocephalus. No extra-axial collection. No mass effect or midline shift. ORBITS: No acute abnormality. SINUSES: No acute abnormality. SOFT TISSUES AND SKULL: No acute soft tissue abnormality. No skull fracture. Severe degenerative changes of the right greater than left atlanto-occipital joints. IMPRESSION: 1. New focal hypoattenuation in the right pons, possibly representing an age indeterminate perforator infarct. Correlate with symptoms and consider MRI of the brain without contrast for further characterization. 2. Old infarct in the left cerebellar hemisphere. 3. Stable background of moderate chronic small vessel disease in the cerebral white matter. Electronically signed by: Ryan Chess MD 04/15/2024 03:16 PM EDT RP Workstation: [id number]      Assessment/Plan   Principal Problem:   Acute ischemic stroke (HCC) Active Problems:   DM2 (diabetes mellitus, type 2) (HCC)   Weakness of left lower extremity   Chronic sinus bradycardia   History of essential hypertension      #) Acute ischemic CVA: In the setting of new left lower extremity weakness, which has been persistent since onset 1 week ago, with MRI brain showing evidence of 8  mm acute infarct within the right pons.  As noted above, CT head shows no evidence of acute intracranial hemorrhage.  As his last known well was 1 week ago, he presents outside of the window for consideration for tPA or thrombectomy.  EDP has d/w on-call neurology, Dr. Michaela, who recommends [facility] admission for further stroke evaluation, including assessment of modifiable ischemic cva risk factors, and conveys that neurology will formally consult.  Will follow for neurology's recommendations regarding need for CTA imaging as well as neurology's recommendations regarding antiplatelet intervention, noting that the patient is on a full dose aspirin  daily as an outpatient.   His known modifiable ischemic CVA risk factors include a history of type 2 diabetes mellitus, essential hypertension.  Will pursue further assessment of  potentially modifiable ischemic CVA risk factors, as outlined below.  Given timing of last known well, he also presents outside of the timeframe for observance of permissive hypertension.  Plan: Nursing bedside swallow evaluation x 1 now, and will not initiate oral medications or diet until the patient has passed this. Head of the bed at 30 degrees. Neuro checks per protocol. VS per protocol. Monitor on telemetry, including monitoring for atrial fibrillation as modifiable risk factor for acute ischemic CVA.   TTE without bubble study has been ordered for the morning. Additionally, as component of evaluation of potential modifiable ischemic CVA risk factors, will also check lipid panel and A1c. PT/OT consults have been ordered for the morning. Additionally, as a standard component of work-up for acute ischemic cva, I've also ordered a speech therapy consult for the morning, which was also a component of the formal recommendations conveyed and documented via neurology consult by Dr. Michaela.  Formal Neurology consult, as above, and will follow for their recommendations regarding  antiplatelet intervention as well as recommendations for CTA imaging.                       # (Chronic sinus bradycardia: Documented history of such, with presenting heart rate consistent with his baseline mild sinus bradycardia.  He appears asymptomatic with these heart rates, and also appears hemodynamically stable.  Not on any AV nodal blocking agents as an outpatient.  Unclear if his chronic microhemorrhages within the brainstem are contributory.  No evidence of contributory electrolyte abnormalities at this time.  Plan: Monitor on symmetry.  Check magnesium level, TSH, urinary drug screen, serum ethanol level.  Follow-up for result of echocardiogram ordered for the morning is competitive evaluation of presenting acute ischemic CVA.  Repeat CMP in the morning.                     #) Essential Hypertension: documented h/o such, with outpatient antihypertensive regimen including valsartan.  SBP's in the ED today: 140s 150s mmHg. given that his last known well relative to his presenting acute ischemic CVA is 1 week ago, he presents outside of the window for observance of permissive hypertension.  Plan: Close monitoring of subsequent BP via routine VS. resuming valsartan.                      #) Type 2 Diabetes Mellitus: documented history of such.  Appears to be managed via lifestyle modifications in the absence of any outpatient use of insulin  or oral hypoglycemic agents.  Most recent prior hemoglobin A1c level was 8.3% when checked in February 2021.  Will check updated hemoglobin A1c level as component of evaluation for modifiable ischemic CVA risk factors .  Presenting blood sugar noted to be 108.  Plan: accuchecks QAC and HS with low dose SSI.  Add on hemoglobin A1c level.      DVT prophylaxis: SCD's   Code Status: Full code Family Communication: none Disposition Plan: Per Rounding Team Consults called: EDP has d/w on-call  neurology, Dr. Michaela, who recommends [facility] admission for further stroke evaluation, including assessment of modifiable ischemic cva risk factors, and conveys that neurology will formally consult. ;  Admission status: Observation     I SPENT GREATER THAN 75  MINUTES IN CLINICAL CARE TIME/MEDICAL DECISION-MAKING IN COMPLETING THIS ADMISSION.      Eva NOVAK Ravyn Nikkel DO [facility]  From 7PM - 7AM   04/15/2024, 7:35 PM

## 2024-04-15 NOTE — ED Triage Notes (Addendum)
 Pt reports hx frequent falls this week -- reports his LLE just twists and is weak, causing him to fall on both Mon & Tues this week. Denies any head injuries. Denies any dizziness with falls, just states he has a weakness in his LLE. Denies weakness in LUE. Pt started using a cane this week for assistance in ambulation. Has a very slow and unsteady gate with LLE. C/O pain in left knee from falls. Went to PCP Mon, Tue, & Wed (went for UTI injections and physical, and discussed falls)-- was told to apply ice and return in 10 days.

## 2024-04-15 NOTE — ED Notes (Signed)
[facility] at bedside for transport. 

## 2024-04-15 NOTE — ED Notes (Signed)
 This RN stuck patient once for IV access, was unsuccessful. Patient refusing to be stuck again.

## 2024-04-15 NOTE — ED Triage Notes (Signed)
 Pt to ED [facility] from [facility] for lower extremity weakness. Pt c/o pain to left knee--was told by PCP to ice and reevaluate 10 days later. Pt has hx several mechanical falls this week. Pt denies hitting head/LOC. Pt recently seen for UTI and given antibiotics. Pt A&Ox4.

## 2024-04-15 NOTE — ED Notes (Signed)
 Called report to [employer] at [facility] ED.

## 2024-04-15 NOTE — Consult Note (Signed)
 NEUROLOGY CONSULT NOTE   Date of service: April 15, 2024 Patient Name: Aaron Mendez MRN:  [id number] DOB:  May 13, 1946 Chief Complaint: leg weakness Requesting Provider: Ula Prentice SAUNDERS, MD  History of Present Illness  [name] is a 78 y.o. male with hx of htn, PAD who presents with 1 week of left leg weakness.  He states that it started relatively abruptly.  He has been getting around with a cane at home.  Due to this he sought care at urgent care today who referred him to the emergency department to get an MRI which reveals a sizable pontine stroke.  He denies any double vision, sensation changes, nausea or vomiting, vertigo, or other symptoms other than weakness.  LKW: 1 week ago IV Thrombolysis: No, outside of window EVT: No, outside of window  NIHSS components Score: Comment  1a Level of Conscious 0[]  1[]  2[]  3[]      1b LOC Questions 0[]  1[]  2[]       1c LOC Commands 0[]  1[]  2[]       2 Best Gaze 0[]  1[]  2[]       3 Visual 0[]  1[]  2[]  3[]      4 Facial Palsy 0[]  1[]  2[]  3[]      5a Motor Arm - left 0[]  1[x]  2[]  3[]  4[]  UN[]    5b Motor Arm - Right 0[]  1[]  2[]  3[]  4[]  UN[]    6a Motor Leg - Left 0[]  1[x]  2[]  3[]  4[]  UN[]    6b Motor Leg - Right 0[]  1[]  2[]  3[]  4[]  UN[]    7 Limb Ataxia 0[]  1[]  2[]  UN[]      8 Sensory 0[]  1[]  2[]  UN[]      9 Best Language 0[]  1[]  2[]  3[]      10 Dysarthria 0[]  1[x]  2[]  UN[]      11 Extinct. and Inattention 0[]  1[]  2[]       TOTAL: 3      Past History   Past Medical History:  Diagnosis Date   Bilateral primary osteoarthritis of knee    Hypertension    Peripheral artery disease (HCC)    Sickle cell trait (HCC)     No past surgical history on file.  Family History: Family History  Problem Relation Age of Onset   Sickle cell anemia Son     Social History  reports that he has never smoked. He has never used smokeless tobacco. He reports that he does not drink alcohol  and does not use drugs.  No Known Allergies  Medications  No current  facility-administered medications for this encounter.  Current Outpatient Medications:    Accu-Chek Softclix Lancets lancets, Use to check glucose up to two times per day.  Dx: E11.9, Disp: 100 each, Rfl: 12   aspirin  EC 325 MG tablet, TAKE 1 TABLET(325 MG) BY MOUTH DAILY, Disp: 90 tablet, Rfl: 2   Blood Glucose Monitoring Suppl (ACCU-CHEK AVIVA PLUS) w/Device KIT, Use to check glucose up to two times per day.  Dx: E11.9, Disp: 1 kit, Rfl: 0   clopidogrel  (PLAVIX ) 75 MG tablet, TAKE 1 TABLET(75 MG) BY MOUTH DAILY, Disp: 90 tablet, Rfl: 1   glucose blood (ACCU-CHEK AVIVA PLUS) test strip, Use to check glucose up to two times per day.  Dx: E11.9, Disp: 100 each, Rfl: 12   latanoprost  (XALATAN ) 0.005 % ophthalmic solution, SMARTSIG:1 Drop(s) In Eye(s) Every Evening, Disp: , Rfl:    meloxicam  (MOBIC ) 7.5 MG tablet, TAKE 2 TABLETS BY MOUTH DAILY AS NEEDED FOR PAIN. TAKE ONCE YOU'VE COMPLETED STEROID PACK, Disp: 180 tablet,  Rfl: 2   metFORMIN  (GLUCOPHAGE ) 500 MG tablet, TAKE 1 TABLET(500 MG) BY MOUTH DAILY WITH BREAKFAST (Patient not taking: Reported on 08/27/2023), Disp: 90 tablet, Rfl: 0   methocarbamol  (ROBAXIN ) 500 MG tablet, Take 1 tablet (500 mg total) by mouth 2 (two) times daily as needed., Disp: 20 tablet, Rfl: 0   Multiple Vitamin (MULTIVITAMIN WITH MINERALS) TABS tablet, Take 1 tablet by mouth daily., Disp: , Rfl:    mupirocin  cream (BACTROBAN ) 2 %, Apply 1 Application topically 2 (two) times daily., Disp: 15 g, Rfl: 0   rosuvastatin  (CRESTOR ) 10 MG tablet, TAKE 1 TABLET(10 MG) BY MOUTH DAILY, Disp: 90 tablet, Rfl: 1   sildenafil (VIAGRA) 100 MG tablet, Take 100 mg by mouth daily as needed., Disp: , Rfl:    traMADol  (ULTRAM ) 50 MG tablet, Take 1 tablet (50 mg total) by mouth 3 (three) times daily as needed., Disp: 30 tablet, Rfl: 0   triamcinolone  cream (KENALOG ) 0.1 %, Apply 1 Application topically 2 (two) times daily as needed (itching). Apply to arms, trunk. May apply sparingly to the neck.  Do not apply to face, Disp: 80 g, Rfl: 0   valsartan (DIOVAN) 80 MG tablet, Take 80 mg by mouth daily., Disp: , Rfl:    Vitamin D, Ergocalciferol, (DRISDOL) 1.25 MG (50000 UNIT) CAPS capsule, Take 50,000 Units by mouth once a week., Disp: , Rfl:   Vitals   Vitals:   15-May-2024 1715 2024/05/15 1730 05/15/24 1745 May 15, 2024 1856  BP: (!) 156/114 (!) 153/76  (!) 153/114  Pulse: (!) 50 (!) 49 (!) 49 (!) 49  Resp:    19  Temp:    97.9 F (36.6 C)  TempSrc:      SpO2: 100% 100% 100% 100%  Weight:      Height:        Body mass index is 27.35 kg/m.   Physical Exam   Constitutional: Appears well-developed and well-nourished.   Neurologic Examination    Neuro: Mental Status: Patient is awake, alert, oriented to person, place, month, year, and situation. Patient is able to give a clear and coherent history. No signs of aphasia or neglect Cranial Nerves: II: Visual Fields are full. Pupils are equal, round, and reactive to light.   III,IV, VI: EOMI without ptosis or diploplia.  V: Facial sensation is symmetric to temperature VII: Facial movement is symmetric.  I suspect that he has a very mild dysarthria VIII: hearing is intact to voice X: Uvula elevates symmetrically XII: tongue is midline without atrophy or fasciculations.  Motor: Tone is normal. Bulk is normal. 5/5 strength was present on the right, on the left he has 4/5 weakness in the left arm and 4 -/5 weakness in the left leg Sensory: Sensation is symmetric to light touch and temperature in the arms and legs. Cerebellar: FNF and HKS are consistent with weakness on the left        Labs/Imaging/Neurodiagnostic studies   CBC:  Recent Labs  Lab 2024/05/15 1215  WBC 6.3  NEUTROABS 3.9  HGB 13.7  HCT 43.7  MCV 91.6  PLT 206   Basic Metabolic Panel:  Lab Results  Component Value Date   NA 141 2024/05/15   K 4.0 May 15, 2024   CO2 24 15-May-2024   GLUCOSE 108 (H) 05/15/24   BUN 8 05-15-24   CREATININE 1.04  2024-05-15   CALCIUM  8.6 (L) 15-May-2024   GFRNONAA >60 05/15/24   GFRAA 104 12/28/2019   Lipid Panel:  Lab Results  Component Value  Date   LDLCALC 56 10/29/2019   HgbA1c:  Lab Results  Component Value Date   HGBA1C 8.3 (H) 10/29/2019   Urine Drug Screen:     Component Value Date/Time   LABOPIA NONE DETECTED 10/30/2019 0145   COCAINSCRNUR NONE DETECTED 10/30/2019 0145   LABBENZ NONE DETECTED 10/30/2019 0145   AMPHETMU NONE DETECTED 10/30/2019 0145   THCU NONE DETECTED 10/30/2019 0145   LABBARB NONE DETECTED 10/30/2019 0145    MRI Brain(Personally reviewed): Sizable pontine stroke  ASSESSMENT   Evo Aderman is a 78 y.o. 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  He will need to be evaluated for secondary risk factor modification as well as admitted for therapy evaluations.  RECOMMENDATIONS  - HgbA1c, fasting lipid panel - Frequent neuro checks - Echocardiogram - CTA head and neck - Prophylactic therapy-Antiplatelet med: Aspirin  - dose 81mg  and plavix  75mg  daily after 300mg  load  - Risk factor modification - Telemetry monitoring - PT consult, OT consult, Speech consult - Stroke team to follow  ______________________________________________________________________    Signed, Aisha Seals, MD [facility]

## 2024-04-16 ENCOUNTER — Observation Stay (HOSPITAL_BASED_OUTPATIENT_CLINIC_OR_DEPARTMENT_OTHER)

## 2024-04-16 DIAGNOSIS — R001 Bradycardia, unspecified: Secondary | ICD-10-CM | POA: Diagnosis present

## 2024-04-16 DIAGNOSIS — I6381 Other cerebral infarction due to occlusion or stenosis of small artery: Secondary | ICD-10-CM | POA: Diagnosis not present

## 2024-04-16 DIAGNOSIS — R29898 Other symptoms and signs involving the musculoskeletal system: Secondary | ICD-10-CM | POA: Diagnosis present

## 2024-04-16 DIAGNOSIS — I69391 Dysphagia following cerebral infarction: Secondary | ICD-10-CM | POA: Diagnosis not present

## 2024-04-16 DIAGNOSIS — Z8679 Personal history of other diseases of the circulatory system: Secondary | ICD-10-CM

## 2024-04-16 DIAGNOSIS — E119 Type 2 diabetes mellitus without complications: Secondary | ICD-10-CM | POA: Diagnosis not present

## 2024-04-16 DIAGNOSIS — I639 Cerebral infarction, unspecified: Secondary | ICD-10-CM | POA: Diagnosis not present

## 2024-04-16 DIAGNOSIS — R29702 NIHSS score 2: Secondary | ICD-10-CM

## 2024-04-16 DIAGNOSIS — I6389 Other cerebral infarction: Secondary | ICD-10-CM

## 2024-04-16 DIAGNOSIS — I739 Peripheral vascular disease, unspecified: Secondary | ICD-10-CM

## 2024-04-16 DIAGNOSIS — G894 Chronic pain syndrome: Secondary | ICD-10-CM | POA: Diagnosis not present

## 2024-04-16 DIAGNOSIS — M1990 Unspecified osteoarthritis, unspecified site: Secondary | ICD-10-CM | POA: Diagnosis not present

## 2024-04-16 DIAGNOSIS — Z7982 Long term (current) use of aspirin: Secondary | ICD-10-CM

## 2024-04-16 DIAGNOSIS — Z6827 Body mass index (BMI) 27.0-27.9, adult: Secondary | ICD-10-CM | POA: Diagnosis not present

## 2024-04-16 DIAGNOSIS — I10 Essential (primary) hypertension: Secondary | ICD-10-CM | POA: Diagnosis not present

## 2024-04-16 DIAGNOSIS — R2681 Unsteadiness on feet: Secondary | ICD-10-CM | POA: Diagnosis not present

## 2024-04-16 LAB — CBC WITH DIFFERENTIAL/PLATELET
Abs Immature Granulocytes: 0.01 K/uL (ref 0.00–0.07)
Basophils Absolute: 0 K/uL (ref 0.0–0.1)
Basophils Relative: 1 %
Eosinophils Absolute: 0.2 K/uL (ref 0.0–0.5)
Eosinophils Relative: 4 %
HCT: 42.9 % (ref 39.0–52.0)
Hemoglobin: 13.6 g/dL (ref 13.0–17.0)
Immature Granulocytes: 0 %
Lymphocytes Relative: 25 %
Lymphs Abs: 1.4 K/uL (ref 0.7–4.0)
MCH: 28.6 pg (ref 26.0–34.0)
MCHC: 31.7 g/dL (ref 30.0–36.0)
MCV: 90.3 fL (ref 80.0–100.0)
Monocytes Absolute: 0.8 K/uL (ref 0.1–1.0)
Monocytes Relative: 15 %
Neutro Abs: 3 K/uL (ref 1.7–7.7)
Neutrophils Relative %: 55 %
Platelets: 198 K/uL (ref 150–400)
RBC: 4.75 MIL/uL (ref 4.22–5.81)
RDW: 12.3 % (ref 11.5–15.5)
WBC: 5.5 K/uL (ref 4.0–10.5)
nRBC: 0 % (ref 0.0–0.2)

## 2024-04-16 LAB — ECHOCARDIOGRAM COMPLETE
AR max vel: 2.84 cm2
AV Area VTI: 3.35 cm2
AV Area mean vel: 3.06 cm2
AV Mean grad: 1.9 mmHg
AV Peak grad: 4.8 mmHg
Ao pk vel: 1.1 m/s
Area-P 1/2: 3.34 cm2
Height: 69 in
S' Lateral: 3.6 cm
Single Plane A2C EF: 50.2 %
Weight: 2962.98 [oz_av]

## 2024-04-16 LAB — GLUCOSE, CAPILLARY
Glucose-Capillary: 105 mg/dL — ABNORMAL HIGH (ref 70–99)
Glucose-Capillary: 141 mg/dL — ABNORMAL HIGH (ref 70–99)
Glucose-Capillary: 110 mg/dL — ABNORMAL HIGH (ref 70–99)
Glucose-Capillary: 103 mg/dL — ABNORMAL HIGH (ref 70–99)

## 2024-04-16 LAB — COMPREHENSIVE METABOLIC PANEL WITH GFR
ALT: 19 U/L (ref 0–44)
AST: 21 U/L (ref 15–41)
Albumin: 3.8 g/dL (ref 3.5–5.0)
Alkaline Phosphatase: 42 U/L (ref 38–126)
Anion gap: 9 (ref 5–15)
BUN: 11 mg/dL (ref 8–23)
CO2: 21 mmol/L — ABNORMAL LOW (ref 22–32)
Calcium: 8.6 mg/dL — ABNORMAL LOW (ref 8.9–10.3)
Chloride: 109 mmol/L (ref 98–111)
Creatinine, Ser: 1.15 mg/dL (ref 0.61–1.24)
GFR, Estimated: >60 mL/min (ref >60)
Glucose, Bld: 91 mg/dL (ref 70–99)
Potassium: 3.9 mmol/L (ref 3.5–5.1)
Sodium: 139 mmol/L (ref 135–145)
Total Bilirubin: 1 mg/dL (ref 0.0–1.2)
Total Protein: 6.6 g/dL (ref 6.5–8.1)

## 2024-04-16 LAB — LIPID PANEL
Cholesterol: 128 mg/dL (ref 0–200)
HDL: 45 mg/dL (ref >40)
LDL Cholesterol: 62 mg/dL (ref 0–99)
Total CHOL/HDL Ratio: 2.8 ratio
Triglycerides: 105 mg/dL (ref <150)
VLDL: 21 mg/dL (ref 0–40)

## 2024-04-16 LAB — MAGNESIUM: Magnesium: 2 mg/dL (ref 1.7–2.4)

## 2024-04-16 LAB — ETHANOL: Alcohol, Ethyl (B): <15 mg/dL (ref <15)

## 2024-04-16 LAB — TSH: TSH: 3.635 u[IU]/mL (ref 0.350–4.500)

## 2024-04-16 MED ORDER — ASPIRIN 81 MG PO TBEC
81.0000 mg | DELAYED_RELEASE_TABLET | Freq: Every day | ORAL | Status: DC
  Administered 2024-04-16 – 2024-04-17 (×2): 81 mg via ORAL
  Filled 2024-04-16 (×2): qty 1

## 2024-04-16 MED ORDER — ENOXAPARIN SODIUM 40 MG/0.4ML IJ SOSY
40.0000 mg | PREFILLED_SYRINGE | INTRAMUSCULAR | Status: DC
  Administered 2024-04-16: 40 mg via SUBCUTANEOUS
  Filled 2024-04-16: qty 0.4

## 2024-04-16 NOTE — Care Management Obs Status (Signed)
 MEDICARE OBSERVATION STATUS NOTIFICATION   Patient Details  Name: Aaron Mendez MRN: [id number] Date of Birth: 1946/06/24   Medicare Observation Status Notification Given:  Yes    Tinita Brooker G., RN 04/16/2024, 8:52 AM

## 2024-04-16 NOTE — ED Notes (Signed)
 This RN called the floor to alert them that this pt was on the way up

## 2024-04-16 NOTE — Progress Notes (Signed)
  Echocardiogram 2D Echocardiogram has been performed.  Dean Wonder 04/16/2024, 9:56 AM

## 2024-04-16 NOTE — Progress Notes (Addendum)
 Patient arrived to room 3W09 via stretcher.  Patient is alert and oriented. Patient was able to transfer to the bed on his own.  VS taken, patient oriented to room, gown changed, skin assessment done, white board updated, call bell within reach and bed locked in lowest position.  Tele monitor applied and verified. Belongings inventoried (jeans, belt, pajama pants, hat, shoes, two shirts and keys).    Patient stated his cell phone was on the bedside table prior to coming to his room but it was not with his belongings.   Charge nurse, Mindy, notified and ED called to look for the phone.  Patient updated.  Patient remembered he put his phone in a pillow and found it.

## 2024-04-16 NOTE — Progress Notes (Addendum)
 STROKE TEAM PROGRESS NOTE     INTERIM HISTORY/SUBJECTIVE Patient presented with 1 week history of left leg weakness.  MRI shows pontine lacunar infarct likely from small vessel disease.  No family at the bedside.  Patient sitting up in bed in no apparent distress.  Feels his left side weakness is improved he is able to walk around with the walker   OBJECTIVE  CBC    Component Value Date/Time   WBC 5.5 04/16/2024 0303   RBC 4.75 04/16/2024 0303   HGB 13.6 04/16/2024 0303   HCT 42.9 04/16/2024 0303   PLT 198 04/16/2024 0303   MCV 90.3 04/16/2024 0303   MCH 28.6 04/16/2024 0303   MCHC 31.7 04/16/2024 0303   RDW 12.3 04/16/2024 0303   LYMPHSABS 1.4 04/16/2024 0303   MONOABS 0.8 04/16/2024 0303   EOSABS 0.2 04/16/2024 0303   BASOSABS 0.0 04/16/2024 0303    BMET    Component Value Date/Time   NA 139 04/16/2024 0303   K 3.9 04/16/2024 0303   CL 109 04/16/2024 0303   CO2 21 (L) 04/16/2024 0303   GLUCOSE 91 04/16/2024 0303   BUN 11 04/16/2024 0303   CREATININE 1.15 04/16/2024 0303   CREATININE 0.75 12/28/2019 1559   CALCIUM  8.6 (L) 04/16/2024 0303   GFRNONAA >60 04/16/2024 0303   GFRNONAA 90 12/28/2019 1559    IMAGING past 24 hours ECHOCARDIOGRAM COMPLETE Result Date: 04/16/2024    ECHOCARDIOGRAM REPORT   Patient Name:   Aaron Mendez Date of Exam: 04/16/2024 Medical Rec #:  [id number]    Height:       69.0 in Accession #:    [id number]   Weight:       185.2 lb Date of Birth:  08/28/1946    BSA:          2.000 m Patient Age:    78 years     BP:           120/72 mmHg Patient Gender: M            HR:           47 bpm. Exam Location:  Inpatient Procedure: 2D Echo (Both Spectral and Color Flow Doppler were utilized during            procedure). Indications:    TIA  History:        Patient has prior history of Echocardiogram examinations, most                 recent 10/29/2019. Arrythmias:Bradycardia; Risk Factors:Diabetes                 and Hypertension.  Sonographer:    Tinnie Barefoot  RDCS Referring Phys: [id number] JUSTIN B HOWERTER IMPRESSIONS  1. Left ventricular ejection fraction, by estimation, is 60 to 65%. The left ventricle has normal function. The left ventricle has no regional wall motion abnormalities. Left ventricular diastolic parameters were normal.  2. Right ventricular systolic function is normal. The right ventricular size is normal.  3. The mitral valve is normal in structure. No evidence of mitral valve regurgitation. No evidence of mitral stenosis.  4. The aortic valve is tricuspid. There is mild calcification of the aortic valve. There is mild thickening of the aortic valve. Aortic valve regurgitation is not visualized. No aortic stenosis is present.  5. The inferior vena cava is normal in size with greater than 50% respiratory variability, suggesting right atrial pressure of 3 mmHg. FINDINGS  Left Ventricle: Left ventricular  ejection fraction, by estimation, is 60 to 65%. The left ventricle has normal function. The left ventricle has no regional wall motion abnormalities. The left ventricular internal cavity size was normal in size. There is  no left ventricular hypertrophy. Left ventricular diastolic parameters were normal. Right Ventricle: The right ventricular size is normal. Right vetricular wall thickness was not well visualized. Right ventricular systolic function is normal. Left Atrium: Left atrial size was normal in size. Right Atrium: Right atrial size was normal in size. Pericardium: There is no evidence of pericardial effusion. Mitral Valve: The mitral valve is normal in structure. No evidence of mitral valve regurgitation. No evidence of mitral valve stenosis. Tricuspid Valve: The tricuspid valve is normal in structure. Tricuspid valve regurgitation is not demonstrated. No evidence of tricuspid stenosis. Aortic Valve: The aortic valve is tricuspid. There is mild calcification of the aortic valve. There is mild thickening of the aortic valve. There is mild aortic  valve annular calcification. Aortic valve regurgitation is not visualized. No aortic stenosis  is present. Aortic valve mean gradient measures 1.9 mmHg. Aortic valve peak gradient measures 4.8 mmHg. Aortic valve area, by VTI measures 3.35 cm. Pulmonic Valve: The pulmonic valve was not well visualized. Pulmonic valve regurgitation is not visualized. No evidence of pulmonic stenosis. Aorta: The aortic root is normal in size and structure. Venous: The inferior vena cava is normal in size with greater than 50% respiratory variability, suggesting right atrial pressure of 3 mmHg. IAS/Shunts: No atrial level shunt detected by color flow Doppler.  LEFT VENTRICLE PLAX 2D LVIDd:         5.10 cm     Diastology LVIDs:         3.60 cm     LV e' medial:    5.00 cm/s LV PW:         0.90 cm     LV E/e' medial:  15.7 LV IVS:        0.90 cm     LV e' lateral:   10.70 cm/s LVOT diam:     2.00 cm     LV E/e' lateral: 7.3 LV SV:         75 LV SV Index:   38 LVOT Area:     3.14 cm  LV Volumes (MOD) LV vol d, MOD A2C: 79.1 ml LV vol s, MOD A2C: 39.4 ml LV SV MOD A2C:     39.7 ml RIGHT VENTRICLE             IVC RV Basal diam:  2.80 cm     IVC diam: 0.90 cm RV S prime:     11.70 cm/s TAPSE (M-mode): 2.4 cm LEFT ATRIUM             Index        RIGHT ATRIUM           Index LA diam:        4.30 cm 2.15 cm/m   RA Area:     15.30 cm LA Vol (A2C):   43.5 ml 21.76 ml/m  RA Volume:   38.70 ml  19.35 ml/m LA Vol (A4C):   50.7 ml 25.36 ml/m LA Biplane Vol: 49.0 ml 24.51 ml/m  AORTIC VALVE AV Area (Vmax):    2.84 cm AV Area (Vmean):   3.06 cm AV Area (VTI):     3.35 cm AV Vmax:           109.95 cm/s AV Vmean:  62.539 cm/s AV VTI:            0.224 m AV Peak Grad:      4.8 mmHg AV Mean Grad:      1.9 mmHg LVOT Vmax:         99.40 cm/s LVOT Vmean:        60.900 cm/s LVOT VTI:          0.239 m LVOT/AV VTI ratio: 1.06  AORTA Ao Root diam: 3.30 cm MITRAL VALVE MV Area (PHT): 3.34 cm    SHUNTS MV Decel Time: 227 msec    Systemic VTI:   0.24 m MV E velocity: 78.50 cm/s  Systemic Diam: 2.00 cm MV A velocity: 77.10 cm/s MV E/A ratio:  1.02 Dorn Ross MD Electronically signed by Dorn Ross MD Signature Date/Time: 04/16/2024/12:18:34 PM    Final    CT ANGIO HEAD NECK W WO CM Result Date: 04/15/2024 CLINICAL DATA:  Follow-up examination for acute stroke. EXAM: CT ANGIOGRAPHY HEAD AND NECK WITH AND WITHOUT CONTRAST TECHNIQUE: Multidetector CT imaging of the head and neck was performed using the standard protocol during bolus administration of intravenous contrast. Multiplanar CT image reconstructions and MIPs were obtained to evaluate the vascular anatomy. Carotid stenosis measurements (when applicable) are obtained utilizing NASCET criteria, using the distal internal carotid diameter as the denominator. RADIATION DOSE REDUCTION: This exam was performed according to the departmental dose-optimization program which includes automated exposure control, adjustment of the mA and/or kV according to patient size and/or use of iterative reconstruction technique. CONTRAST:  75mL OMNIPAQUE  IOHEXOL  350 MG/ML SOLN COMPARISON:  MRI from earlier the same day. FINDINGS: CT HEAD FINDINGS Brain: Age-related cerebral atrophy with moderate chronic microvascular ischemic disease. Chronic left cerebellar infarct noted. Previously identified acute right pontine infarct noted, better seen on prior brain MRI. No acute intracranial hemorrhage. No acute large vessel territory infarct. No mass lesion or midline shift. No hydrocephalus or extra-axial fluid collection. Vascular: No abnormal hyperdense vessel. Calcified atherosclerosis present about the skull base. Skull: Scalp soft tissues within normal limits.  Calvarium intact. Sinuses/Orbits: Globes and orbital soft tissues within normal limits. Paranasal sinuses and mastoid air cells are largely clear. Other: None. Review of the MIP images confirms the above findings CTA NECK FINDINGS Aortic arch: Normal caliber with  standard branching. Mild aortic atherosclerosis. No stenosis. Right carotid system: Right common and internal carotid arteries are patent without dissection. Mild atheromatous change about the right carotid bulb without hemodynamically significant stenosis. Left carotid system: Left common and internal carotid arteries are patent without dissection. Mild atheromatous change about the left carotid bulb without hemodynamically significant stenosis. Vertebral arteries: Right vertebral artery dominant and widely patent without significant stenosis or dissection. 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 Left vertebral artery is largely occluded at its origin. Distal reconstitution with irregular attenuated flow within the left V2 and V3 segments. Additional moderate left V3 stenosis noted (series 11, image 159). No acute dissection. Skeleton: No worrisome osseous lesions. Moderate spondylosis at C3-4 through C6-7. Other neck: No other acute finding. Upper chest: No other acute finding. Review of the MIP images confirms the above findings CTA HEAD FINDINGS Anterior circulation: Moderate atheromatous change about the carotid siphons without hemodynamically significant stenosis. A1 segments patent bilaterally. Normal anterior communicating artery complex. Anterior cerebral arteries patent without significant stenosis. No M1 stenosis or occlusion. Distal MCA branches perfused and symmetric. Posterior circulation: Dominant right V4 segment widely patent. Right PICA patent at  its origin. Left vertebral artery patent as it courses into the cranial vault, but subsequently occludes at takeoff of the left PICA. Left PICA grossly patent at its origin. The basilar patent without stenosis. Superior cerebellar and posterior cerebral arteries patent bilaterally. Venous sinuses: Grossly patent allowing for timing the contrast bolus. Anatomic variants: As above.  No aneurysm. Review of  the MIP images confirms the above findings IMPRESSION: CT HEAD: 1. Subcentimeter acute right pontine infarct, better evaluated on prior brain MRI. No acute intracranial hemorrhage or significant mass effect. 2. Underlying age-related cerebral atrophy with moderate chronic microvascular ischemic disease, with chronic left cerebellar infarct. CTA HEAD AND NECK: 1. Negative CTA for acute large vessel occlusion or other emergent finding. 2. 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 Distal reconstitution with irregular attenuated flow within the left V2 and V3 segments. Left vertebral artery subsequently reoccludes at the takeoff of the left PICA. Overall, appearance is similar as compared to prior CTA from 2021. 3. 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 4. Moderate atheromatous change about the carotid siphons without hemodynamically significant stenosis. 5.  Aortic Atherosclerosis (ICD10-I70.0). Electronically Signed   By: Morene Hoard M.D.   On: 04/15/2024 23:02    Vitals:   04/16/24 0200 04/16/24 0804 04/16/24 1300 04/16/24 1520  BP: 127/72 120/72 124/70 121/63  Pulse: (!) 58 (!) 54 (!) 56 (!) 47  Resp: 18 18 18 18   Temp: 97.6 F (36.4 C) 97.6 F (36.4 C) 97.8 F (36.6 C) 97.6 F (36.4 C)  TempSrc:  Oral Oral Oral  SpO2: 99% 98% 100% 100%  Weight:      Height:         PHYSICAL EXAM General:  Alert, well-nourished, well-developed patient in no acute distress Psych:  Mood and affect appropriate for situation CV: Regular rate and rhythm on monitor Respiratory:  Regular, unlabored respirations on room air GI: Abdomen soft and nontender   NEURO:  Mental Status: AA&Ox3, patient is able to give clear and coherent history Speech/Language: speech is without dysarthria or aphasia.  Naming, repetition, fluency, and comprehension intact.  Cranial Nerves:  II: PERRL. Visual fields full.  III, IV, VI: EOMI. Eyelids elevate symmetrically.   V: Sensation is intact to light touch and symmetrical to face.  VII: Face is symmetrical resting and smiling VIII: hearing intact to voice. IX, X: Palate elevates symmetrically. Phonation is normal.  KP:Dynloizm shrug 5/5. XII: tongue is midline without fasciculations. Motor: 5/5 strength in right arm and leg, left leg with a drift left arm 5/5 but weak grip Tone: is normal and bulk is normal Sensation- Intact to light touch bilaterally. Extinction absent to light touch to DSS.   Coordination: Mild ataxia on left HKS: no ataxia in BLE.No drift.  Decreased fine motor skills on left hand and right over left orbiting Gait- deferred  Most Recent NIH  1a Level of Conscious.:  1b LOC Questions:  1c LOC Commands:  2 Best Gaze:  3 Visual:  4 Facial Palsy:  5a Motor Arm - left:  5b Motor Arm - Right:  6a Motor Leg - Left: 1 6b Motor Leg - Right:  7 Limb Ataxia: 1 8 Sensory:  9 Best Language:  10 Dysarthria:  11 Extinct. and Inatten.:  TOTAL: 2   ASSESSMENT/PLAN  Mr. Aaron Mendez is a 78 y.o. male with history of  htn, PAD, diabetes, sickle cell trait who presents with 1 week of left leg weakness.  He states  that it started relatively abruptly.  He has been getting around with a cane at home.  Due to this he sought care at urgent care today who referred him to the emergency department to get an MRI which reveals a sizable pontine stroke  NIH on Admission 3  Acute Ischemic Infarct:  right pons  Etiology: Small vessel disease CT head New focal hypoattenuation in the right pons, possibly representing an age indeterminate perforator infarct. Old infarct in the left cerebellar hemisphere. moderate chronic small vessel disease  CTA head & neck no LVO MRI  8 mm acute infarct within the right aspect of the pons.  2D Echo EF 60 to 65%. LDL 62 HgbA1c pending VTE prophylaxis -Lovenox  aspirin  81 mg daily prior to admission, now on aspirin  81 mg daily and clopidogrel  75 mg daily for 3 weeks  and then Plavix  alone. Therapy recommendations:  Pending Disposition: Pending  Hx of Stroke/TIA Old infarct in the left cerebellar hemisphere.  Hypertension Home meds: Valsartan 80 mg Stable Blood Pressure Goal: SBP less than 160   Hyperlipidemia Home meds: Rest or 10 mg, resumed in hospital LDL 62, goal < 70 Continue statin at discharge  Diabetes type II Controlled Home meds: None HgbA1c pending goal < 7.0 CBGs SSI Recommend close follow-up with PCP for better DM control   Dysphagia Patient has post-stroke dysphagia, SLP consulted    Diet   Diet Heart Room service appropriate? Yes; Fluid consistency: Thin   Advance diet as tolerated  Other Active Problems   Hospital day # 0   Karna Geralds DNP, ACNPC-AG  [facility]  I have personally obtained history,examined this patient, reviewed notes, independently viewed imaging studies, participated in medical decision making and plan of care.ROS completed by me personally and pertinent positives fully documented  I have made any additions or clarifications directly to the above note. Agree with note above.  Patient presented with 1 week history of difficulty walking with left leg weakness and incoordination due to pontine lacunar infarct from small vessel disease.  Continue aspirin  Plavix  for 3 weeks followed by Plavix  alone and aggressive risk factor modification.  Mobilize out of bed.  Therapy consults.  No family available at the bedside for discussion.   I personally spent a total of 50 minutes in the care of the patient today including getting/reviewing separately obtained history, performing a medically appropriate exam/evaluation, counseling and educating, placing orders, referring and communicating with other health care professionals, documenting clinical information in the EHR, independently interpreting results, and coordinating care.         Eather Popp, MD Medical Director [facility] Stroke  Center Pager: [phone] 04/16/2024 5:18 PM   To contact Stroke Continuity provider, please refer to [email or web address]. After hours, contact General Neurology

## 2024-04-16 NOTE — Evaluation (Signed)
 Physical Therapy Evaluation Patient Details Name: Aaron Mendez MRN: [id number] DOB: 1945/09/09 Today's Date: 04/16/2024  History of Present Illness  Michelangelo Rindfleisch is a 78 yo male who presented with LLE weakness that started 1 week ago. 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 PTA, pt was ModI with no AD or SP cane as needed. Pt presents with LLE weakness, impaired balance, and decreased activity tolerance. In today's session, pt required ModA for bed mobility and CGA to stand with RW. Able to ambulate 361ft with RW and CGA. Pt has impaired gait pattern with decreased foot clearance and knee flexion during swing phase of gait on LLE. Pt also able to ascend/descend 4 steps with 1UE support and MinA/ModA. Pt has 24/7 physical assist available upon returning home. Pt feels confident his friend can assist with stair negotiation and guarding during gait. Recommending post-acute HHPT with intended transition to OP PT. Pt would benefit from acute skilled PT with current functional limitations listed below (see PT Problem List). Acute PT to follow.         If plan is discharge home, recommend the following: A little help with walking and/or transfers;A little help with bathing/dressing/bathroom;Assist for transportation;Help with stairs or ramp for entrance;Assistance with cooking/housework   Can travel by private vehicle    Yes    Equipment Recommendations Rolling walker (2 wheels)     Functional Status Assessment Patient has had a recent decline in their functional status and demonstrates the ability to make significant improvements in function in a reasonable and predictable amount of time.     Precautions / Restrictions Precautions Precautions: Fall Restrictions Weight Bearing  Restrictions Per Provider Order: No      Mobility  Bed Mobility Overal bed mobility: Needs Assistance Bed Mobility: Supine to Sit    Supine to sit: Mod assist    General bed mobility comments: ModA to raise trunk    Transfers Overall transfer level: Needs assistance Equipment used: Rolling walker (2 wheels) Transfers: Sit to/from Stand Sit to Stand: Contact guard assist     Ambulation/Gait Ambulation/Gait assistance: Contact guard assist Gait Distance (Feet): 300 Feet Assistive device: Rolling walker (2 wheels) Gait Pattern/deviations: Step-to pattern, Decreased step length - left, Decreased stance time - left, Decreased dorsiflexion - left Gait velocity: decr    General Gait Details: limited ankle DF and knee flexion in swing phase with decreased step length on LLE. Cues for heel strike and proximity to RW  Stairs Stairs: Yes Stairs assistance: Min assist, Mod assist Stair Management: One rail Right, No rails, Step to pattern, Forwards Number of Stairs: 6 General stair comments: x2 steps with one handrail, x4 steps with 1UE support. MinA/ModA with 1UE support for steadying assist. Educated on technique on proper guarding with pt demonstrating good follow-through  Modified Rankin (Stroke Patients Only) Modified Rankin (Stroke Patients Only) Pre-Morbid Rankin Score: Moderate disability Modified Rankin: Moderately severe disability     Balance Overall balance assessment: Needs assistance, Mild deficits observed, not formally tested, History of Falls Sitting-balance support: No upper extremity supported, Feet supported Sitting balance-Leahy Scale: Good     Standing balance support: Bilateral upper extremity supported, During functional activity, Reliant on assistive device for balance Standing balance-Leahy Scale: Poor Standing balance comment: reliant on RW and UE support  Pertinent Vitals/Pain Pain Assessment Pain Assessment: No/denies pain    Home  Living Family/patient expects to be discharged to:: Private residence Living Arrangements: Non-relatives/Friends Available Help at Discharge: Friend(s);Available 24 hours/day Type of Home: House Home Access: Stairs to enter Entrance Stairs-Rails: None Entrance Stairs-Number of Steps: 2   Home Layout: One level Home Equipment: Shower seat;Grab bars - tub/shower;Crutches;Cane - single point      Prior Function Prior Level of Function : Independent/Modified Independent;Driving;History of Falls (last six months)    Mobility Comments: Ind with no AD or SP cane, x1 fall with pt stumbling ADLs Comments: Friend helps with cooking, cleaning, bathing as needed.     Extremity/Trunk Assessment   Upper Extremity Assessment Upper Extremity Assessment: Defer to OT evaluation    Lower Extremity Assessment Lower Extremity Assessment: RLE deficits/detail RLE Deficits / Details: 5/5 on MMT RLE Sensation: WNL LLE Deficits / Details: Grossly 4+/5, limited ankle DF and knee flexion during swing phase of gait LLE Sensation: WNL    Cervical / Trunk Assessment Cervical / Trunk Assessment: Normal  Communication   Communication Communication: No apparent difficulties    Cognition Arousal: Alert Behavior During Therapy: WFL for tasks assessed/performed   PT - Cognitive impairments: No apparent impairments    Following commands: Intact       Cueing Cueing Techniques: Verbal cues, Tactile cues      PT Assessment Patient needs continued PT services  PT Problem List Decreased strength;Decreased activity tolerance;Decreased balance;Decreased mobility       PT Treatment Interventions DME instruction;Gait training;Stair training;Functional mobility training;Therapeutic activities;Therapeutic exercise;Balance training;Neuromuscular re-education;Patient/family education    PT Goals (Current goals can be found in the Care Plan section)  Acute Rehab PT Goals Patient Stated Goal: to be able to  go home PT Goal Formulation: With patient Time For Goal Achievement: 04/30/24 Potential to Achieve Goals: Good    Frequency Min 2X/week        AM-PAC PT 6 Clicks Mobility  Outcome Measure Help needed turning from your back to your side while in a flat bed without using bedrails?: A Little Help needed moving from lying on your back to sitting on the side of a flat bed without using bedrails?: A Lot Help needed moving to and from a bed to a chair (including a wheelchair)?: A Little Help needed standing up from a chair using your arms (e.g., wheelchair or bedside chair)?: A Little Help needed to walk in hospital room?: A Little Help needed climbing 3-5 steps with a railing? : A Little 6 Click Score: 17    End of Session Equipment Utilized During Treatment: Gait belt Activity Tolerance: Patient tolerated treatment well Patient left: in chair;with call bell/phone within reach Nurse Communication: Mobility status PT Visit Diagnosis: Unsteadiness on feet (R26.81);Other abnormalities of gait and mobility (R26.89);Muscle weakness (generalized) (M62.81);History of falling (Z91.81)    Time: 9195-9171 PT Time Calculation (min) (ACUTE ONLY): 24 min   Charges:   PT Evaluation $PT Eval Low Complexity: 1 Low   PT General Charges $$ ACUTE PT VISIT: 1 Visit        Kate ORN, PT, DPT Secure Chat Preferred  Rehab Office [phone]   Kate BRAVO Wendolyn 04/16/2024, 8:55 AM

## 2024-04-16 NOTE — Progress Notes (Addendum)
 PROGRESS NOTE  Aaron Mendez FMW:[id number] DOB: Apr 25, 1946   PCP: Waylan Almarie SAUNDERS, MD  Patient is from: Home.  Uses cane and RW at baseline.  DOA: 04/15/2024 LOS: 0  Chief complaints Chief Complaint  Patient presents with   Weakness     Brief Narrative / Interim history: 78 year old M with PMH of DM-2, HTN, chronic microhemorrhage in the brainstem sinus bradycardia and osteoarthritis presented to ED with left-sided weakness for about a week, and found to have 8 mm acute right pontine infarct as noted on MRI brain.  Neurology consulted and admitted for further CVA workup.  He was loaded with 300 mg Plavix .  Patient reports taking full dose aspirin  daily.  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  LDL 62.  A1c and TTE pending.    Subjective: Seen and examined earlier this morning.  No major events overnight or this morning.  No complaints.  Objective: Vitals:   04/16/24 0045 04/16/24 0047 04/16/24 0200 04/16/24 0804  BP:  114/78 127/72 120/72  Pulse: 70  (!) 58 (!) 54  Resp: 13  18 18   Temp:   97.6 F (36.4 C) 97.6 F (36.4 C)  TempSrc:    Oral  SpO2: 100%  99% 98%  Weight:      Height:        Examination:  GENERAL: No apparent distress.  Nontoxic. HEENT: MMM.  Vision and hearing grossly intact.  NECK: Supple.  No apparent JVD.  RESP:  No IWOB.  Fair aeration bilaterally. CVS: HR in 50s.  Regular rhythm. Heart sounds normal.  ABD/GI/GU: BS+. Abd soft, NTND.  MSK/EXT:  Moves extremities. No apparent deformity. No edema.  SKIN: no apparent skin lesion or wound NEURO: Awake, alert and oriented appropriately. Speech clear. Cranial nerves II-XII intact. Motor 5/5 in all muscle groups of UE and LE bilaterally, Normal tone. Light sensation intact in all dermatomes of upper and lower ext bilaterally. Patellar reflex symmetric.  No pronator drift.  Finger to nose intact. PSYCH:  Calm. Normal affect.   Consultants:  Neurology  Procedures: None  Microbiology summarized: None  Assessment and plan: Acute right pontine ischemic CVA: Presents with left-sided weakness.  Outside window for TNK.  MRI showed 8 mm right pontine infarct.  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  LDL 62.  Patient was on full dose aspirin  at home.  Loaded with Plavix  in ED. -Aspirin  and Plavix  per neurology -Continue home Crestor  -Follow TTE and A1c -PT/OT/SLP eval  NIDDM-2: A1c 8.3% in 2021.  No interval value.  Does not seem to be on medications. Recent Labs  Lab 04/15/24 1454 04/15/24 2244 04/16/24 0744 04/16/24 1129  GLUCAP 83 121* 105* 141*  -Continue SSI-sensitive -Follow hemoglobin A1c  Chronic sinus bradycardia: HR in upper 40s and 50s and regular.  Not symptomatic.  TSH normal. - Continue monitoring  Essential hypertension: Normotensive.  On Diovan at home. - On Avapro .  Osteoarthritis/chronic pain - Tylenol  as needed  Body mass index is 27.35 kg/m.           DVT prophylaxis:  enoxaparin  (LOVENOX ) injection 40 mg Start: 04/16/24 1245 SCDs Start: 04/15/24 1933  Code Status: DNR Family Communication: Updated patient's daughter, son and grandson at bedside Level of care: Telemetry Medical Status is: Observation The patient will require care spanning > 2  midnights and should be moved to inpatient because: Acute CVA   Final disposition: To be determined   35 minutes with more than 50% spent in reviewing records, counseling patient/family and coordinating care.   Sch Meds:  Scheduled Meds:  aspirin  EC  81 mg Oral Daily   clopidogrel   75 mg Oral Daily   enoxaparin  (LOVENOX ) injection  40 mg Subcutaneous Q24H   insulin  aspart  0-5 Units Subcutaneous QHS   insulin  aspart  0-9 Units Subcutaneous TID WC   irbesartan   75 mg Oral Daily   latanoprost   1 drop Both  Eyes QHS   rosuvastatin   10 mg Oral QPM   Continuous Infusions: PRN Meds:.acetaminophen  **OR** acetaminophen , melatonin, ondansetron  (ZOFRAN ) IV  Antimicrobials: Anti-infectives (From admission, onward)    None        I have personally reviewed the following labs and images: CBC: Recent Labs  Lab 04/15/24 1215 04/16/24 0303  WBC 6.3 5.5  NEUTROABS 3.9 3.0  HGB 13.7 13.6  HCT 43.7 42.9  MCV 91.6 90.3  PLT 206 198   BMP &GFR Recent Labs  Lab 04/15/24 1215 04/16/24 0303  NA 141 139  K 4.0 3.9  CL 108 109  CO2 24 21*  GLUCOSE 108* 91  BUN 8 11  CREATININE 1.04 1.15  CALCIUM  8.6* 8.6*  MG  --  2.0   Estimated Creatinine Clearance: 52.1 mL/min (by C-G formula based on SCr of 1.15 mg/dL). Liver & Pancreas: Recent Labs  Lab 04/15/24 1215 04/16/24 0303  AST 20 21  ALT 19 19  ALKPHOS 42 42  BILITOT 1.3* 1.0  PROT 6.9 6.6  ALBUMIN 3.9 3.8   No results for input(s): LIPASE, AMYLASE in the last 168 hours. No results for input(s): AMMONIA in the last 168 hours. Diabetic: No results for input(s): HGBA1C in the last 72 hours. Recent Labs  Lab 04/15/24 1454 04/15/24 2244 04/16/24 0744 04/16/24 1129  GLUCAP 83 121* 105* 141*   Cardiac Enzymes: Recent Labs  Lab 04/15/24 1215  CKTOTAL 165   No results for input(s): PROBNP in the last 8760 hours. Coagulation Profile: No results for input(s): INR, PROTIME in the last 168 hours. Thyroid  Function Tests: Recent Labs    04/16/24 0303  TSH 3.635   Lipid Profile: Recent Labs    04/16/24 0303  CHOL 128  HDL 45  LDLCALC 62  TRIG 105  CHOLHDL 2.8   Anemia Panel: No results for input(s): VITAMINB12, FOLATE, FERRITIN, TIBC, IRON, RETICCTPCT in the last 72 hours. Urine analysis:    Component Value Date/Time   COLORURINE YELLOW 04/15/2024 1215   APPEARANCEUR CLEAR 04/15/2024 1215   LABSPEC 1.012 04/15/2024 1215   PHURINE 6.0 04/15/2024 1215   GLUCOSEU NEGATIVE 04/15/2024  1215   HGBUR NEGATIVE 04/15/2024 1215   BILIRUBINUR NEGATIVE 04/15/2024 1215   BILIRUBINUR negative 12/28/2019 1550   KETONESUR NEGATIVE 04/15/2024 1215   PROTEINUR NEGATIVE 04/15/2024 1215   UROBILINOGEN 1.0 12/28/2019 1550   NITRITE NEGATIVE 04/15/2024 1215   LEUKOCYTESUR NEGATIVE 04/15/2024 1215   Sepsis Labs: Invalid input(s): PROCALCITONIN, LACTICIDVEN  Microbiology: No results found for this or any previous visit (from the past 240 hours).  Radiology Studies: CT ANGIO HEAD NECK W WO CM Result Date: 04/15/2024 CLINICAL DATA:  Follow-up examination for acute stroke. EXAM: CT ANGIOGRAPHY HEAD AND NECK WITH AND WITHOUT CONTRAST TECHNIQUE: Multidetector CT imaging of the head and neck was performed using the standard protocol during bolus administration of intravenous contrast. Multiplanar CT image reconstructions and  MIPs were obtained to evaluate the vascular anatomy. Carotid stenosis measurements (when applicable) are obtained utilizing NASCET criteria, using the distal internal carotid diameter as the denominator. RADIATION DOSE REDUCTION: This exam was performed according to the departmental dose-optimization program which includes automated exposure control, adjustment of the mA and/or kV according to patient size and/or use of iterative reconstruction technique. CONTRAST:  75mL OMNIPAQUE  IOHEXOL  350 MG/ML SOLN COMPARISON:  MRI from earlier the same day. FINDINGS: CT HEAD FINDINGS Brain: Age-related cerebral atrophy with moderate chronic microvascular ischemic disease. Chronic left cerebellar infarct noted. Previously identified acute right pontine infarct noted, better seen on prior brain MRI. No acute intracranial hemorrhage. No acute large vessel territory infarct. No mass lesion or midline shift. No hydrocephalus or extra-axial fluid collection. Vascular: No abnormal hyperdense vessel. Calcified atherosclerosis present about the skull base. Skull: Scalp soft tissues within normal  limits.  Calvarium intact. Sinuses/Orbits: Globes and orbital soft tissues within normal limits. Paranasal sinuses and mastoid air cells are largely clear. Other: None. Review of the MIP images confirms the above findings CTA NECK FINDINGS Aortic arch: Normal caliber with standard branching. Mild aortic atherosclerosis. No stenosis. Right carotid system: Right common and internal carotid arteries are patent without dissection. Mild atheromatous change about the right carotid bulb without hemodynamically significant stenosis. Left carotid system: Left common and internal carotid arteries are patent without dissection. Mild atheromatous change about the left carotid bulb without hemodynamically significant stenosis. Vertebral arteries: Right vertebral artery dominant and widely patent without significant stenosis or dissection. 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 Left vertebral artery is largely occluded at its origin. Distal reconstitution with irregular attenuated flow within the left V2 and V3 segments. Additional moderate left V3 stenosis noted (series 11, image 159). No acute dissection. Skeleton: No worrisome osseous lesions. Moderate spondylosis at C3-4 through C6-7. Other neck: No other acute finding. Upper chest: No other acute finding. Review of the MIP images confirms the above findings CTA HEAD FINDINGS Anterior circulation: Moderate atheromatous change about the carotid siphons without hemodynamically significant stenosis. A1 segments patent bilaterally. Normal anterior communicating artery complex. Anterior cerebral arteries patent without significant stenosis. No M1 stenosis or occlusion. Distal MCA branches perfused and symmetric. Posterior circulation: Dominant right V4 segment widely patent. Right PICA patent at its origin. Left vertebral artery patent as it courses into the cranial vault, but subsequently occludes at takeoff of the left PICA.  Left PICA grossly patent at its origin. The basilar patent without stenosis. Superior cerebellar and posterior cerebral arteries patent bilaterally. Venous sinuses: Grossly patent allowing for timing the contrast bolus. Anatomic variants: As above.  No aneurysm. Review of the MIP images confirms the above findings IMPRESSION: CT HEAD: 1. Subcentimeter acute right pontine infarct, better evaluated on prior brain MRI. No acute intracranial hemorrhage or significant mass effect. 2. Underlying age-related cerebral atrophy with moderate chronic microvascular ischemic disease, with chronic left cerebellar infarct. CTA HEAD AND NECK: 1. Negative CTA for acute large vessel occlusion or other emergent finding. 2. 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 Distal reconstitution with irregular attenuated flow within the left V2 and V3 segments. Left vertebral artery subsequently reoccludes at the takeoff of the left PICA. Overall, appearance is similar as compared to prior CTA from 2021. 3. 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 4. Moderate atheromatous change about the carotid siphons without hemodynamically significant stenosis. 5.  Aortic Atherosclerosis (ICD10-I70.0). Electronically Signed   By:  Morene Hoard M.D.   On: 04/15/2024 23:02   MR BRAIN WO CONTRAST Result Date: 04/15/2024 CLINICAL DATA:  Provided history: Neuro deficit, acute, stroke suspected. EXAM: MRI HEAD WITHOUT CONTRAST TECHNIQUE: Multiplanar, multiecho pulse sequences of the brain and surrounding structures were obtained without intravenous contrast. COMPARISON:  Head CT 04/15/2024. Brain MRI 10/29/2019. CTA head/neck 04/15/2024. FINDINGS: Brain: No age-advanced or lobar predominant cerebral atrophy. 8 mm acute infarct within the right aspect of the pons. Unchanged small chronic lacunar infarct within the left aspect of the pons. Background mild pontine chronic small vessel ischemic disease. Few  punctate chronic microhemorrhages within the brainstem. Unchanged moderate-sized chronic infarct within the left cerebellar hemisphere. Unchanged chronic lacunar infarct within the right thalamus. Multifocal T2 FLAIR hyperintense signal abnormality within the cerebral white matter, nonspecific but compatible with moderate chronic small vessel ischemic disease. No evidence of an intracranial mass. No extra-axial fluid collection. No midline shift. Vascular: Known occlusion of the left vertebral artery. Flow voids preserved elsewhere within the proximal large arterial vessels. Skull and upper cervical spine: No focal worrisome marrow lesion. Sinuses/Orbits: No mass or acute finding within the imaged orbits. Prior left ocular lens replacement. Minimal mucosal thickening within the bilateral maxillary sinuses. IMPRESSION: 1. 8 mm acute infarct within the right aspect of the pons. 2. Background chronic small vessel ischemic disease and chronic infarcts, as described. 3. 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 4. Known occlusion of the left vertebral artery. Electronically Signed   By: Rockey Childs D.O.   On: 04/15/2024 16:47   CT Head Wo Contrast Result Date: 04/15/2024 EXAM: CT HEAD WITHOUT CONTRAST 04/15/2024 02:53:44 PM TECHNIQUE: CT of the head was performed without the administration of intravenous contrast. Automated exposure control, iterative reconstruction, and/or weight based adjustment of the mA/kV was utilized to reduce the radiation dose to as low as reasonably achievable. COMPARISON: CT head, CTA head and neck, MRI brain 10/29/2019 CLINICAL HISTORY: Neuro deficit, acute, stroke suspected. Chief complaints; Weakness. FINDINGS: BRAIN AND VENTRICLES: Focal hypoattenuation in the right pons seen on axial image 11 series 2 is new from the prior studies and may reflect an age indeterminate perforator infarct. Old infarct in the left  cerebellar hemisphere. Stable background of moderate chronic small vessel disease in the cerebral white matter. Cortical gray white differentiation is otherwise preserved. No acute hemorrhage. No hydrocephalus. No extra-axial collection. No mass effect or midline shift. ORBITS: No acute abnormality. SINUSES: No acute abnormality. SOFT TISSUES AND SKULL: No acute soft tissue abnormality. No skull fracture. Severe degenerative changes of the right greater than left atlanto-occipital joints. IMPRESSION: 1. New focal hypoattenuation in the right pons, possibly representing an age indeterminate perforator infarct. Correlate with symptoms and consider MRI of the brain without contrast for further characterization. 2. Old infarct in the left cerebellar hemisphere. 3. Stable background of moderate chronic small vessel disease in the cerebral white matter. Electronically signed by: Ryan Chess MD 04/15/2024 03:16 PM EDT RP Workstation: [id number]      Brinsley Wence T. Kaedyn Polivka [facility]  If 7PM-7AM, please contact night-coverage [email or web address] 04/16/2024, 11:52 AM

## 2024-04-16 NOTE — Evaluation (Signed)
 Occupational Therapy Evaluation Patient Details Name: Aaron Mendez MRN: [id number] DOB: Jul 24, 1946 Today's Date: 04/16/2024   History of Present Illness   Aaron Mendez is a 78 yo male who presented with LLE weakness that started 1 week ago. 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 He is typically indep at baseline, however he has needed increased assist for ADLs and mobility recently due to LLE weakness. Upon evaluation the pt was limited by LLE weakness, unsteady gait and decreased activity tolerance. Overall he needed generalized superivsion A for mobility with RW. Due to the deficits listed below the pt also needs up to C[facility] for ADLs. Pt will benefit from continued acute OT services and HHOT      If plan is discharge home, recommend the following:   A little help with walking and/or transfers;A little help with bathing/dressing/bathroom;Assistance with cooking/housework;Assist for transportation;Help with stairs or ramp for entrance     Functional Status Assessment   Patient has had a recent decline in their functional status and demonstrates the ability to make significant improvements in function in a reasonable and predictable amount of time.     Equipment Recommendations   Other (comment) (SPC (his went missing in the ED), RW)      Precautions/Restrictions   Precautions Precautions: Fall Restrictions Weight Bearing Restrictions Per Provider Order: No     Mobility Bed Mobility               General bed mobility comments: OOB on arrival    Transfers Overall transfer level: Needs assistance Equipment used: Rolling walker (2 wheels) Transfers: Sit to/from Stand Sit to Stand: Supervision                  Balance Overall balance  assessment: Needs assistance Sitting-balance support: Feet supported Sitting balance-Leahy Scale: Normal     Standing balance support: Single extremity supported, During functional activity Standing balance-Leahy Scale: Fair Standing balance comment: statically at the sink and for toileting         ADL either performed or assessed with clinical judgement   ADL Overall ADL's : Needs assistance/impaired Eating/Feeding: Independent   Grooming: Supervision/safety;Standing   Upper Body Bathing: Set up;Sitting   Lower Body Bathing: Contact guard assist;Sit to/from stand   Upper Body Dressing : Set up;Sitting   Lower Body Dressing: Contact guard assist;Sit to/from stand   Toilet Transfer: Supervision/safety;Ambulation;Regular Toilet;Rolling walker (2 wheels)   Toileting- Clothing Manipulation and Hygiene: Supervision/safety       Functional mobility during ADLs: Supervision/safety;Rolling walker (2 wheels) General ADL Comments: LLE weakness noted, good foot clearance and awareness of deficits. assist given for safety     Vision Baseline Vision/History: 1 Wears glasses Vision Assessment?: No apparent visual deficits     Perception Perception: Within Functional Limits       Praxis Praxis: WFL       Pertinent Vitals/Pain Pain Assessment Pain Assessment: No/denies pain     Extremity/Trunk Assessment Upper Extremity Assessment Upper Extremity Assessment: Defer to OT evaluation   Lower Extremity Assessment Lower Extremity Assessment: RLE deficits/detail RLE Deficits / Details: 5/5 on MMT RLE Sensation: WNL LLE Deficits / Details: Grossly 4+/5, limited ankle DF and knee flexion during swing phase of gait LLE Sensation: WNL   Cervical / Trunk Assessment Cervical / Trunk Assessment: Normal  Communication Communication Communication: No apparent difficulties   Cognition Arousal: Alert Behavior During Therapy: WFL for tasks assessed/performed                Following commands: Intact       Cueing  General Comments   Cueing Techniques: Verbal cues;Tactile cues  VSS - pt stated that his blue/black SPC went missing. Last seen was on the stretcher when he came up to 3w from the stretcher.           Home Living Family/patient expects to be discharged to:: Private residence Living Arrangements: Non-relatives/Friends Available Help at Discharge: Friend(s);Available 24 hours/day Type of Home: House Home Access: Stairs to enter [employer] of Steps: 2 Entrance Stairs-Rails: None Home Layout: One level     Bathroom Shower/Tub: Chief Strategy Officer: Standard     Home Equipment: Shower seat;Grab bars - tub/shower;Crutches;Cane - single point          Prior Functioning/Environment Prior Level of Function : Independent/Modified Independent;Driving;History of Falls (last six months)             Mobility Comments: Ind with no AD or SP cane, x1 fall with pt stumbling ADLs Comments: Friend helps with cooking, cleaning, bathing as needed.    OT Problem List: Decreased strength;Decreased activity tolerance;Decreased range of motion;Impaired balance (sitting and/or standing);Decreased safety awareness   OT Treatment/Interventions: Therapeutic exercise;Self-care/ADL training;Neuromuscular education;DME and/or AE instruction;Therapeutic activities;Patient/family education;Balance training      OT Goals(Current goals can be found in the care plan section)   Acute Rehab OT Goals Patient Stated Goal: home OT Goal Formulation: With patient Time For Goal Achievement: 04/30/24 Potential to Achieve Goals: Good ADL Goals Additional ADL Goal #1: Pt will complete ADLs with mod I and LRAD Additional ADL Goal #2: Pt will independently navigate busy hospital environment to demonstrate reduced fall risk   OT Frequency:  Min 2X/week       AM-PAC OT 6 Clicks Daily Activity     Outcome Measure Help from  another person eating meals?: None Help from another person taking care of personal grooming?: A Little Help from another person toileting, which includes using toliet, bedpan, or urinal?: A Little Help from another person bathing (including washing, rinsing, drying)?: A Little Help from another person to put on and taking off regular upper body clothing?: A Little Help from another person to put on and taking off regular lower body clothing?: A Little 6 Click Score: 19   End of Session Equipment Utilized During Treatment: Rolling walker (2 wheels) Nurse Communication: Mobility status (personal SPC is missing)  Activity Tolerance: Patient tolerated treatment well Patient left: in chair;with call bell/phone within reach  OT Visit Diagnosis: Unsteadiness on feet (R26.81);Other abnormalities of gait and mobility (R26.89);Muscle weakness (generalized) (M62.81);Hemiplegia and hemiparesis Hemiplegia - Right/Left: Left Hemiplegia - dominant/non-dominant: Non-Dominant                Time: 9169-9149 OT Time Calculation (min): 20 min Charges:  OT General Charges $OT Visit: 1 Visit OT Evaluation $OT Eval Moderate Complexity: 1 Mod  Aaron Mendez, OTR/L Acute Rehabilitation Services Office [phone] Secure Chat Communication Preferred   Aaron Mendez 04/16/2024, 10:29 AM

## 2024-04-17 DIAGNOSIS — R001 Bradycardia, unspecified: Secondary | ICD-10-CM | POA: Diagnosis not present

## 2024-04-17 DIAGNOSIS — Z8679 Personal history of other diseases of the circulatory system: Secondary | ICD-10-CM | POA: Diagnosis not present

## 2024-04-17 DIAGNOSIS — Z7982 Long term (current) use of aspirin: Secondary | ICD-10-CM | POA: Diagnosis not present

## 2024-04-17 DIAGNOSIS — M1990 Unspecified osteoarthritis, unspecified site: Secondary | ICD-10-CM | POA: Diagnosis not present

## 2024-04-17 DIAGNOSIS — I639 Cerebral infarction, unspecified: Secondary | ICD-10-CM | POA: Diagnosis not present

## 2024-04-17 DIAGNOSIS — R2681 Unsteadiness on feet: Secondary | ICD-10-CM | POA: Diagnosis not present

## 2024-04-17 DIAGNOSIS — Z6827 Body mass index (BMI) 27.0-27.9, adult: Secondary | ICD-10-CM | POA: Diagnosis not present

## 2024-04-17 DIAGNOSIS — I10 Essential (primary) hypertension: Secondary | ICD-10-CM | POA: Diagnosis not present

## 2024-04-17 DIAGNOSIS — G894 Chronic pain syndrome: Secondary | ICD-10-CM | POA: Diagnosis not present

## 2024-04-17 DIAGNOSIS — R29898 Other symptoms and signs involving the musculoskeletal system: Secondary | ICD-10-CM | POA: Diagnosis not present

## 2024-04-17 DIAGNOSIS — E119 Type 2 diabetes mellitus without complications: Secondary | ICD-10-CM | POA: Diagnosis not present

## 2024-04-17 LAB — GLUCOSE, CAPILLARY
Glucose-Capillary: 117 mg/dL — ABNORMAL HIGH (ref 70–99)
Glucose-Capillary: 114 mg/dL — ABNORMAL HIGH (ref 70–99)

## 2024-04-17 MED ORDER — ASPIRIN 81 MG PO TBEC
81.0000 mg | DELAYED_RELEASE_TABLET | Freq: Every day | ORAL | Status: AC
Start: 2024-04-17 — End: 2024-05-08

## 2024-04-17 MED ORDER — CLOPIDOGREL BISULFATE 75 MG PO TABS: 75.0000 mg | ORAL_TABLET | Freq: Every day | ORAL | 3 refills | Status: AC

## 2024-04-17 NOTE — Discharge Summary (Signed)
 Physician Discharge Summary  Aaron Mendez FMW:[id number] DOB: 09/27/1945 DOA: 04/15/2024  PCP: Waylan Almarie SAUNDERS, MD  Admit date: 04/15/2024 Discharge date: 04/17/24  Admitted From: Home Disposition: Home Recommendations for Outpatient Follow-up:  Outpatient follow-up with PCP in 1 to 2 weeks Follow hemoglobin A1c. Check CMP and CBC at follow-up Please follow up on the following pending results: Hemoglobin A1c  Home Health: [facility] PT/OT Equipment/Devices: Rolling walker  Discharge Condition: Stable CODE STATUS: DNR Diet Orders (From admission, onward)     Start     Ordered   04/17/24 0000  Diet - low sodium heart healthy        04/17/24 0759   04/16/24 0615  Diet Heart Room service appropriate? Yes; Fluid consistency: Thin  Diet effective now       Question Answer Comment  Room service appropriate? Yes   Fluid consistency: Thin      04/16/24 9385             Follow-up Information     Waylan Almarie SAUNDERS, MD. Schedule an appointment as soon as possible for a visit in 1 week(s).   Specialty: Family Medicine Contact information: [address] [place] [address] [phone]         Care, [facility] Follow up.   Specialty: Home Health Services Contact information: [address] [place] [address] [phone]         [facility], [facility] Follow up.   Contact information: [address] [address] [phone]         Inc, [facility] .   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  Neurology consulted and admitted for further CVA workup.  He was loaded with 300 mg  Plavix .  Patient reports taking full dose aspirin  daily.  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  LDL 62.  TTE without significant finding.    On the day of discharge, patient's symptoms improved.  Neurology recommended aspirin  and Plavix  A1c for 3 weeks followed by Plavix  alone since patient was on aspirin  prior to this hospitalization.   HH PT/OT and rolling walker ordered as recommended by therapy.  See individual problem list below for more.   Problems addressed during this hospitalization Acute right pontine ischemic CVA: Presents with left-sided weakness.  Outside window for TNK.  MRI showed 8 mm right pontine infarct.  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  TTE without significant finding.  LDL 62.  Patient was on full dose aspirin  at home.  Loaded with Plavix  in ED. -Aspirin  and Plavix  for 3 weeks followed by Plavix  alone per neurology -Continue home Crestor  -Follow hemoglobin A1c -HH PT/OT and rolling walker ordered.   NIDDM-2: A1c 8.3% in 2021.  No interval value.  - Follow hemoglobin A1c   Chronic sinus bradycardia: HR in upper 50s.  Not symptomatic.  TSH normal. - Avoid nodal blocking agents   Essential hypertension: Normotensive.  On Diovan at home. - Continue home Diovan.     Osteoarthritis/chronic pain - Tylenol  as needed    Body mass index is 27.35 kg/m.           Consultations: Neurology  Time spent 35  minutes  Vital signs Vitals:   04/16/24 1520 04/16/24 2029 04/16/24 2339 04/17/24 0809  BP: 121/63 119/78 109/71 123/72  Pulse: (!) 47 (!) 50 (!) 52 (!) 55  Temp: 97.6 F (36.4 C) 98 F (36.7 C) 97.9 F (36.6 C) 97.8 F (36.6 C)  Resp: 18 18 17 18   Height:      Weight:      SpO2: 100% 98% 96% 95%  TempSrc: Oral Oral Oral Oral  BMI (Calculated):          Discharge exam GENERAL: No apparent distress.  Nontoxic. HEENT: MMM.  Vision and hearing grossly intact.  NECK: Supple.  No apparent JVD.  RESP:  No IWOB.  Fair aeration bilaterally. CVS: HR in 50s.  Regular rhythm. Heart sounds normal.  ABD/GI/GU: BS+. Abd soft, NTND.  MSK/EXT:  Moves extremities. No apparent deformity. No edema.  SKIN: no apparent skin lesion or wound NEURO: Awake, alert and oriented appropriately. Speech clear. Cranial nerves II-XII intact. Motor 5/5 in all muscle groups of UE and LE bilaterally.  Light sensation intact in all dermatomes of upper and lower ext bilaterally. Patellar reflex symmetric.  No pronator drift.  Finger to nose intact. PSYCH: Calm. Normal affect.    Discharge Instructions Discharge Instructions     Diet - low sodium heart healthy   Complete by: As directed    Discharge instructions   Complete by: As directed    It has been a pleasure taking care of you!  You were hospitalized due to stroke for which you have been started on medication.  This very important that you are take your medications as prescribed.  Please review your new medication list and the directions on your medications before you take them.  Follow-up with your primary care doctor in 1 to 2 weeks or sooner if needed.  Follow-up with neurology per their recommendation.   Take care,   Increase activity slowly   Complete by: As directed       Allergies as of 04/17/2024   No Known Allergies      Medication List     STOP taking these medications    ciprofloxacin 500 MG tablet Commonly known as: CIPRO       TAKE these medications    Accu-Chek Aviva Plus test strip Generic drug: glucose blood Use to check glucose up to two times per day.  Dx: E11.9   Accu-Chek Aviva Plus w/Device Kit Use to check glucose up to two times per day.  Dx: E11.9   Accu-Chek Softclix Lancets lancets Use to check glucose up to two times per day.  Dx: E11.9   aspirin  EC 81 MG  tablet Take 1 tablet (81 mg total) by mouth daily for 21 days. Swallow whole. What changed:  medication strength See the new instructions.   clopidogrel  75 MG tablet Commonly known as: PLAVIX  Take 1 tablet (75 mg total) by mouth daily.   latanoprost  0.005 % ophthalmic solution Commonly known as: XALATAN  Place 1 drop into both eyes every evening.   meloxicam  7.5 MG tablet Commonly known as: MOBIC  TAKE 2 TABLETS BY MOUTH DAILY AS NEEDED FOR PAIN. TAKE ONCE YOU'VE COMPLETED STEROID PACK   multivitamin with minerals Tabs tablet Take 1 tablet  by mouth daily.   mupirocin  cream 2 % Commonly known as: BACTROBAN  Apply 1 Application topically 2 (two) times daily. What changed:  when to take this reasons to take this   rosuvastatin  10 MG tablet Commonly known as: CRESTOR  TAKE 1 TABLET(10 MG) BY MOUTH DAILY What changed:  how much to take how to take this when to take this additional instructions   sildenafil 100 MG tablet Commonly known as: VIAGRA Take 100 mg by mouth daily as needed for erectile dysfunction.   traMADol  50 MG tablet Commonly known as: ULTRAM  Take 1 tablet (50 mg total) by mouth 3 (three) times daily as needed. What changed: reasons to take this   triamcinolone  cream 0.1 % Commonly known as: KENALOG  Apply 1 Application topically 2 (two) times daily as needed (itching). Apply to arms, trunk. May apply sparingly to the neck. Do not apply to face   valsartan 80 MG tablet Commonly known as: DIOVAN Take 80 mg by mouth daily.   Vitamin D (Ergocalciferol) 1.25 MG (50000 UNIT) Caps capsule Commonly known as: DRISDOL Take 50,000 Units by mouth once a week.               Durable Medical Equipment  (From admission, onward)           Start     Ordered   04/17/24 0756  For home use only DME Walker rolling  Once       Question Answer Comment  Walker: With 5 Inch Wheels   Patient needs a walker to treat with the following condition Stroke (HCC)       04/17/24 0755             Procedures/Studies:   ECHOCARDIOGRAM COMPLETE Result Date: 04/16/2024    ECHOCARDIOGRAM REPORT   Patient Name:   Elman Lema Date of Exam: 04/16/2024 Medical Rec #:  [id number]    Height:       69.0 in Accession #:    [id number]   Weight:       185.2 lb Date of Birth:  05-Apr-1946    BSA:          2.000 m Patient Age:    78 years     BP:           120/72 mmHg Patient Gender: M            HR:           47 bpm. Exam Location:  Inpatient Procedure: 2D Echo (Both Spectral and Color Flow Doppler were utilized during            procedure). Indications:    TIA  History:        Patient has prior history of Echocardiogram examinations, most                 recent 10/29/2019. Arrythmias:Bradycardia; Risk Factors:Diabetes                 and Hypertension.  Sonographer:    Tinnie Barefoot RDCS Referring Phys: [id number] JUSTIN B HOWERTER IMPRESSIONS  1. Left ventricular ejection fraction, by estimation, is 60 to 65%. The left ventricle has normal function. The left ventricle has no regional wall motion abnormalities. Left ventricular diastolic parameters were normal.  2. Right ventricular systolic function is normal. The right ventricular size is normal.  3. The mitral valve is normal in structure. No evidence of mitral valve regurgitation. No evidence of mitral stenosis.  4. The aortic valve is tricuspid.  There is mild calcification of the aortic valve. There is mild thickening of the aortic valve. Aortic valve regurgitation is not visualized. No aortic stenosis is present.  5. The inferior vena cava is normal in size with greater than 50% respiratory variability, suggesting right atrial pressure of 3 mmHg. FINDINGS  Left Ventricle: Left ventricular ejection fraction, by estimation, is 60 to 65%. The left ventricle has normal function. The left ventricle has no regional wall motion abnormalities. The left ventricular internal cavity size was normal in size. There is  no left ventricular  hypertrophy. Left ventricular diastolic parameters were normal. Right Ventricle: The right ventricular size is normal. Right vetricular wall thickness was not well visualized. Right ventricular systolic function is normal. Left Atrium: Left atrial size was normal in size. Right Atrium: Right atrial size was normal in size. Pericardium: There is no evidence of pericardial effusion. Mitral Valve: The mitral valve is normal in structure. No evidence of mitral valve regurgitation. No evidence of mitral valve stenosis. Tricuspid Valve: The tricuspid valve is normal in structure. Tricuspid valve regurgitation is not demonstrated. No evidence of tricuspid stenosis. Aortic Valve: The aortic valve is tricuspid. There is mild calcification of the aortic valve. There is mild thickening of the aortic valve. There is mild aortic valve annular calcification. Aortic valve regurgitation is not visualized. No aortic stenosis  is present. Aortic valve mean gradient measures 1.9 mmHg. Aortic valve peak gradient measures 4.8 mmHg. Aortic valve area, by VTI measures 3.35 cm. Pulmonic Valve: The pulmonic valve was not well visualized. Pulmonic valve regurgitation is not visualized. No evidence of pulmonic stenosis. Aorta: The aortic root is normal in size and structure. Venous: The inferior vena cava is normal in size with greater than 50% respiratory variability, suggesting right atrial pressure of 3 mmHg. IAS/Shunts: No atrial level shunt detected by color flow Doppler.  LEFT VENTRICLE PLAX 2D LVIDd:         5.10 cm     Diastology LVIDs:         3.60 cm     LV e' medial:    5.00 cm/s LV PW:         0.90 cm     LV E/e' medial:  15.7 LV IVS:        0.90 cm     LV e' lateral:   10.70 cm/s LVOT diam:     2.00 cm     LV E/e' lateral: 7.3 LV SV:         75 LV SV Index:   38 LVOT Area:     3.14 cm  LV Volumes (MOD) LV vol d, MOD A2C: 79.1 ml LV vol s, MOD A2C: 39.4 ml LV SV MOD A2C:     39.7 ml RIGHT VENTRICLE             IVC RV Basal diam:   2.80 cm     IVC diam: 0.90 cm RV S prime:     11.70 cm/s TAPSE (M-mode): 2.4 cm LEFT ATRIUM             Index        RIGHT ATRIUM           Index LA diam:        4.30 cm 2.15 cm/m   RA Area:     15.30 cm LA Vol (A2C):   43.5 ml 21.76 ml/m  RA Volume:   38.70 ml  19.35 ml/m LA Vol (A4C):   50.7  ml 25.36 ml/m LA Biplane Vol: 49.0 ml 24.51 ml/m  AORTIC VALVE AV Area (Vmax):    2.84 cm AV Area (Vmean):   3.06 cm AV Area (VTI):     3.35 cm AV Vmax:           109.95 cm/s AV Vmean:          62.539 cm/s AV VTI:            0.224 m AV Peak Grad:      4.8 mmHg AV Mean Grad:      1.9 mmHg LVOT Vmax:         99.40 cm/s LVOT Vmean:        60.900 cm/s LVOT VTI:          0.239 m LVOT/AV VTI ratio: 1.06  AORTA Ao Root diam: 3.30 cm MITRAL VALVE MV Area (PHT): 3.34 cm    SHUNTS MV Decel Time: 227 msec    Systemic VTI:  0.24 m MV E velocity: 78.50 cm/s  Systemic Diam: 2.00 cm MV A velocity: 77.10 cm/s MV E/A ratio:  1.02 Dorn Ross MD Electronically signed by Dorn Ross MD Signature Date/Time: 04/16/2024/12:18:34 PM    Final    CT ANGIO HEAD NECK W WO CM Result Date: 04/15/2024 CLINICAL DATA:  Follow-up examination for acute stroke. EXAM: CT ANGIOGRAPHY HEAD AND NECK WITH AND WITHOUT CONTRAST TECHNIQUE: Multidetector CT imaging of the head and neck was performed using the standard protocol during bolus administration of intravenous contrast. Multiplanar CT image reconstructions and MIPs were obtained to evaluate the vascular anatomy. Carotid stenosis measurements (when applicable) are obtained utilizing NASCET criteria, using the distal internal carotid diameter as the denominator. RADIATION DOSE REDUCTION: This exam was performed according to the departmental dose-optimization program which includes automated exposure control, adjustment of the mA and/or kV according to patient size and/or use of iterative reconstruction technique. CONTRAST:  75mL OMNIPAQUE  IOHEXOL  350 MG/ML SOLN COMPARISON:  MRI from earlier the  same day. FINDINGS: CT HEAD FINDINGS Brain: Age-related cerebral atrophy with moderate chronic microvascular ischemic disease. Chronic left cerebellar infarct noted. Previously identified acute right pontine infarct noted, better seen on prior brain MRI. No acute intracranial hemorrhage. No acute large vessel territory infarct. No mass lesion or midline shift. No hydrocephalus or extra-axial fluid collection. Vascular: No abnormal hyperdense vessel. Calcified atherosclerosis present about the skull base. Skull: Scalp soft tissues within normal limits.  Calvarium intact. Sinuses/Orbits: Globes and orbital soft tissues within normal limits. Paranasal sinuses and mastoid air cells are largely clear. Other: None. Review of the MIP images confirms the above findings CTA NECK FINDINGS Aortic arch: Normal caliber with standard branching. Mild aortic atherosclerosis. No stenosis. Right carotid system: Right common and internal carotid arteries are patent without dissection. Mild atheromatous change about the right carotid bulb without hemodynamically significant stenosis. Left carotid system: Left common and internal carotid arteries are patent without dissection. Mild atheromatous change about the left carotid bulb without hemodynamically significant stenosis. Vertebral arteries: Right vertebral artery dominant and widely patent without significant stenosis or dissection. 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 Left vertebral artery is largely occluded at its origin. Distal reconstitution with irregular attenuated flow within the left V2 and V3 segments. Additional moderate left V3 stenosis noted (series 11, image 159). No acute dissection. Skeleton: No worrisome osseous lesions. Moderate spondylosis at C3-4 through C6-7. Other neck: No other acute finding. Upper chest: No other acute finding. Review of the  MIP images confirms the above findings CTA HEAD FINDINGS  Anterior circulation: Moderate atheromatous change about the carotid siphons without hemodynamically significant stenosis. A1 segments patent bilaterally. Normal anterior communicating artery complex. Anterior cerebral arteries patent without significant stenosis. No M1 stenosis or occlusion. Distal MCA branches perfused and symmetric. Posterior circulation: Dominant right V4 segment widely patent. Right PICA patent at its origin. Left vertebral artery patent as it courses into the cranial vault, but subsequently occludes at takeoff of the left PICA. Left PICA grossly patent at its origin. The basilar patent without stenosis. Superior cerebellar and posterior cerebral arteries patent bilaterally. Venous sinuses: Grossly patent allowing for timing the contrast bolus. Anatomic variants: As above.  No aneurysm. Review of the MIP images confirms the above findings IMPRESSION: CT HEAD: 1. Subcentimeter acute right pontine infarct, better evaluated on prior brain MRI. No acute intracranial hemorrhage or significant mass effect. 2. Underlying age-related cerebral atrophy with moderate chronic microvascular ischemic disease, with chronic left cerebellar infarct. CTA HEAD AND NECK: 1. Negative CTA for acute large vessel occlusion or other emergent finding. 2. 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 Distal reconstitution with irregular attenuated flow within the left V2 and V3 segments. Left vertebral artery subsequently reoccludes at the takeoff of the left PICA. Overall, appearance is similar as compared to prior CTA from 2021. 3. 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 4. Moderate atheromatous change about the carotid siphons without hemodynamically significant stenosis. 5.  Aortic Atherosclerosis (ICD10-I70.0). Electronically Signed   By: Morene Hoard M.D.   On: 04/15/2024 23:02   MR BRAIN WO CONTRAST Result Date: 04/15/2024 CLINICAL DATA:  Provided history:  Neuro deficit, acute, stroke suspected. EXAM: MRI HEAD WITHOUT CONTRAST TECHNIQUE: Multiplanar, multiecho pulse sequences of the brain and surrounding structures were obtained without intravenous contrast. COMPARISON:  Head CT 04/15/2024. Brain MRI 10/29/2019. CTA head/neck 04/15/2024. FINDINGS: Brain: No age-advanced or lobar predominant cerebral atrophy. 8 mm acute infarct within the right aspect of the pons. Unchanged small chronic lacunar infarct within the left aspect of the pons. Background mild pontine chronic small vessel ischemic disease. Few punctate chronic microhemorrhages within the brainstem. Unchanged moderate-sized chronic infarct within the left cerebellar hemisphere. Unchanged chronic lacunar infarct within the right thalamus. Multifocal T2 FLAIR hyperintense signal abnormality within the cerebral white matter, nonspecific but compatible with moderate chronic small vessel ischemic disease. No evidence of an intracranial mass. No extra-axial fluid collection. No midline shift. Vascular: Known occlusion of the left vertebral artery. Flow voids preserved elsewhere within the proximal large arterial vessels. Skull and upper cervical spine: No focal worrisome marrow lesion. Sinuses/Orbits: No mass or acute finding within the imaged orbits. Prior left ocular lens replacement. Minimal mucosal thickening within the bilateral maxillary sinuses. IMPRESSION: 1. 8 mm acute infarct within the right aspect of the pons. 2. Background chronic small vessel ischemic disease and chronic infarcts, as described. 3. 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 4. Known occlusion of the left vertebral artery. Electronically Signed   By: Rockey Childs D.O.   On: 04/15/2024 16:47   CT Head Wo Contrast Result Date: 04/15/2024 EXAM: CT HEAD WITHOUT CONTRAST 04/15/2024 02:53:44 PM TECHNIQUE: CT of the head was performed without the administration of  intravenous contrast. Automated exposure control, iterative reconstruction, and/or weight based adjustment of the mA/kV was utilized to reduce the radiation dose to as low as reasonably achievable. COMPARISON: CT head, CTA head and neck, MRI brain  10/29/2019 CLINICAL HISTORY: Neuro deficit, acute, stroke suspected. Chief complaints; Weakness. FINDINGS: BRAIN AND VENTRICLES: Focal hypoattenuation in the right pons seen on axial image 11 series 2 is new from the prior studies and may reflect an age indeterminate perforator infarct. Old infarct in the left cerebellar hemisphere. Stable background of moderate chronic small vessel disease in the cerebral white matter. Cortical gray white differentiation is otherwise preserved. No acute hemorrhage. No hydrocephalus. No extra-axial collection. No mass effect or midline shift. ORBITS: No acute abnormality. SINUSES: No acute abnormality. SOFT TISSUES AND SKULL: No acute soft tissue abnormality. No skull fracture. Severe degenerative changes of the right greater than left atlanto-occipital joints. IMPRESSION: 1. New focal hypoattenuation in the right pons, possibly representing an age indeterminate perforator infarct. Correlate with symptoms and consider MRI of the brain without contrast for further characterization. 2. Old infarct in the left cerebellar hemisphere. 3. Stable background of moderate chronic small vessel disease in the cerebral white matter. Electronically signed by: Ryan Chess MD 04/15/2024 03:16 PM EDT RP Workstation: [id number]       The results of significant diagnostics from this hospitalization (including imaging, microbiology, ancillary and laboratory) are listed below for reference.     Microbiology: No results found for this or any previous visit (from the past 240 hours).   Labs:  CBC: Recent Labs  Lab 04/15/24 1215 04/16/24 0303  WBC 6.3 5.5  NEUTROABS 3.9 3.0  HGB 13.7 13.6  HCT 43.7 42.9  MCV 91.6 90.3  PLT 206 198    BMP &GFR Recent Labs  Lab 04/15/24 1215 04/16/24 0303  NA 141 139  K 4.0 3.9  CL 108 109  CO2 24 21*  GLUCOSE 108* 91  BUN 8 11  CREATININE 1.04 1.15  CALCIUM  8.6* 8.6*  MG  --  2.0   Estimated Creatinine Clearance: 52.1 mL/min (by C-G formula based on SCr of 1.15 mg/dL). Liver & Pancreas: Recent Labs  Lab 04/15/24 1215 04/16/24 0303  AST 20 21  ALT 19 19  ALKPHOS 42 42  BILITOT 1.3* 1.0  PROT 6.9 6.6  ALBUMIN 3.9 3.8   No results for input(s): LIPASE, AMYLASE in the last 168 hours. No results for input(s): AMMONIA in the last 168 hours. Diabetic: No results for input(s): HGBA1C in the last 72 hours. Recent Labs  Lab 04/16/24 1129 04/16/24 1607 04/16/24 2032 04/17/24 0607 04/17/24 0808  GLUCAP 141* 110* 103* 117* 114*   Cardiac Enzymes: Recent Labs  Lab 04/15/24 1215  CKTOTAL 165   No results for input(s): PROBNP in the last 8760 hours. Coagulation Profile: No results for input(s): INR, PROTIME in the last 168 hours. Thyroid  Function Tests: Recent Labs    04/16/24 0303  TSH 3.635   Lipid Profile: Recent Labs    04/16/24 0303  CHOL 128  HDL 45  LDLCALC 62  TRIG 105  CHOLHDL 2.8   Anemia Panel: No results for input(s): VITAMINB12, FOLATE, FERRITIN, TIBC, IRON, RETICCTPCT in the last 72 hours. Urine analysis:    Component Value Date/Time   COLORURINE YELLOW 04/15/2024 1215   APPEARANCEUR CLEAR 04/15/2024 1215   LABSPEC 1.012 04/15/2024 1215   PHURINE 6.0 04/15/2024 1215   GLUCOSEU NEGATIVE 04/15/2024 1215   HGBUR NEGATIVE 04/15/2024 1215   BILIRUBINUR NEGATIVE 04/15/2024 1215   BILIRUBINUR negative 12/28/2019 1550   KETONESUR NEGATIVE 04/15/2024 1215   PROTEINUR NEGATIVE 04/15/2024 1215   UROBILINOGEN 1.0 12/28/2019 1550   NITRITE NEGATIVE 04/15/2024 1215   LEUKOCYTESUR NEGATIVE 04/15/2024 1215  Sepsis Labs: Invalid input(s): PROCALCITONIN, LACTICIDVEN   SIGNED:  Donesha Wallander T Cerrone Debold, MD  [facility] 04/17/2024, 12:49 PM

## 2024-04-17 NOTE — Evaluation (Signed)
 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) 11/13/2019   DM2 (diabetes mellitus, type 2) (HCC) 11/09/2019   Acute CVA (cerebrovascular accident) (HCC) 10/29/2019   Fever due to COVID-19 10/12/2019   Acute bilateral low back pain without sciatica 05/06/2019   Poison ivy dermatitis 02/04/2019   Erectile dysfunction 06/29/2018   Encounter for well adult exam with abnormal findings 12/21/2017   Enlarged prostate on rectal examination 12/21/2017   Unilateral primary osteoarthritis, left knee 12/21/2017   Past Medical History:  Past Medical History:  Diagnosis Date   Bilateral primary osteoarthritis of knee    DM2 (diabetes mellitus, type 2) (HCC)    Hypertension    Peripheral artery disease (HCC)    Sickle cell trait (HCC)    Past Surgical History: History reviewed. No pertinent surgical history. HPI:  Aaron Mendez is a 78 y.o. 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   MRI of the brain was showing 8mm acute infarct in the right aspect of the pons.   Assessment / Plan / Recommendation Clinical Impression  Cognitive/linguistic evaluation and motor speech screen  were completed.  Cranial nerve exam was completed and unremarkable.  Lingual, labial, facial and jaw range of motion and strength appeared to be adequate.  Facial sensation appeared to be intact and he did not endorse a difference in sensation between the right and left side of his face.  Speech was clear and easy to understand.  No disccernible dysarthria or apraxia were noted.  Portions of the Mini Mental State Exam were completed.  He achieved an overall score of 23/25.  He was fully oriented to person, place, time and situation.  He had good immediate recall of 3 novel words.  However, given a short delay was only able to independently recall 2/3 words.  Use of semantic cue facilitated recall of third novel word.  Language skills were grossly intact.  He was able to name objects, repeat a short sentence, follow a 3 step command and write a sentence.  Mild deficit noted for design copy.  Informally he was able to provide logical solutions to simple problems.  During the clock drawing task all numbers were present and he was able to place the hands on the clock at the directed time.  Mild spacing issues noted and he did not place the circle on the page.  Given results of this evaluation ST follow up is not indicated.  ST to sign off.  If we can be of further assistance please feel free to reconsult.    SLP Assessment  SLP Recommendation/Assessment: Patient does not need any further Speech Language Pathology Services     Assistance Recommended at Discharge  None  Functional Status Assessment Patient has not had a recent decline in  their functional status        SLP Evaluation Cognition  Overall Cognitive Status: Within Functional Limits for tasks assessed Arousal/Alertness: Awake/alert Orientation Level: Oriented X4 Attention:  (Unable to formally assess.) Memory: Impaired Memory Impairment: Decreased recall of new information Awareness: Appears intact Problem Solving: Appears  intact Safety/Judgment: Appears intact       Comprehension  Auditory Comprehension Overall Auditory Comprehension: Appears within functional limits for tasks assessed Commands: Within Functional Limits Conversation: Complex Reading Comprehension Reading Status: Within funtional limits    Expression Expression Primary Mode of Expression: Verbal Verbal Expression Overall Verbal Expression: Appears within functional limits for tasks assessed Initiation: No impairment Automatic Speech: Name;Social Response Level of Generative/Spontaneous Verbalization: Conversation Repetition: No impairment Naming: No impairment Pragmatics: No impairment Non-Verbal Means of Communication: Not applicable Written Expression Dominant Hand: Right Written Expression: Within Functional Limits   Oral / Motor  Oral Motor/Sensory Function Overall Oral Motor/Sensory Function: Within functional limits Motor Speech Overall Motor Speech: Appears within functional limits for tasks assessed Respiration: Within functional limits Phonation: Normal Resonance: Within functional limits Articulation: Within functional limitis Intelligibility: Intelligible Motor Planning: Within functional limits Motor Speech Errors: Not applicable            Eleanor Eagles, MA, CCC-SLP Acute Rehab SLP [phone]  Eleanor LOISE Eagles 04/17/2024, 11:26 AM

## 2024-04-17 NOTE — Progress Notes (Signed)
 RNCM received DME and Home Health orders for patient.  RNCM met with patient at bedside.  Patient offered choice for services and no preference.  Ada at [facility] contacted with DME order-RW- and confirmation received.  RW to be delivered to patient prior to discharge.   Joane at [facility] contacted with Home Health order, PT and OT, and confirmation received.  Patient in agreement to services.

## 2024-04-17 NOTE — Progress Notes (Signed)
 DISCHARGE NOTE HOME Aaron Mendez to be discharged Home per MD order. Discussed prescriptions and follow up appointments with the patient. Prescriptions given to patient; medication list explained in detail. Patient verbalized understanding.  Skin clean, dry and intact without evidence of skin break down, no evidence of skin tears noted. IV catheter discontinued intact. Site without signs and symptoms of complications. Dressing and pressure applied. Pt denies pain at the site currently. No complaints noted.  Patient free of lines, drains, and wounds.   An After Visit Summary (AVS) was printed and given to the patient. Patient escorted via wheelchair, and discharged home via private auto.  Patient insisted on driving self home.  Peyton SHAUNNA Pepper, RN

## 2024-04-18 ENCOUNTER — Telehealth: Payer: Self-pay | Admitting: *Deleted

## 2024-04-18 DIAGNOSIS — Z79899 Other long term (current) drug therapy: Secondary | ICD-10-CM | POA: Diagnosis not present

## 2024-04-18 DIAGNOSIS — E782 Mixed hyperlipidemia: Secondary | ICD-10-CM | POA: Diagnosis not present

## 2024-04-18 DIAGNOSIS — I6350 Cerebral infarction due to unspecified occlusion or stenosis of unspecified cerebral artery: Secondary | ICD-10-CM | POA: Diagnosis not present

## 2024-04-18 DIAGNOSIS — I639 Cerebral infarction, unspecified: Secondary | ICD-10-CM | POA: Diagnosis not present

## 2024-04-18 DIAGNOSIS — I10 Essential (primary) hypertension: Secondary | ICD-10-CM | POA: Diagnosis not present

## 2024-04-18 DIAGNOSIS — R001 Bradycardia, unspecified: Secondary | ICD-10-CM | POA: Diagnosis not present

## 2024-04-18 LAB — HEMOGLOBIN A1C
Hgb A1c MFr Bld: 5.1 % (ref 4.8–5.6)
Mean Plasma Glucose: 100 mg/dL

## 2024-04-18 NOTE — Telephone Encounter (Signed)
 Pt daughter Lauri) called regarding discharge instructions.  RNCM printed AVS and reviewed key points (where to pick up Rx, new Rx and setting up follow-up appointment) with Nichole.  Faye repeated instructions to indicate her understanding.  No additional RNCM needs identified.  Rhiannan Kievit J. Debarah, BSN, RN, [facility]  IP Care Management  Nurse Case Manager  [facility] Emergency Departments  Operative Services  [phone]

## 2024-04-19 DIAGNOSIS — I69354 Hemiplegia and hemiparesis following cerebral infarction affecting left non-dominant side: Secondary | ICD-10-CM | POA: Diagnosis not present

## 2024-04-19 DIAGNOSIS — M1990 Unspecified osteoarthritis, unspecified site: Secondary | ICD-10-CM | POA: Diagnosis not present

## 2024-04-19 DIAGNOSIS — Z7902 Long term (current) use of antithrombotics/antiplatelets: Secondary | ICD-10-CM | POA: Diagnosis not present

## 2024-04-19 DIAGNOSIS — G8929 Other chronic pain: Secondary | ICD-10-CM | POA: Diagnosis not present

## 2024-04-19 DIAGNOSIS — Z7982 Long term (current) use of aspirin: Secondary | ICD-10-CM | POA: Diagnosis not present

## 2024-04-19 DIAGNOSIS — E119 Type 2 diabetes mellitus without complications: Secondary | ICD-10-CM | POA: Diagnosis not present

## 2024-04-19 DIAGNOSIS — I10 Essential (primary) hypertension: Secondary | ICD-10-CM | POA: Diagnosis not present

## 2024-04-19 DIAGNOSIS — I700 Atherosclerosis of aorta: Secondary | ICD-10-CM | POA: Diagnosis not present

## 2024-04-19 DIAGNOSIS — N390 Urinary tract infection, site not specified: Secondary | ICD-10-CM | POA: Diagnosis not present

## 2024-04-20 DIAGNOSIS — E119 Type 2 diabetes mellitus without complications: Secondary | ICD-10-CM | POA: Diagnosis not present

## 2024-04-20 DIAGNOSIS — I700 Atherosclerosis of aorta: Secondary | ICD-10-CM | POA: Diagnosis not present

## 2024-04-20 DIAGNOSIS — M1990 Unspecified osteoarthritis, unspecified site: Secondary | ICD-10-CM | POA: Diagnosis not present

## 2024-04-20 DIAGNOSIS — Z7982 Long term (current) use of aspirin: Secondary | ICD-10-CM | POA: Diagnosis not present

## 2024-04-20 DIAGNOSIS — Z7902 Long term (current) use of antithrombotics/antiplatelets: Secondary | ICD-10-CM | POA: Diagnosis not present

## 2024-04-20 DIAGNOSIS — I10 Essential (primary) hypertension: Secondary | ICD-10-CM | POA: Diagnosis not present

## 2024-04-20 DIAGNOSIS — N390 Urinary tract infection, site not specified: Secondary | ICD-10-CM | POA: Diagnosis not present

## 2024-04-20 DIAGNOSIS — I69354 Hemiplegia and hemiparesis following cerebral infarction affecting left non-dominant side: Secondary | ICD-10-CM | POA: Diagnosis not present

## 2024-04-20 DIAGNOSIS — G8929 Other chronic pain: Secondary | ICD-10-CM | POA: Diagnosis not present

## 2024-04-28 DIAGNOSIS — Z7902 Long term (current) use of antithrombotics/antiplatelets: Secondary | ICD-10-CM | POA: Diagnosis not present

## 2024-04-28 DIAGNOSIS — M1990 Unspecified osteoarthritis, unspecified site: Secondary | ICD-10-CM | POA: Diagnosis not present

## 2024-04-28 DIAGNOSIS — I10 Essential (primary) hypertension: Secondary | ICD-10-CM | POA: Diagnosis not present

## 2024-04-28 DIAGNOSIS — Z7982 Long term (current) use of aspirin: Secondary | ICD-10-CM | POA: Diagnosis not present

## 2024-04-28 DIAGNOSIS — I69354 Hemiplegia and hemiparesis following cerebral infarction affecting left non-dominant side: Secondary | ICD-10-CM | POA: Diagnosis not present

## 2024-04-28 DIAGNOSIS — G8929 Other chronic pain: Secondary | ICD-10-CM | POA: Diagnosis not present

## 2024-04-28 DIAGNOSIS — N390 Urinary tract infection, site not specified: Secondary | ICD-10-CM | POA: Diagnosis not present

## 2024-04-28 DIAGNOSIS — E119 Type 2 diabetes mellitus without complications: Secondary | ICD-10-CM | POA: Diagnosis not present

## 2024-04-28 DIAGNOSIS — I700 Atherosclerosis of aorta: Secondary | ICD-10-CM | POA: Diagnosis not present

## 2024-05-02 DIAGNOSIS — Z7982 Long term (current) use of aspirin: Secondary | ICD-10-CM | POA: Diagnosis not present

## 2024-05-02 DIAGNOSIS — Z7902 Long term (current) use of antithrombotics/antiplatelets: Secondary | ICD-10-CM | POA: Diagnosis not present

## 2024-05-02 DIAGNOSIS — E119 Type 2 diabetes mellitus without complications: Secondary | ICD-10-CM | POA: Diagnosis not present

## 2024-05-02 DIAGNOSIS — I700 Atherosclerosis of aorta: Secondary | ICD-10-CM | POA: Diagnosis not present

## 2024-05-02 DIAGNOSIS — N390 Urinary tract infection, site not specified: Secondary | ICD-10-CM | POA: Diagnosis not present

## 2024-05-02 DIAGNOSIS — I10 Essential (primary) hypertension: Secondary | ICD-10-CM | POA: Diagnosis not present

## 2024-05-02 DIAGNOSIS — M1990 Unspecified osteoarthritis, unspecified site: Secondary | ICD-10-CM | POA: Diagnosis not present

## 2024-05-02 DIAGNOSIS — I69354 Hemiplegia and hemiparesis following cerebral infarction affecting left non-dominant side: Secondary | ICD-10-CM | POA: Diagnosis not present

## 2024-05-02 DIAGNOSIS — G8929 Other chronic pain: Secondary | ICD-10-CM | POA: Diagnosis not present

## 2024-05-04 DIAGNOSIS — Z79899 Other long term (current) drug therapy: Secondary | ICD-10-CM | POA: Diagnosis not present

## 2024-05-04 DIAGNOSIS — Z23 Encounter for immunization: Secondary | ICD-10-CM | POA: Diagnosis not present

## 2024-05-04 DIAGNOSIS — I639 Cerebral infarction, unspecified: Secondary | ICD-10-CM | POA: Diagnosis not present

## 2024-05-04 DIAGNOSIS — I10 Essential (primary) hypertension: Secondary | ICD-10-CM | POA: Diagnosis not present

## 2024-05-04 DIAGNOSIS — I6350 Cerebral infarction due to unspecified occlusion or stenosis of unspecified cerebral artery: Secondary | ICD-10-CM | POA: Diagnosis not present

## 2024-05-04 DIAGNOSIS — R001 Bradycardia, unspecified: Secondary | ICD-10-CM | POA: Diagnosis not present

## 2024-05-05 DIAGNOSIS — G8929 Other chronic pain: Secondary | ICD-10-CM | POA: Diagnosis not present

## 2024-05-05 DIAGNOSIS — Z7902 Long term (current) use of antithrombotics/antiplatelets: Secondary | ICD-10-CM | POA: Diagnosis not present

## 2024-05-05 DIAGNOSIS — I69354 Hemiplegia and hemiparesis following cerebral infarction affecting left non-dominant side: Secondary | ICD-10-CM | POA: Diagnosis not present

## 2024-05-05 DIAGNOSIS — E119 Type 2 diabetes mellitus without complications: Secondary | ICD-10-CM | POA: Diagnosis not present

## 2024-05-05 DIAGNOSIS — I700 Atherosclerosis of aorta: Secondary | ICD-10-CM | POA: Diagnosis not present

## 2024-05-05 DIAGNOSIS — I10 Essential (primary) hypertension: Secondary | ICD-10-CM | POA: Diagnosis not present

## 2024-05-05 DIAGNOSIS — M1990 Unspecified osteoarthritis, unspecified site: Secondary | ICD-10-CM | POA: Diagnosis not present

## 2024-05-05 DIAGNOSIS — N390 Urinary tract infection, site not specified: Secondary | ICD-10-CM | POA: Diagnosis not present

## 2024-05-05 DIAGNOSIS — Z7982 Long term (current) use of aspirin: Secondary | ICD-10-CM | POA: Diagnosis not present

## 2024-05-10 DIAGNOSIS — Z7902 Long term (current) use of antithrombotics/antiplatelets: Secondary | ICD-10-CM | POA: Diagnosis not present

## 2024-05-10 DIAGNOSIS — Z1339 Encounter for screening examination for other mental health and behavioral disorders: Secondary | ICD-10-CM | POA: Diagnosis not present

## 2024-05-10 DIAGNOSIS — G8929 Other chronic pain: Secondary | ICD-10-CM | POA: Diagnosis not present

## 2024-05-10 DIAGNOSIS — Z79899 Other long term (current) drug therapy: Secondary | ICD-10-CM | POA: Diagnosis not present

## 2024-05-10 DIAGNOSIS — I700 Atherosclerosis of aorta: Secondary | ICD-10-CM | POA: Diagnosis not present

## 2024-05-10 DIAGNOSIS — M1990 Unspecified osteoarthritis, unspecified site: Secondary | ICD-10-CM | POA: Diagnosis not present

## 2024-05-10 DIAGNOSIS — I69354 Hemiplegia and hemiparesis following cerebral infarction affecting left non-dominant side: Secondary | ICD-10-CM | POA: Diagnosis not present

## 2024-05-10 DIAGNOSIS — Z7982 Long term (current) use of aspirin: Secondary | ICD-10-CM | POA: Diagnosis not present

## 2024-05-10 DIAGNOSIS — N390 Urinary tract infection, site not specified: Secondary | ICD-10-CM | POA: Diagnosis not present

## 2024-05-10 DIAGNOSIS — I6350 Cerebral infarction due to unspecified occlusion or stenosis of unspecified cerebral artery: Secondary | ICD-10-CM | POA: Diagnosis not present

## 2024-05-10 DIAGNOSIS — Z1331 Encounter for screening for depression: Secondary | ICD-10-CM | POA: Diagnosis not present

## 2024-05-10 DIAGNOSIS — I10 Essential (primary) hypertension: Secondary | ICD-10-CM | POA: Diagnosis not present

## 2024-05-10 DIAGNOSIS — E119 Type 2 diabetes mellitus without complications: Secondary | ICD-10-CM | POA: Diagnosis not present

## 2024-05-10 DIAGNOSIS — Z0000 Encounter for general adult medical examination without abnormal findings: Secondary | ICD-10-CM | POA: Diagnosis not present

## 2024-05-13 DIAGNOSIS — Z7982 Long term (current) use of aspirin: Secondary | ICD-10-CM | POA: Diagnosis not present

## 2024-05-13 DIAGNOSIS — G8929 Other chronic pain: Secondary | ICD-10-CM | POA: Diagnosis not present

## 2024-05-13 DIAGNOSIS — Z7902 Long term (current) use of antithrombotics/antiplatelets: Secondary | ICD-10-CM | POA: Diagnosis not present

## 2024-05-13 DIAGNOSIS — I10 Essential (primary) hypertension: Secondary | ICD-10-CM | POA: Diagnosis not present

## 2024-05-13 DIAGNOSIS — I700 Atherosclerosis of aorta: Secondary | ICD-10-CM | POA: Diagnosis not present

## 2024-05-13 DIAGNOSIS — I69354 Hemiplegia and hemiparesis following cerebral infarction affecting left non-dominant side: Secondary | ICD-10-CM | POA: Diagnosis not present

## 2024-05-13 DIAGNOSIS — E119 Type 2 diabetes mellitus without complications: Secondary | ICD-10-CM | POA: Diagnosis not present

## 2024-05-13 DIAGNOSIS — N390 Urinary tract infection, site not specified: Secondary | ICD-10-CM | POA: Diagnosis not present

## 2024-05-13 DIAGNOSIS — M1990 Unspecified osteoarthritis, unspecified site: Secondary | ICD-10-CM | POA: Diagnosis not present

## 2024-05-17 DIAGNOSIS — I69354 Hemiplegia and hemiparesis following cerebral infarction affecting left non-dominant side: Secondary | ICD-10-CM | POA: Diagnosis not present

## 2024-05-17 DIAGNOSIS — I700 Atherosclerosis of aorta: Secondary | ICD-10-CM | POA: Diagnosis not present

## 2024-05-17 DIAGNOSIS — E119 Type 2 diabetes mellitus without complications: Secondary | ICD-10-CM | POA: Diagnosis not present

## 2024-05-17 DIAGNOSIS — M1990 Unspecified osteoarthritis, unspecified site: Secondary | ICD-10-CM | POA: Diagnosis not present

## 2024-05-17 DIAGNOSIS — I10 Essential (primary) hypertension: Secondary | ICD-10-CM | POA: Diagnosis not present

## 2024-05-17 DIAGNOSIS — G8929 Other chronic pain: Secondary | ICD-10-CM | POA: Diagnosis not present

## 2024-05-17 DIAGNOSIS — Z7902 Long term (current) use of antithrombotics/antiplatelets: Secondary | ICD-10-CM | POA: Diagnosis not present

## 2024-05-17 DIAGNOSIS — Z7982 Long term (current) use of aspirin: Secondary | ICD-10-CM | POA: Diagnosis not present

## 2024-05-17 DIAGNOSIS — N390 Urinary tract infection, site not specified: Secondary | ICD-10-CM | POA: Diagnosis not present

## 2024-05-18 DIAGNOSIS — N390 Urinary tract infection, site not specified: Secondary | ICD-10-CM | POA: Diagnosis not present

## 2024-05-18 DIAGNOSIS — Z7185 Encounter for immunization safety counseling: Secondary | ICD-10-CM | POA: Diagnosis not present

## 2024-05-18 DIAGNOSIS — Z23 Encounter for immunization: Secondary | ICD-10-CM | POA: Diagnosis not present

## 2024-05-18 DIAGNOSIS — N3000 Acute cystitis without hematuria: Secondary | ICD-10-CM | POA: Diagnosis not present

## 2024-05-25 DIAGNOSIS — Z7982 Long term (current) use of aspirin: Secondary | ICD-10-CM | POA: Diagnosis not present

## 2024-05-25 DIAGNOSIS — M1990 Unspecified osteoarthritis, unspecified site: Secondary | ICD-10-CM | POA: Diagnosis not present

## 2024-05-25 DIAGNOSIS — N390 Urinary tract infection, site not specified: Secondary | ICD-10-CM | POA: Diagnosis not present

## 2024-05-25 DIAGNOSIS — E119 Type 2 diabetes mellitus without complications: Secondary | ICD-10-CM | POA: Diagnosis not present

## 2024-05-25 DIAGNOSIS — G8929 Other chronic pain: Secondary | ICD-10-CM | POA: Diagnosis not present

## 2024-05-25 DIAGNOSIS — I10 Essential (primary) hypertension: Secondary | ICD-10-CM | POA: Diagnosis not present

## 2024-05-25 DIAGNOSIS — I700 Atherosclerosis of aorta: Secondary | ICD-10-CM | POA: Diagnosis not present

## 2024-05-25 DIAGNOSIS — Z7902 Long term (current) use of antithrombotics/antiplatelets: Secondary | ICD-10-CM | POA: Diagnosis not present

## 2024-05-25 DIAGNOSIS — I69354 Hemiplegia and hemiparesis following cerebral infarction affecting left non-dominant side: Secondary | ICD-10-CM | POA: Diagnosis not present

## 2024-05-31 DIAGNOSIS — Z7982 Long term (current) use of aspirin: Secondary | ICD-10-CM | POA: Diagnosis not present

## 2024-05-31 DIAGNOSIS — N39490 Overflow incontinence: Secondary | ICD-10-CM | POA: Diagnosis not present

## 2024-05-31 DIAGNOSIS — I639 Cerebral infarction, unspecified: Secondary | ICD-10-CM | POA: Diagnosis not present

## 2024-05-31 DIAGNOSIS — M5410 Radiculopathy, site unspecified: Secondary | ICD-10-CM | POA: Diagnosis not present

## 2024-05-31 DIAGNOSIS — N3281 Overactive bladder: Secondary | ICD-10-CM | POA: Diagnosis not present

## 2024-05-31 DIAGNOSIS — I700 Atherosclerosis of aorta: Secondary | ICD-10-CM | POA: Diagnosis not present

## 2024-05-31 DIAGNOSIS — N3000 Acute cystitis without hematuria: Secondary | ICD-10-CM | POA: Diagnosis not present

## 2024-05-31 DIAGNOSIS — I6350 Cerebral infarction due to unspecified occlusion or stenosis of unspecified cerebral artery: Secondary | ICD-10-CM | POA: Diagnosis not present

## 2024-05-31 DIAGNOSIS — I69354 Hemiplegia and hemiparesis following cerebral infarction affecting left non-dominant side: Secondary | ICD-10-CM | POA: Diagnosis not present

## 2024-05-31 DIAGNOSIS — E1165 Type 2 diabetes mellitus with hyperglycemia: Secondary | ICD-10-CM | POA: Diagnosis not present

## 2024-05-31 DIAGNOSIS — I10 Essential (primary) hypertension: Secondary | ICD-10-CM | POA: Diagnosis not present

## 2024-05-31 DIAGNOSIS — G8929 Other chronic pain: Secondary | ICD-10-CM | POA: Diagnosis not present

## 2024-05-31 DIAGNOSIS — M1990 Unspecified osteoarthritis, unspecified site: Secondary | ICD-10-CM | POA: Diagnosis not present

## 2024-05-31 DIAGNOSIS — E119 Type 2 diabetes mellitus without complications: Secondary | ICD-10-CM | POA: Diagnosis not present

## 2024-05-31 DIAGNOSIS — Z7902 Long term (current) use of antithrombotics/antiplatelets: Secondary | ICD-10-CM | POA: Diagnosis not present

## 2024-05-31 DIAGNOSIS — N390 Urinary tract infection, site not specified: Secondary | ICD-10-CM | POA: Diagnosis not present

## 2024-05-31 DIAGNOSIS — M545 Low back pain, unspecified: Secondary | ICD-10-CM | POA: Diagnosis not present

## 2024-06-14 DIAGNOSIS — M48061 Spinal stenosis, lumbar region without neurogenic claudication: Secondary | ICD-10-CM | POA: Diagnosis not present

## 2024-06-15 DIAGNOSIS — M1990 Unspecified osteoarthritis, unspecified site: Secondary | ICD-10-CM | POA: Diagnosis not present

## 2024-06-15 DIAGNOSIS — I10 Essential (primary) hypertension: Secondary | ICD-10-CM | POA: Diagnosis not present

## 2024-06-15 DIAGNOSIS — Z7902 Long term (current) use of antithrombotics/antiplatelets: Secondary | ICD-10-CM | POA: Diagnosis not present

## 2024-06-15 DIAGNOSIS — I69354 Hemiplegia and hemiparesis following cerebral infarction affecting left non-dominant side: Secondary | ICD-10-CM | POA: Diagnosis not present

## 2024-06-15 DIAGNOSIS — I700 Atherosclerosis of aorta: Secondary | ICD-10-CM | POA: Diagnosis not present

## 2024-06-15 DIAGNOSIS — G8929 Other chronic pain: Secondary | ICD-10-CM | POA: Diagnosis not present

## 2024-06-15 DIAGNOSIS — Z7982 Long term (current) use of aspirin: Secondary | ICD-10-CM | POA: Diagnosis not present

## 2024-06-15 DIAGNOSIS — E119 Type 2 diabetes mellitus without complications: Secondary | ICD-10-CM | POA: Diagnosis not present

## 2024-06-28 DIAGNOSIS — N3281 Overactive bladder: Secondary | ICD-10-CM | POA: Diagnosis not present

## 2024-06-28 DIAGNOSIS — N39490 Overflow incontinence: Secondary | ICD-10-CM | POA: Diagnosis not present

## 2024-06-28 DIAGNOSIS — I639 Cerebral infarction, unspecified: Secondary | ICD-10-CM | POA: Diagnosis not present

## 2024-06-28 DIAGNOSIS — I10 Essential (primary) hypertension: Secondary | ICD-10-CM | POA: Diagnosis not present

## 2024-06-28 DIAGNOSIS — M545 Low back pain, unspecified: Secondary | ICD-10-CM | POA: Diagnosis not present

## 2024-07-07 DIAGNOSIS — N39490 Overflow incontinence: Secondary | ICD-10-CM | POA: Diagnosis not present

## 2024-07-07 DIAGNOSIS — M545 Low back pain, unspecified: Secondary | ICD-10-CM | POA: Diagnosis not present

## 2024-07-07 DIAGNOSIS — N3281 Overactive bladder: Secondary | ICD-10-CM | POA: Diagnosis not present

## 2024-07-07 DIAGNOSIS — G834 Cauda equina syndrome: Secondary | ICD-10-CM | POA: Diagnosis not present

## 2024-07-07 DIAGNOSIS — M4800 Spinal stenosis, site unspecified: Secondary | ICD-10-CM | POA: Diagnosis not present

## 2024-07-20 DIAGNOSIS — Z6825 Body mass index (BMI) 25.0-25.9, adult: Secondary | ICD-10-CM | POA: Diagnosis not present

## 2024-07-20 DIAGNOSIS — M48061 Spinal stenosis, lumbar region without neurogenic claudication: Secondary | ICD-10-CM | POA: Diagnosis not present

## 2024-07-22 DIAGNOSIS — H269 Unspecified cataract: Secondary | ICD-10-CM | POA: Diagnosis not present

## 2024-07-22 DIAGNOSIS — I10 Essential (primary) hypertension: Secondary | ICD-10-CM | POA: Diagnosis not present

## 2024-07-22 DIAGNOSIS — F325 Major depressive disorder, single episode, in full remission: Secondary | ICD-10-CM | POA: Diagnosis not present

## 2024-07-22 DIAGNOSIS — H409 Unspecified glaucoma: Secondary | ICD-10-CM | POA: Diagnosis not present

## 2024-07-22 DIAGNOSIS — E039 Hypothyroidism, unspecified: Secondary | ICD-10-CM | POA: Diagnosis not present

## 2024-07-22 DIAGNOSIS — E785 Hyperlipidemia, unspecified: Secondary | ICD-10-CM | POA: Diagnosis not present

## 2024-07-22 DIAGNOSIS — I951 Orthostatic hypotension: Secondary | ICD-10-CM | POA: Diagnosis not present

## 2024-07-22 DIAGNOSIS — N529 Male erectile dysfunction, unspecified: Secondary | ICD-10-CM | POA: Diagnosis not present

## 2024-07-22 DIAGNOSIS — I69354 Hemiplegia and hemiparesis following cerebral infarction affecting left non-dominant side: Secondary | ICD-10-CM | POA: Diagnosis not present

## 2024-07-22 DIAGNOSIS — H1030 Unspecified acute conjunctivitis, unspecified eye: Secondary | ICD-10-CM | POA: Diagnosis not present

## 2024-07-22 DIAGNOSIS — N3941 Urge incontinence: Secondary | ICD-10-CM | POA: Diagnosis not present

## 2024-07-22 DIAGNOSIS — B0229 Other postherpetic nervous system involvement: Secondary | ICD-10-CM | POA: Diagnosis not present

## 2024-07-22 DIAGNOSIS — Z9989 Dependence on other enabling machines and devices: Secondary | ICD-10-CM | POA: Diagnosis not present

## 2024-07-25 DIAGNOSIS — M545 Low back pain, unspecified: Secondary | ICD-10-CM | POA: Diagnosis not present

## 2024-07-25 DIAGNOSIS — N3281 Overactive bladder: Secondary | ICD-10-CM | POA: Diagnosis not present

## 2024-07-25 DIAGNOSIS — M4800 Spinal stenosis, site unspecified: Secondary | ICD-10-CM | POA: Diagnosis not present

## 2024-07-25 DIAGNOSIS — G834 Cauda equina syndrome: Secondary | ICD-10-CM | POA: Diagnosis not present

## 2024-08-11 ENCOUNTER — Telehealth: Payer: Self-pay | Admitting: Neurology

## 2024-08-11 NOTE — Telephone Encounter (Signed)
 Patient called to verify appointment date and time and address.

## 2024-08-17 ENCOUNTER — Telehealth: Payer: Self-pay | Admitting: Adult Health

## 2024-08-17 NOTE — Telephone Encounter (Signed)
 Appointment details confirmed

## 2024-08-18 ENCOUNTER — Encounter: Payer: Self-pay | Admitting: Neurology

## 2024-08-18 ENCOUNTER — Ambulatory Visit: Admitting: Neurology

## 2024-08-18 VITALS — BP 138/87 | HR 52 | Ht 70.0 in | Wt 190.8 lb

## 2024-08-18 DIAGNOSIS — I6350 Cerebral infarction due to unspecified occlusion or stenosis of unspecified cerebral artery: Secondary | ICD-10-CM

## 2024-08-18 DIAGNOSIS — R29898 Other symptoms and signs involving the musculoskeletal system: Secondary | ICD-10-CM | POA: Diagnosis not present

## 2024-08-18 DIAGNOSIS — I6381 Other cerebral infarction due to occlusion or stenosis of small artery: Secondary | ICD-10-CM

## 2024-08-18 DIAGNOSIS — M48061 Spinal stenosis, lumbar region without neurogenic claudication: Secondary | ICD-10-CM | POA: Insufficient documentation

## 2024-08-18 NOTE — Patient Instructions (Signed)
 I had a long d/w patient about his recent [place] stroke , risk for recurrent stroke/TIAs, personally independently reviewed imaging studies and stroke evaluation results and answered questions.Continue Plavix  75 mg daily  for secondary stroke prevention and maintain strict control of hypertension with blood pressure goal below 130/90, diabetes with hemoglobin A1c goal below 6.5% and lipids with LDL cholesterol goal below 70 mg/dL. I also advised the patient to eat a healthy diet with plenty of whole grains, cereals, fruits and vegetables, exercise regularly and maintain ideal body weight Followup in the future with my nurse practitioner in 6 months or call earlier if needed.  Stroke Prevention Some medical conditions and behaviors can lead to a higher chance of having a stroke. You can help prevent a stroke by eating healthy, exercising, not smoking, and managing any medical conditions you have. Stroke is a leading cause of functional impairment. Primary prevention is particularly important because a majority of strokes are first-time events. Stroke changes the lives of not only those who experience a stroke but also their family and other caregivers. How can this condition affect me? A stroke is a medical emergency and should be treated right away. A stroke can lead to brain damage and can sometimes be life-threatening. If a person gets medical treatment right away, there is a better chance of surviving and recovering from a stroke. What can increase my risk? The following medical conditions may increase your risk of a stroke: Cardiovascular disease. High blood pressure (hypertension). Diabetes. High cholesterol. Sickle cell disease. Blood clotting disorders (hypercoagulable state). Obesity. Sleep disorders (obstructive sleep apnea). Other risk factors include: Being older than age 78. Having a history of blood clots, stroke, or mini-stroke (transient ischemic attack, TIA). Genetic factors,  such as race, ethnicity, or a family history of stroke. Smoking cigarettes or using other tobacco products. Taking birth control pills, especially if you also use tobacco. Heavy use of alcohol  or drugs, especially cocaine and methamphetamine. Physical inactivity. What actions can I take to prevent this? Manage your health conditions High cholesterol levels. Eating a healthy diet is important for preventing high cholesterol. If cholesterol cannot be managed through diet alone, you may need to take medicines. Take any prescribed medicines to control your cholesterol as told by your health care provider. Hypertension. To reduce your risk of stroke, try to keep your blood pressure below 130/80. Eating a healthy diet and exercising regularly are important for controlling blood pressure. If these steps are not enough to manage your blood pressure, you may need to take medicines. Take any prescribed medicines to control hypertension as told by your health care provider. Ask your health care provider if you should monitor your blood pressure at home. Have your blood pressure checked every year, even if your blood pressure is normal. Blood pressure increases with age and some medical conditions. Diabetes. Eating a healthy diet and exercising regularly are important parts of managing your blood sugar (glucose). If your blood sugar cannot be managed through diet and exercise, you may need to take medicines. Take any prescribed medicines to control your diabetes as told by your health care provider. Get evaluated for obstructive sleep apnea. Talk to your health care provider about getting a sleep evaluation if you snore a lot or have excessive sleepiness. Make sure that any other medical conditions you have, such as atrial fibrillation or atherosclerosis, are managed. Nutrition Follow instructions from your health care provider about what to eat or drink to help manage your health condition. These  instructions may include: Reducing your daily calorie intake. Limiting how much salt (sodium) you use to 1,500 milligrams (mg) each day. Using only healthy fats for cooking, such as olive oil, canola oil, or sunflower oil. Eating healthy foods. You can do this by: Choosing foods that are high in fiber, such as whole grains, and fresh fruits and vegetables. Eating at least 5 servings of fruits and vegetables a day. Try to fill one-half of your plate with fruits and vegetables at each meal. Choosing lean protein foods, such as lean cuts of meat, poultry without skin, fish, tofu, beans, and nuts. Eating low-fat dairy products. Avoiding foods that are high in sodium. This can help lower blood pressure. Avoiding foods that have saturated fat, trans fat, and cholesterol. This can help prevent high cholesterol. Avoiding processed and prepared foods. Counting your daily carbohydrate intake.  Lifestyle If you drink alcohol : Limit how much you have to: 0-1 drink a day for women who are not pregnant. 0-2 drinks a day for men. Know how much alcohol  is in your drink. In the [place] one drink equals one 12 oz bottle of beer ( ), one 5 oz glass of wine ( ), or one 1 oz glass of hard liquor (44mL). Do not use any products that contain nicotine or tobacco. These products include cigarettes, chewing tobacco, and vaping devices, such as e-cigarettes. If you need help quitting, ask your health care provider. Avoid secondhand smoke. Do not use drugs. Activity  Try to stay at a healthy weight. Get at least 30 minutes of exercise on most days, such as: Fast walking. Biking. Swimming. Medicines Take over-the-counter and prescription medicines only as told by your health care provider. Aspirin  or blood thinners (antiplatelets or anticoagulants) may be recommended to reduce your risk of forming blood clots that can lead to stroke. Avoid taking birth control pills. Talk to your health care provider about  the risks of taking birth control pills if: You are over 41 years old. You smoke. You get very bad headaches. You have had a blood clot. Where to find more information American Stroke Association: www.strokeassociation.org Get help right away if: You or a loved one has any symptoms of a stroke. BE FAST is an easy way to remember the main warning signs of a stroke: B - Balance. Signs are dizziness, sudden trouble walking, or loss of balance. E - Eyes. Signs are trouble seeing or a sudden change in vision. F - Face. Signs are sudden weakness or numbness of the face, or the face or eyelid drooping on one side. A - Arms. Signs are weakness or numbness in an arm. This happens suddenly and usually on one side of the body. S - Speech. Signs are sudden trouble speaking, slurred speech, or trouble understanding what people say. T - Time. Time to call emergency services. Write down what time symptoms started. You or a loved one has other signs of a stroke, such as: A sudden, severe headache with no known cause. Nausea or vomiting. Seizure. These symptoms may represent a serious problem that is an emergency. Do not wait to see if the symptoms will go away. Get medical help right away. Call your local emergency services (911 in the [place] Do not drive yourself to the hospital. Summary You can help to prevent a stroke by eating healthy, exercising, not smoking, limiting alcohol  intake, and managing any medical conditions you may have. Do not use any products that contain nicotine or tobacco. These include cigarettes, chewing tobacco, and  vaping devices, such as e-cigarettes. If you need help quitting, ask your health care provider. Remember BE FAST for warning signs of a stroke. Get help right away if you or a loved one has any of these signs. This information is not intended to replace advice given to you by your health care provider. Make sure you discuss any questions you have with your health care  provider. Document Revised: 07/28/2022 Document Reviewed: 07/28/2022 Elsevier Patient Education  2024 [employer].

## 2024-08-18 NOTE — Progress Notes (Signed)
 [facility] [address] [place]. [place] [address] [phone]       OFFICE FOLLOW-UP NOTE  Mr. Aaron Mendez Date of Birth:  04/05/1946 Medical Record Number:  [id number]   HPI: Aaron Mendez is a 78 year old African-American male seen today for initial office follow-up visit following hospital consultation for stroke in August 2025.  He is accompanied by his wife.  History is obtained from them and review of electronic medical records.  I have personally reviewed pertinent available imaging films in PACS.  He has past medical history of hypertension, peripheral arterial disease, left cerebellar infarct in 2021, osteoarthritis and sickle cell trait.  He presented on 04/15/2024 with 1 week history of left leg weakness which started abruptly.  Seek medical help for several days.  MRI scan of the brain done in the emergency room showed right paramedian pontine infarct.  CT angiogram of the head and neck showed no large vessel stenosis or occlusion.  Transthoracic echo showed ejection fraction of 60 to 65%.  LDL cholesterol was 62 mg percent.  Hemoglobin A1c was 5.1.  Patient was on aspirin  prior to admission placed on Plavix  for 3 weeks followed by Plavix  alone.  Patient states he done well since discharge.  He has changed home placement therapy.  He still has some residual left hand weakness and dizziness he also has mild weakness of the left leg which corrects when he walks.  He can walk short distances without assistive device but uses a cane for long distances.  He has not had any recurrent stroke or TIA symptoms.  He is tolerating Plavix  well without it.  Tolerating Crestor  without muscle aches and pains.  His blood pressure is under good control and today it is 138/87.  ROS:   14 system review of systems is positive for hand weakness, pain, leg stiffness, difficulty walking and all other systems negative  PMH:  Past Medical History:  Diagnosis Date   Bilateral primary  osteoarthritis of knee    DM2 (diabetes mellitus, type 2) (HCC)    Hypertension    Peripheral artery disease    Sickle cell trait     Social History:  Social History   Socioeconomic History   Marital status: Single    Spouse name: Not on file   Number of children: Not on file   Years of education: Not on file   Highest education level: Not on file  Occupational History   Not on file  Tobacco Use   Smoking status: Never   Smokeless tobacco: Never  Vaping Use   Vaping status: Never Used  Substance and Sexual Activity   Alcohol  use: No   Drug use: No   Sexual activity: Not on file  Other Topics Concern   Not on file  Social History Narrative   Not on file   Social Drivers of Health   Tobacco Use: Low Risk (08/18/2024)   Patient History    Smoking Tobacco Use: Never    Smokeless Tobacco Use: Never    Passive Exposure: Not on file  Financial Resource Strain: Not on file  Food Insecurity: No Food Insecurity (04/16/2024)   Epic    Worried About Programme Researcher, Broadcasting/film/video in the Last Year: Never true    Ran Out of Food in the Last Year: Never true  Transportation Needs: No Transportation Needs (04/16/2024)   Epic    Lack of Transportation (Medical): No    Lack of Transportation (Non-Medical): No  Physical Activity: Not  on file  Stress: Not on file  Social Connections: Unknown (04/16/2024)   Social Connection and Isolation Panel    Frequency of Communication with Friends and Family: Twice a week    Frequency of Social Gatherings with Friends and Family: Twice a week    Attends Religious Services: More than 4 times per year    Active Member of [employer] or Organizations: No    Attends Banker Meetings: Never    Marital Status: Not on file  Intimate Partner Violence: Not At Risk (04/16/2024)   Epic    Fear of Current or Ex-Partner: No    Emotionally Abused: No    Physically Abused: No    Sexually Abused: No  Depression (PHQ2-9): Low Risk (12/12/2021)   Depression  (PHQ2-9)    PHQ-2 Score: 0  Alcohol  Screen: Not on file  Housing: Low Risk (04/16/2024)   Epic    Unable to Pay for Housing in the Last Year: No    Number of Times Moved in the Last Year: 0    Homeless in the Last Year: No  Utilities: Not At Risk (04/16/2024)   Epic    Threatened with loss of utilities: No  Health Literacy: Not on file    Medications:  Medications Ordered Prior to Encounter[1]  Allergies:  Allergies[2]  Physical Exam General: well developed, well nourished pleasant elderly African-American male, seated, in no evident distress Head: head normocephalic and atraumatic.  Neck: supple with no carotid or supraclavicular bruits Cardiovascular: regular rate and rhythm, no murmurs Musculoskeletal: no deformity Skin:  no rash/petichiae Vascular:  Normal pulses all extremities Vitals:   08/18/24 0851  BP: 138/87  Pulse: (!) 52  SpO2: 98%   Neurologic Exam Mental Status: Awake and fully alert. Oriented to place and time. Recent and remote memory intact. Attention span, concentration and fund of knowledge appropriate. Mood and affect appropriate.  Cranial Nerves: Fundoscopic exam reveals sharp disc margins. Pupils equal, briskly reactive to light. Extraocular movements full without nystagmus. Visual fields full to confrontation. Hearing intact. Facial sensation intact. Face, tongue, palate moves normally and symmetrically.  Motor: Normal bulk and tone. Normal strength in all tested extremity muscles.  Weakness of left grip and intrinsic hand muscles.  Orbits right over left upper extremity.  Mild weakness of left hip flexors and ankle dorsiflexors.  Tone is increased in the left leg. Sensory.: intact to touch ,pinprick .position and vibratory sensation.  Coordination: Rapid alternating movements normal in all extremities. Finger-to-nose and heel-to-shin performed accurately bilaterally. Gait and Station: Arises from chair without difficulty. Stance is normal. Gait demonstrates  slight dragging and stiffness of the left leg.. Able to heel, toe and tandem walk without difficulty.  Reflexes: 1+ and symmetric. Toes downgoing.   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  He is doing reasonably well with only mild left.  Vascular risk factors of hypertension and hyperlipidemia only.     PLAN:I had a long d/w patient about his recent Pontine stroke , risk for recurrent stroke/TIAs, personally independently reviewed imaging studies and stroke evaluation results and answered questions.Continue Plavix  75 mg daily  for secondary stroke prevention and maintain strict control of hypertension with blood pressure goal below 130/90, diabetes with hemoglobin A1c goal below 6.5% and lipids with LDL cholesterol goal below 70 mg/dL. I also advised the patient to eat a healthy diet with plenty of whole grains,  cereals, fruits and vegetables, exercise regularly and maintain ideal body weight Followup in the future with my nurse practitioner in 6 months or call earlier if needed.    I personally spent a total of 35 minutes in the care of the patient today including getting/reviewing separately obtained history, performing a medically appropriate exam/evaluation, counseling and educating, placing orders, referring and communicating with other health care professionals, documenting clinical information in the EHR, independently interpreting results, and coordinating care.        Eather Popp, MD Note: This document was prepared with digital dictation and possible smart phrase technology. Any transcriptional errors that result from this process are unintentional     [1]  Current Outpatient Medications on File Prior to Visit  Medication Sig Dispense Refill   latanoprost  (XALATAN ) 0.005 % ophthalmic solution Place 1 drop into both eyes every evening.     Multiple Vitamin  (MULTIVITAMIN WITH MINERALS) TABS tablet Take 1 tablet by mouth daily.     mupirocin  cream (BACTROBAN ) 2 % Apply 1 Application topically 2 (two) times daily. (Patient taking differently: Apply 1 Application topically daily as needed (rash).) 15 g 0   rosuvastatin  (CRESTOR ) 10 MG tablet TAKE 1 TABLET(10 MG) BY MOUTH DAILY (Patient taking differently: Take 10 mg by mouth every evening.) 90 tablet 1   valsartan (DIOVAN) 80 MG tablet Take 80 mg by mouth daily.     Vitamin D, Ergocalciferol, (DRISDOL) 1.25 MG (50000 UNIT) CAPS capsule Take 50,000 Units by mouth once a week.     Accu-Chek Softclix Lancets lancets Use to check glucose up to two times per day.  Dx: E11.9 (Patient not taking: Reported on 08/18/2024) 100 each 12   Blood Glucose Monitoring Suppl (ACCU-CHEK AVIVA PLUS) w/Device KIT Use to check glucose up to two times per day.  Dx: E11.9 (Patient not taking: Reported on 08/18/2024) 1 kit 0   clopidogrel  (PLAVIX ) 75 MG tablet Take 1 tablet (75 mg total) by mouth daily. (Patient not taking: Reported on 08/18/2024) 90 tablet 3   DULoxetine (CYMBALTA) 20 MG capsule      glucose blood (ACCU-CHEK AVIVA PLUS) test strip Use to check glucose up to two times per day.  Dx: E11.9 (Patient not taking: Reported on 08/18/2024) 100 each 12   meloxicam  (MOBIC ) 7.5 MG tablet TAKE 2 TABLETS BY MOUTH DAILY AS NEEDED FOR PAIN. TAKE ONCE YOU'VE COMPLETED STEROID PACK (Patient not taking: Reported on 08/18/2024) 180 tablet 2   moxifloxacin (VIGAMOX) 0.5 % ophthalmic solution      sildenafil (VIAGRA) 100 MG tablet Take 100 mg by mouth daily as needed for erectile dysfunction. (Patient not taking: Reported on 08/18/2024)     timolol (BETIMOL) 0.5 % ophthalmic solution      traMADol  (ULTRAM ) 50 MG tablet Take 1 tablet (50 mg total) by mouth 3 (three) times daily as needed. (Patient not taking: Reported on 08/18/2024) 30 tablet 0   triamcinolone  cream (KENALOG ) 0.1 % Apply 1 Application topically 2 (two) times daily as  needed (itching). Apply to arms, trunk. May apply sparingly to the neck. Do not apply to face (Patient not taking: Reported on 08/18/2024) 80 g 0   No current facility-administered medications on file prior to visit.  [2] No Known Allergies

## 2025-03-07 ENCOUNTER — Ambulatory Visit: Admitting: Adult Health

## 2025-03-14 ENCOUNTER — Ambulatory Visit: Admitting: Adult Health
